# Patient Record
Sex: Male | Born: 1985 | Race: Black or African American | Hispanic: No | Marital: Married | Smoking: Current every day smoker
Health system: Southern US, Community
[De-identification: ages and names within clinical notes are randomized; demographics above are authoritative.]

## PROBLEM LIST (undated history)

## (undated) DIAGNOSIS — I10 Essential (primary) hypertension: Secondary | ICD-10-CM

## (undated) DIAGNOSIS — I509 Heart failure, unspecified: Secondary | ICD-10-CM

## (undated) DIAGNOSIS — G4733 Obstructive sleep apnea (adult) (pediatric): Secondary | ICD-10-CM

## (undated) HISTORY — DX: Morbid (severe) obesity due to excess calories: E66.01

## (undated) HISTORY — PX: NOSE SURGERY: SHX723

## (undated) HISTORY — DX: Obstructive sleep apnea (adult) (pediatric): G47.33

---

## 2016-11-15 ENCOUNTER — Emergency Department (HOSPITAL_COMMUNITY): Payer: BLUE CROSS/BLUE SHIELD

## 2016-11-15 ENCOUNTER — Encounter (HOSPITAL_COMMUNITY): Payer: Self-pay | Admitting: Emergency Medicine

## 2016-11-15 ENCOUNTER — Emergency Department (HOSPITAL_COMMUNITY)
Admission: EM | Admit: 2016-11-15 | Discharge: 2016-11-16 | Disposition: A | Discharge status: Home / Self Care | Payer: BLUE CROSS/BLUE SHIELD | Attending: Emergency Medicine | Admitting: Emergency Medicine

## 2016-11-15 DIAGNOSIS — R079 Chest pain, unspecified: Secondary | ICD-10-CM | POA: Diagnosis not present

## 2016-11-15 DIAGNOSIS — I10 Essential (primary) hypertension: Secondary | ICD-10-CM | POA: Insufficient documentation

## 2016-11-15 DIAGNOSIS — F17200 Nicotine dependence, unspecified, uncomplicated: Secondary | ICD-10-CM | POA: Insufficient documentation

## 2016-11-15 HISTORY — DX: Essential (primary) hypertension: I10

## 2016-11-15 LAB — CBC WITH DIFFERENTIAL/PLATELET
Basophils Absolute: 0 10*3/uL (ref 0.0–0.1)
Basophils Relative: 0 %
EOS ABS: 0.2 10*3/uL (ref 0.0–0.7)
Eosinophils Relative: 3 %
HCT: 40.9 % (ref 39.0–52.0)
HEMOGLOBIN: 14.2 g/dL (ref 13.0–17.0)
LYMPHS ABS: 3 10*3/uL (ref 0.7–4.0)
LYMPHS PCT: 35 %
MCH: 30.1 pg (ref 26.0–34.0)
MCHC: 34.7 g/dL (ref 30.0–36.0)
MCV: 86.8 fL (ref 78.0–100.0)
Monocytes Absolute: 0.8 10*3/uL (ref 0.1–1.0)
Monocytes Relative: 10 %
NEUTROS PCT: 52 %
Neutro Abs: 4.4 10*3/uL (ref 1.7–7.7)
Platelets: 353 10*3/uL (ref 150–400)
RBC: 4.71 MIL/uL (ref 4.22–5.81)
RDW: 13.1 % (ref 11.5–15.5)
WBC: 8.5 10*3/uL (ref 4.0–10.5)

## 2016-11-15 LAB — I-STAT TROPONIN, ED: TROPONIN I, POC: 0.04 ng/mL (ref 0.00–0.08)

## 2016-11-15 MED ORDER — NITROGLYCERIN 0.4 MG SL SUBL
0.4000 mg | SUBLINGUAL_TABLET | SUBLINGUAL | Status: DC | PRN
  Administered 2016-11-15: 0.4 mg via SUBLINGUAL
  Filled 2016-11-15: qty 1

## 2016-11-15 NOTE — ED Provider Notes (Signed)
[facility]-EMERGENCY DEPT Provider Note   CSN: [id number] Arrival date & time: 11/15/16  2258   By signing my name below, I, Clarisse Gouge, attest that this documentation has been prepared under the direction and in the presence of Alvira Monday, MD. Electronically signed, Clarisse Gouge, ED Scribe. 11/15/16. 11:40 PM.   History   Chief Complaint Chief Complaint  Patient presents with  . Chest Pain   The history is provided by the patient and medical records. No language interpreter was used.    HPI Comments: Richard Hale is a 31 y.o. male who presents to the Emergency Department complaining of constant central chest pain x 2 days. He describes the pain as stabbing and notes the pain is radiating across the chest. He adds the chest pain is improved with with applying pressure to the affected area, and he states his pain is unchanged with exertion or position change. He notes Hx of similar chest pain attributed to HTN. Pt adds he takes prescribed HTN medications, but he stopped taking lisinopril because it gave him a dry cough ~6 months ago. Pt reports associated diaphoresis and nausea. He notes these symptoms subsided after stepping outside for "a breath of fresh air" at home. Pt is a smoker. He adds his mother has a Hx of arrythmia. Pt denies any drug use, Hx of blood clots in the legs or any surgeries, leg pain or swelling, fever, cough or rhinorrhea. NKDA.    Past Medical History:  Diagnosis Date  . Hypertension     There are no active problems to display for this patient.   Past Surgical History:  Procedure Laterality Date  . NOSE SURGERY         Home Medications    Prior to Admission medications   Not on File    Family History No family history on file.  Social History Social History  Substance Use Topics  . Smoking status: Current Every Day Smoker  . Smokeless tobacco: Not on file  . Alcohol use Not on file     Allergies   Patient has no known  allergies.   Review of Systems Review of Systems  Constitutional: Positive for diaphoresis. Negative for fever.  HENT: Negative for sore throat.   Eyes: Negative for visual disturbance.  Respiratory: Positive for cough (mild chronic unchanged). Negative for shortness of breath.   Cardiovascular: Positive for chest pain. Negative for leg swelling.  Gastrointestinal: Positive for nausea. Negative for abdominal pain, constipation, diarrhea and vomiting.  Genitourinary: Negative for difficulty urinating.  Musculoskeletal: Negative for back pain and neck stiffness.  Skin: Negative for rash.  Neurological: Negative for syncope and headaches.     Physical Exam Updated Vital Signs BP (!) 191/120   Temp 98.3 F (36.8 C) (Oral)   Resp 13   SpO2 99%   Physical Exam  Constitutional: He is oriented to person, place, and time. He appears well-developed and well-nourished. No distress.  HENT:  Head: Normocephalic and atraumatic.  Eyes: Conjunctivae and EOM are normal.  Neck: Normal range of motion.  Cardiovascular: Normal rate, regular rhythm, normal heart sounds and intact distal pulses.  Exam reveals no gallop and no friction rub.   No murmur heard. Equal upper and lower ext pulses  Pulmonary/Chest: Effort normal and breath sounds normal. No respiratory distress. He has no wheezes. He has no rales.  Abdominal: Soft. He exhibits no distension. There is no tenderness. There is no guarding.  Musculoskeletal: He exhibits no edema.  Neurological: He  is alert and oriented to person, place, and time.  Skin: Skin is warm and dry. He is not diaphoretic.  Nursing note and vitals reviewed.    ED Treatments / Results  DIAGNOSTIC STUDIES: Oxygen Saturation is 99% on RA, normal by my interpretation.    COORDINATION OF CARE: 11:25 PM Discussed treatment plan with pt at bedside and pt agreed to plan.  Labs (all labs ordered are listed, but only abnormal results are displayed) Labs Reviewed    CBC WITH DIFFERENTIAL/PLATELET  COMPREHENSIVE METABOLIC PANEL  I-STAT TROPOININ, ED    EKG  EKG Interpretation  Date/Time:  Sunday November 15 2016 23:08:40 EST Ventricular Rate:  90 PR Interval:    QRS Duration: 124 QT Interval:  396 QTC Calculation: 485 R Axis:   -20 Text Interpretation:  Sinus rhythm IVCD, consider atypical RBBB Probable left ventricular hypertrophy Nonspecific T abnormalities, lateral leads ST elev, probable normal early repol pattern Borderline prolonged QT interval Baseline wander in lead(s) V5 V6 No previous ECGs available Confirmed by Megon Kalina MD, Floyde Dingley ([id number]) on 11/15/2016 11:12:46 PM       Radiology No results found.  Procedures Procedures (including critical care time)  Medications Ordered in ED Medications  nitroGLYCERIN (NITROSTAT) SL tablet 0.4 mg (0.4 mg Sublingual Given 11/15/16 2342)     Initial Impression / Assessment and Plan / ED Course  I have reviewed the triage vital signs and the nursing notes.  Pertinent labs & imaging results that were available during my care of the patient were reviewed by me and considered in my medical decision making (see chart for details).     30  year old male with a history of hypertension presents with concern for chest pain starting yesterday. Differential diagnosis for chest pain includes pulmonary embolus, dissection, pneumothorax, pneumonia, ACS, myocarditis, pericarditis.  EKG was done and evaluate by me and showed likely benign early repolarization and no signs of pericarditis. Chest x-ray was done and evaluated by me and radiology and showed no sign of pneumonia or pneumothorax. Patient is PERC negative and low risk Wells and have low suspicion for PE.  Patient is low risk HEART score and had delta troponins which were both negative.  Pt with normal pulses bilaterally, normal CXR doubt dissection. Given this evaluation, history and physical have low suspicion for pulmonary embolus, pneumonia, ACS,  myocarditis, pericarditis, dissection.   He is hypertensive in the ED and was given nitroglycerin, losartan, hctz with improvement in his blood pressures and CP.  Patient discharged in stable condition with understanding of reasons to return and recommend PCP follow up. Will initiate losartan for additional htn medication in setting of patient discontinuing lisinopril for cough.    I personally performed the services described in this documentation, which was scribed in my presence. The recorded information has been reviewed and is accurate.   Final Clinical Impressions(s) / ED Diagnoses   Final diagnoses:  Essential hypertension  Chest pain, unspecified type    New Prescriptions New Prescriptions   No medications on file     Alvira Monday, MD 11/16/16 2308

## 2016-11-15 NOTE — ED Triage Notes (Signed)
Pt c/o mid chest pain onset yesterday with nausea. Denies SOB, light headedness. Pt reports missing one dose of BP medication 2 days ago.

## 2016-11-15 NOTE — ED Notes (Signed)
ED Provider at bedside. 

## 2016-11-15 NOTE — ED Notes (Signed)
Patient transported to X-ray 

## 2016-11-16 LAB — COMPREHENSIVE METABOLIC PANEL
ALBUMIN: 3.9 g/dL (ref 3.5–5.0)
ALK PHOS: 97 U/L (ref 38–126)
ALT: 20 U/L (ref 17–63)
AST: 26 U/L (ref 15–41)
Anion gap: 6 (ref 5–15)
BUN: 15 mg/dL (ref 6–20)
CO2: 23 mmol/L (ref 22–32)
CREATININE: 0.96 mg/dL (ref 0.61–1.24)
Calcium: 8.3 mg/dL — ABNORMAL LOW (ref 8.9–10.3)
Chloride: 103 mmol/L (ref 101–111)
GFR calc Af Amer: >60 mL/min (ref >60)
GFR calc non Af Amer: >60 mL/min (ref >60)
GLUCOSE: 104 mg/dL — AB (ref 65–99)
Potassium: 3.2 mmol/L — ABNORMAL LOW (ref 3.5–5.1)
SODIUM: 132 mmol/L — AB (ref 135–145)
Total Bilirubin: 0.3 mg/dL (ref 0.3–1.2)
Total Protein: 7.3 g/dL (ref 6.5–8.1)

## 2016-11-16 LAB — I-STAT TROPONIN, ED: Troponin i, poc: 0.04 ng/mL (ref 0.00–0.08)

## 2016-11-16 MED ORDER — HYDROCHLOROTHIAZIDE 12.5 MG PO CAPS
25.0000 mg | ORAL_CAPSULE | Freq: Once | ORAL | Status: AC
  Administered 2016-11-16: 25 mg via ORAL
  Filled 2016-11-16: qty 2

## 2016-11-16 MED ORDER — LOSARTAN POTASSIUM 25 MG PO TABS: 25.0000 mg | ORAL_TABLET | Freq: Every day | ORAL | 0 refills | Status: DC

## 2016-11-16 MED ORDER — LOSARTAN POTASSIUM 25 MG PO TABS
25.0000 mg | ORAL_TABLET | Freq: Once | ORAL | Status: AC
  Administered 2016-11-16: 25 mg via ORAL
  Filled 2016-11-16: qty 1

## 2016-11-16 MED ORDER — POTASSIUM CHLORIDE CRYS ER 20 MEQ PO TBCR
40.0000 meq | EXTENDED_RELEASE_TABLET | Freq: Once | ORAL | Status: AC
  Administered 2016-11-16: 40 meq via ORAL
  Filled 2016-11-16: qty 2

## 2017-04-05 ENCOUNTER — Encounter (HOSPITAL_COMMUNITY): Payer: Self-pay

## 2017-04-05 ENCOUNTER — Emergency Department (HOSPITAL_COMMUNITY)
Admission: EM | Admit: 2017-04-05 | Discharge: 2017-04-05 | Disposition: A | Discharge status: Home / Self Care | Payer: BLUE CROSS/BLUE SHIELD | Attending: Emergency Medicine | Admitting: Emergency Medicine

## 2017-04-05 DIAGNOSIS — L304 Erythema intertrigo: Secondary | ICD-10-CM | POA: Diagnosis not present

## 2017-04-05 DIAGNOSIS — F17210 Nicotine dependence, cigarettes, uncomplicated: Secondary | ICD-10-CM | POA: Insufficient documentation

## 2017-04-05 DIAGNOSIS — I10 Essential (primary) hypertension: Secondary | ICD-10-CM | POA: Diagnosis not present

## 2017-04-05 DIAGNOSIS — L02214 Cutaneous abscess of groin: Secondary | ICD-10-CM | POA: Diagnosis not present

## 2017-04-05 LAB — URINALYSIS, ROUTINE W REFLEX MICROSCOPIC
Bacteria, UA: NONE SEEN
Bilirubin Urine: NEGATIVE
GLUCOSE, UA: NEGATIVE mg/dL
HGB URINE DIPSTICK: NEGATIVE
Ketones, ur: NEGATIVE mg/dL
Leukocytes, UA: NEGATIVE
NITRITE: NEGATIVE
PH: 6 (ref 5.0–8.0)
PROTEIN: 30 mg/dL — AB
SPECIFIC GRAVITY, URINE: 1.031 — AB (ref 1.005–1.030)
Squamous Epithelial / LPF: NONE SEEN

## 2017-04-05 MED ORDER — NYSTATIN 100000 UNIT/GM EX POWD: CUTANEOUS | 2 refills | Status: DC

## 2017-04-05 MED ORDER — LOSARTAN POTASSIUM 25 MG PO TABS: 25.0000 mg | ORAL_TABLET | Freq: Every day | ORAL | 0 refills | Status: DC

## 2017-04-05 MED ORDER — DOXYCYCLINE HYCLATE 100 MG PO CAPS: 100.0000 mg | ORAL_CAPSULE | Freq: Two times a day (BID) | ORAL | 0 refills | Status: DC

## 2017-04-05 NOTE — Discharge Instructions (Addendum)
Keep areas clean and dry. Apply warm compresses to affected area on your groin throughout the day. Take antibiotic until it is finished. Use nystatin powder as directed. Make sure to keep the area under your abdomen as dry as possible. Alternate between tylenol and motrin as needed for pain. Your blood pressure was high, take your blood pressure medications as directed, and eat a low salt diet to help control your blood pressure. Followup with a Primary Care doctor (or the clinic listed above) in 1 week for recheck of symptoms and ongoing medical care. Monitor area for signs of infection to include, but not limited to: increasing pain, spreading redness, drainage/pus, worsening swelling, or fevers. Return to emergency department for emergent changing or worsening symptoms.   You have been tested for gonorrhea, chlamydia, HIV, and Syphilis in the ER but the hospital will call you if lab is positive. DO NOT ENGAGE IN SEXUAL ACTIVITY UNTIL YOU FIND OUT ABOUT YOUR RESULTS AND HAVE PARTNERS TESTED AND TREATED. ALL PARTNERS MUST BE TESTED AND TREATED FOR STD'S. ALWAYS USE CONDOMS WHEN ENGAGING IN INTERCOURSE. Follow up with [facility] Department STD clinic for future STD concerns or screenings. This is the recommendation by the CDC for people with multiple sexual partners or history of STDs.

## 2017-04-05 NOTE — ED Provider Notes (Signed)
[facility] DEPT Provider Note   CSN: [id number] Arrival date & time: 04/05/17  1217  By signing my name below, I, Richard Hale, attest that this documentation has been prepared under the direction and in the presence of non-physician practitioner, [address] PA-C. Electronically Signed: Modena Hale, Scribe. 04/05/2017. 1:59 PM.  History   Chief Complaint Chief Complaint  Patient presents with  . Abscess  . Hypertension   The history is provided by the patient and medical records. No language interpreter was used.  Abscess  Location:  Pelvis Pelvic abscess location:  Groin Abscess quality: painful   Abscess quality: not draining, no fluctuance, no redness, no warmth and not weeping   Red streaking: no   Duration:  1 day (one area x1 day, other area x92yr) Progression:  Unchanged Pain details:    Quality:  Burning and throbbing   Severity:  Moderate   Duration:  1 day   Timing:  Constant   Progression:  Unchanged Chronicity:  Recurrent Context: not diabetes, not immunosuppression, not insect bite/sting and not skin injury   Relieved by:  Nothing Worsened by:  Nothing Ineffective treatments:  Warm compresses Associated symptoms: no fever, no headaches, no nausea and no vomiting   Risk factors: prior abscess   Hypertension  This is a recurrent problem. Pertinent negatives include no chest pain, no abdominal pain, no headaches and no shortness of breath. Nothing relieves the symptoms. He has tried nothing for the symptoms.    HPI Comments: Richard Hale is a 31 y.o. male with a PMHx of HTN, who presents to the Emergency Department complaining of constant moderate "bumps" under his abdomen and one on his groin; the one on the groin has been there for about 1 year and comes and goes; the one under his abdomen started yesterday. He states the groin bump gets painful and then "never comes to a head" before self-resolving. No drainage, swelling, warmth, or redness/red  streaking. He states the areas are painful; descries the pain as 7/10 constant, throbbing/burning, non-radiating pain to the "bumps", exacerbated by contact with underwear, and unrelieved by warm compress. He reports some mild testicular soreness (but no pain) as well, but thinks it's from the groin bump. He admits to hx of prior abscess. He has had one male sexual partner in the past year, unprotected.   He also complains of HTN. He was in the ED on 11/15/16 for chest pain and HTN, had a reassuring work-up and was discharged home with Losartan 25mg  daily. Mentioned that visit he discontinued Lisinopril due to cough. His BP then was 191/120 at that visit. He never went for recommended follow-up visit, and states that he accidentally lost the rx that was given to him; he is requesting a refill of his medication.   He denies fevers, chills, visual disturbance, HA, lightheadedness, CP, leg swelling, SOB, abd pain, N/V/D/C, hematuria, dysuria, testicular pain or swelling, penile discharge, myalgias, arthralgias, numbness, tingling, focal weakness, or any other complaints at this time.   Past Medical History:  Diagnosis Date  . Hypertension     There are no active problems to display for this patient.   Past Surgical History:  Procedure Laterality Date  . NOSE SURGERY         Home Medications    Prior to Admission medications   Medication Sig Start Date End Date Taking? Authorizing Provider  doxycycline (VIBRAMYCIN) 100 MG capsule Take 1 capsule (100 mg total) by mouth 2 (two) times daily. One po  bid x 7 days 04/05/17   Delano Scardino, [place], PA-C  losartan (COZAAR) 25 MG tablet Take 1 tablet (25 mg total) by mouth daily. 11/16/16   Alvira Monday, MD  losartan (COZAAR) 25 MG tablet Take 1 tablet (25 mg total) by mouth daily. 04/05/17   Abdulkareem Badolato, [place], PA-C  nystatin (MYCOSTATIN/NYSTOP) powder Apply to affected area under abdomen skin three times daily until resolution of yeast infection. 04/05/17    Joseth Weigel, [place], PA-C    Family History History reviewed. No pertinent family history.  Social History Social History  Substance Use Topics  . Smoking status: Current Every Day Smoker    Packs/day: 0.50    Types: Cigarettes  . Smokeless tobacco: Never Used  . Alcohol use Yes     Comment: occ     Allergies   Patient has no known allergies.   Review of Systems Review of Systems  Constitutional: Negative for chills and fever.  Eyes: Negative for visual disturbance.  Respiratory: Negative for shortness of breath.   Cardiovascular: Negative for chest pain and leg swelling.  Gastrointestinal: Negative for abdominal pain, constipation, diarrhea, nausea and vomiting.  Genitourinary: Positive for testicular pain (no pain, but soreness present). Negative for discharge, dysuria, hematuria and scrotal swelling.  Musculoskeletal: Negative for arthralgias and myalgias.  Skin: Positive for wound.  Allergic/Immunologic: Negative for immunocompromised state.  Neurological: Negative for weakness, light-headedness, numbness and headaches.  Psychiatric/Behavioral: Negative for confusion.  All other systems reviewed and are negative for acute change except as noted in the HPI.   Physical Exam Updated Vital Signs BP (!) 184/133 (BP Location: Left Arm)   Pulse (!) 104   Temp 97.8 F (36.6 C) (Oral)   Resp 18   Ht 6\' 2"  (1.88 m)   Wt (!) 313 lb (142 kg)   SpO2 98%   BMI 40.19 kg/m   Physical Exam  Constitutional: He is oriented to person, place, and time. Vital signs are normal. He appears well-developed and well-nourished.  Non-toxic appearance. No distress.  Afebrile, nontoxic, NAD, mild HTN noted which is similar to prior visits.   HENT:  Head: Normocephalic and atraumatic.  Mouth/Throat: Mucous membranes are normal.  Eyes: Conjunctivae and EOM are normal. Right eye exhibits no discharge. Left eye exhibits no discharge.  Neck: Normal range of motion. Neck supple.    Cardiovascular: Normal rate and intact distal pulses.   Pulmonary/Chest: Effort normal. No respiratory distress.  Abdominal: Normal appearance. He exhibits no distension. Hernia confirmed negative in the right inguinal area and confirmed negative in the left inguinal area.  Genitourinary: Testes normal and penis normal. Cremasteric reflex is present. Right testis shows no mass, no swelling and no tenderness. Left testis shows no mass, no swelling and no tenderness. Circumcised. No phimosis, paraphimosis, hypospadias, penile erythema or penile tenderness. No discharge found.     Genitourinary Comments: Chaperone present for exam Circumcised penis without phimosis/paraphimosis, hypospadias, erythema, tenderness, or discharge. Testes with no masses or tenderness, no swelling, and cremasterics reflex present bilaterally. No abnormal lie. No inguinal hernias present.  Small ?cyst vs lymph node on the left mons region near the inguinal fold, small abraded area over top, no fluctuance or surrounding cellulitis, no drainage, no erythema or warmth, no swelling, seems mobile, mildly tender. No other rashes or genital lesions. No ulcerations.  Lower abdominal intertriginous area with slightly macerated skin, no warmth/erythema. No evidence of cellulitis or abscess to this region.   Musculoskeletal: Normal range of motion.  Lymphadenopathy: Inguinal adenopathy noted on  the left side.  Neurological: He is alert and oriented to person, place, and time. He has normal strength. No sensory deficit.  Skin: Skin is warm, dry and intact. No rash noted.  See GU exam.   Psychiatric: He has a normal mood and affect.  Nursing note and vitals reviewed.    ED Treatments / Results  DIAGNOSTIC STUDIES: Oxygen Saturation is 98% on RA, normal by my interpretation.    COORDINATION OF CARE: 2:03 PM- Pt advised of plan for treatment and pt agrees.  Labs (all labs ordered are listed, but only abnormal results are  displayed) Labs Reviewed  URINALYSIS, ROUTINE W REFLEX MICROSCOPIC - Abnormal; Notable for the following:       Result Value   Specific Gravity, Urine 1.031 (*)    Protein, ur 30 (*)    All other components within normal limits  RPR  HIV ANTIBODY (ROUTINE TESTING)  GC/CHLAMYDIA PROBE AMP ([place]) NOT AT [facility]    EKG  EKG Interpretation None       Radiology No results found.  Procedures Procedures (including critical care time)  Medications Ordered in ED Medications - No data to display   Initial Impression / Assessment and Plan / ED Course  I have reviewed the triage vital signs and the nursing notes.  Pertinent labs & imaging results that were available during my care of the patient were reviewed by me and considered in my medical decision making (see chart for details).     31 y.o. male here with two areas on his skin that he's concerned with; one under his belly fold in the intertrigonous region, which appears to be c/w intertrigo candidal infection but no evidence of secondary bacterial infection. Other area has been present for a year, on the L groin/mons/inguinal fold region, seems like a cyst vs lymph node; doesn't appear to be an abscess nor does it have any surrounding cellulitis. Has mentioned some mild testicular soreness, no tenderness on exam. Area on the groin doesn't look like syphilis chancre, nor does it appear to be lymphogranuloma venereum. Given that it's painful, and has abraded skin overtop, will treat for possible infection using doxycycline, which would cover for some of the STD bugs anyway. Will get STD check today. U/A unremarkable. Doubt need for empiric GC/CT treatment given lack of clinical findings or symptoms. Will use nystatin for the intertrigo, advised keeping area clean and dry. Pt also mentions that he lost his HTN med rx from last visit, hasn't established care yet; completely asymptomatic for HTN, doubt need for repeat work up (had one  11/15/16), will refill med as one time courtesy but discussed importance of establishing care. Advised DASH diet as well. [facility] and to establish care. F/up with health dept for future STD concerns. I explained the diagnosis and have given explicit precautions to return to the ER including for any other new or worsening symptoms. The patient understands and accepts the medical plan as it's been dictated and I have answered their questions. Discharge instructions concerning home care and prescriptions have been given. The patient is STABLE and is discharged to home in good condition.   I personally performed the services described in this documentation, which was scribed in my presence. The recorded information has been reviewed and is accurate.   Final Clinical Impressions(s) / ED Diagnoses   Final diagnoses:  Intertrigo  Groin abscess  Essential hypertension    New Prescriptions New Prescriptions  DOXYCYCLINE (VIBRAMYCIN) 100 MG CAPSULE    Take 1 capsule (100 mg total) by mouth 2 (two) times daily. One po bid x 7 days   LOSARTAN (COZAAR) 25 MG TABLET    Take 1 tablet (25 mg total) by mouth daily.   NYSTATIN (MYCOSTATIN/NYSTOP) POWDER    Apply to affected area under abdomen skin three times daily until resolution of yeast infection.      [address] [place], [place] 04/05/17 1517    Jacalyn Lefevre, MD 04/05/17 [phone]

## 2017-04-05 NOTE — ED Triage Notes (Signed)
Pt c/o abscesses x 2 on lower abdomen/pelvic area.  Pt reports one has "been there for like a year" and the other "just popped up."  Pain score 6/10.  Pt reports applying warm compress w/o relief.  Also, Pt is concerned about HTN.  Sts he was previously prescribed medication, but never followed up w/ PCP.

## 2017-04-06 LAB — GC/CHLAMYDIA PROBE AMP (~~LOC~~) NOT AT ARMC
Chlamydia: NEGATIVE
Neisseria Gonorrhea: NEGATIVE

## 2017-04-06 LAB — RPR: RPR: NONREACTIVE

## 2017-04-06 LAB — HIV ANTIBODY (ROUTINE TESTING W REFLEX): HIV Screen 4th Generation wRfx: NONREACTIVE

## 2017-04-15 ENCOUNTER — Inpatient Hospital Stay (INDEPENDENT_AMBULATORY_CARE_PROVIDER_SITE_OTHER): Payer: BLUE CROSS/BLUE SHIELD | Admitting: Physician Assistant

## 2017-04-23 ENCOUNTER — Ambulatory Visit (INDEPENDENT_AMBULATORY_CARE_PROVIDER_SITE_OTHER): Payer: BLUE CROSS/BLUE SHIELD | Admitting: Physician Assistant

## 2017-04-23 ENCOUNTER — Ambulatory Visit (HOSPITAL_COMMUNITY): Admission: RE | Admit: 2017-04-23 | Payer: BLUE CROSS/BLUE SHIELD | Source: Ambulatory Visit

## 2017-04-23 ENCOUNTER — Encounter (HOSPITAL_COMMUNITY): Payer: Self-pay | Admitting: Emergency Medicine

## 2017-04-23 ENCOUNTER — Emergency Department (HOSPITAL_COMMUNITY)
Admission: EM | Admit: 2017-04-23 | Discharge: 2017-04-23 | Discharge status: Against Medical Advice | Payer: BLUE CROSS/BLUE SHIELD | Attending: Emergency Medicine | Admitting: Emergency Medicine

## 2017-04-23 ENCOUNTER — Emergency Department (HOSPITAL_COMMUNITY): Payer: BLUE CROSS/BLUE SHIELD

## 2017-04-23 ENCOUNTER — Encounter (INDEPENDENT_AMBULATORY_CARE_PROVIDER_SITE_OTHER): Payer: Self-pay | Admitting: Physician Assistant

## 2017-04-23 VITALS — BP 167/124 | HR 82 | Temp 98.2°F | Ht 73.0 in | Wt 369.8 lb

## 2017-04-23 DIAGNOSIS — R7303 Prediabetes: Secondary | ICD-10-CM | POA: Diagnosis not present

## 2017-04-23 DIAGNOSIS — R079 Chest pain, unspecified: Secondary | ICD-10-CM | POA: Diagnosis not present

## 2017-04-23 DIAGNOSIS — I10 Essential (primary) hypertension: Secondary | ICD-10-CM | POA: Diagnosis not present

## 2017-04-23 DIAGNOSIS — F17210 Nicotine dependence, cigarettes, uncomplicated: Secondary | ICD-10-CM | POA: Diagnosis not present

## 2017-04-23 DIAGNOSIS — R9431 Abnormal electrocardiogram [ECG] [EKG]: Secondary | ICD-10-CM | POA: Diagnosis not present

## 2017-04-23 DIAGNOSIS — R739 Hyperglycemia, unspecified: Secondary | ICD-10-CM

## 2017-04-23 LAB — I-STAT CHEM 8, ED
BUN: 13 mg/dL (ref 6–20)
CHLORIDE: 104 mmol/L (ref 101–111)
Calcium, Ion: 1.18 mmol/L (ref 1.15–1.40)
Creatinine, Ser: 0.8 mg/dL (ref 0.61–1.24)
GLUCOSE: 117 mg/dL — AB (ref 65–99)
HCT: 39 % (ref 39.0–52.0)
Hemoglobin: 13.3 g/dL (ref 13.0–17.0)
POTASSIUM: 3.4 mmol/L — AB (ref 3.5–5.1)
Sodium: 142 mmol/L (ref 135–145)
TCO2: 26 mmol/L (ref 0–100)

## 2017-04-23 LAB — POCT GLYCOSYLATED HEMOGLOBIN (HGB A1C): Hemoglobin A1C: 6

## 2017-04-23 LAB — I-STAT TROPONIN, ED: Troponin i, poc: 0.04 ng/mL (ref 0.00–0.08)

## 2017-04-23 MED ORDER — HYDROCHLOROTHIAZIDE 25 MG PO TABS: 25.0000 mg | ORAL_TABLET | Freq: Every day | ORAL | 1 refills | Status: DC

## 2017-04-23 MED ORDER — POTASSIUM CHLORIDE CRYS ER 20 MEQ PO TBCR: 40.0000 meq | EXTENDED_RELEASE_TABLET | Freq: Once | ORAL | Status: DC

## 2017-04-23 MED ORDER — CLONIDINE HCL 0.1 MG PO TABS
0.2000 mg | ORAL_TABLET | Freq: Once | ORAL | Status: AC
  Administered 2017-04-23: 0.2 mg via ORAL

## 2017-04-23 MED ORDER — LOSARTAN POTASSIUM 50 MG PO TABS: 50.0000 mg | ORAL_TABLET | Freq: Every day | ORAL | 3 refills | Status: DC

## 2017-04-23 NOTE — Progress Notes (Signed)
Subjective:  Patient ID: Richard Hale, male    DOB: 1986-06-20  Age: 31 y.o. MRN: [id number]  CC: HTN  HPI Richard Hale is a 31 y.o. male with a PMH of HTN presents as a new patient to address hypertension. BP 210/148 in clinic today. Has been taking Losartan 25 mg daily with no reduction of BP. Denies side effect with Losartan.  Had left sided chest pain two days ago while at work. Attributed to work activities at the [employer]. Feels like a stabbing pain from inside out. Pain is constant throughout the day. Ameliorated with rest. CP associated with diaphoresis. Has also noted some tingling, and numbness of the hands and feet. Chest pain is non-radicular, not associated with eating/drinking. Has no current cardiovascular, pulmonary, neurological, or constitutional symptoms.      ROS Review of Systems  Constitutional: Negative for chills, fever and malaise/fatigue.  Eyes: Negative for blurred vision.  Respiratory: Negative for shortness of breath.   Cardiovascular: Positive for chest pain (two days ago). Negative for palpitations.  Gastrointestinal: Negative for abdominal pain and nausea.  Genitourinary: Negative for dysuria and hematuria.  Musculoskeletal: Negative for joint pain and myalgias.  Skin: Negative for rash.  Neurological: Negative for tingling and headaches.  Psychiatric/Behavioral: Negative for depression. The patient is not nervous/anxious.     Objective:  BP (!) 210/148 (BP Location: Right Arm, Patient Position: Sitting, Cuff Size: Large)   Pulse 82   Temp 98.2 F (36.8 C) (Oral)   Ht 6\' 1"  (1.854 m)   Wt (!) 369 lb 12.8 oz (167.7 kg)   SpO2 99%   BMI 48.79 kg/m   BP/Weight 04/23/2017 04/05/2017 11/16/2016  Systolic BP 210 159 172  Diastolic BP 148 120 107  Wt. (Lbs) 369.8 313 -  BMI 48.79 40.19 -      Physical Exam  Constitutional: He is oriented to person, place, and time.  Well developed, obese, NAD, polite  HENT:  Head: Normocephalic and  atraumatic.  Eyes: No scleral icterus.  Neck: Normal range of motion. Neck supple. No thyromegaly present.  Cardiovascular: Normal rate, regular rhythm and normal heart sounds.   Pulmonary/Chest: Effort normal and breath sounds normal.  Musculoskeletal: He exhibits no edema.  Neurological: He is alert and oriented to person, place, and time. No cranial nerve deficit. Coordination normal.  Skin: Skin is warm and dry. No rash noted. No erythema. No pallor.  Psychiatric: He has a normal mood and affect. His behavior is normal. Thought content normal.  Vitals reviewed.    Assessment & Plan:   1. Hypertension, unspecified type - Administered Clonidine 0.2 mg in clinic today - Lipid Panel - CBC with Differential - Comprehensive metabolic panel - TSH - Begin hydrochlorothiazide (HYDRODIURIL) 25 MG tablet; Take 1 tablet (25 mg total) by mouth daily. Take on tablet in the morning.  Dispense: 90 tablet; Refill: 1 - Begin losartan (COZAAR) 50 MG tablet; Take 1 tablet (50 mg total) by mouth daily.  Dispense: 90 tablet; Refill: 3 - Sent patient to the ED after Clonidine 0.2 mg failed to reduce blood pressure sufficiently. Will review ED chart once sent to me and call patient for a f/u.   2. Chest pain, unspecified type - EKG machine/software faulty. Sent to ED.    3. Hyperglycemia - HgB A1c 6.0% in clinic today   Meds ordered this encounter  Medications  . hydrochlorothiazide (HYDRODIURIL) 25 MG tablet    Sig: Take 1 tablet (25 mg total) by mouth  daily. Take on tablet in the morning.    Dispense:  90 tablet    Refill:  1    Order Specific Question:   Supervising Provider    Answer:   Quentin Angst [id number]  . losartan (COZAAR) 50 MG tablet    Sig: Take 1 tablet (50 mg total) by mouth daily.    Dispense:  90 tablet    Refill:  3    Order Specific Question:   Supervising Provider    Answer:   Quentin Angst [[id number]]    Follow-up: 4 weeks for HTN  Loletta Specter  PA

## 2017-04-23 NOTE — ED Provider Notes (Signed)
[facility]-EMERGENCY DEPT Provider Note   CSN: [id number] Arrival date & time: 04/23/17  1340     History   Chief Complaint Chief Complaint  Patient presents with  . Hypertension    HPI Richard Hale is a 31 y.o. male.  HPI 31 year old past medical history significant for hypertension presents to the ED today after referral from primary care doctor for elevated blood pressure. Patient has history of hypertension and has been taking his losartan 25 mg with no reduction in his blood pressure. Patient states that 2 days ago he had an episode of left-sided chest pain that does not radiate. Not associated with diaphoresis, exertion, shortness of breath, nausea, vomiting. This self resolved. He attributed it to working activities at this still [employer]. Patient describes it as a stabbing pain. The patient that time did have some intermittent dizziness. States he has not had any further symptoms. Patient states he has not taken his blood pressure medicine for the past day because he was going to a new primary care doctor and he wanted to show them that his blood pressure is not controlled with the medication. While at the doctor's office his blood pressure was noted to be 210/148. They gave him his home dose of medication and clonidine. They also up to his losartan and added on hydrochlorothiazide. Patient denies any symptoms at this time. States all the symptoms were 2 days ago has had none since. Patient denies any cardiac history. He denies any history of PE/DVT, prolonged immobilization, recent hospitalizations/surgeries, lower extremity edema or calf tenderness.  Pt denies any fever, chill, ha, vision changes, lightheadedness, congestion, neck pain, sobcough, abd pain, n/v/d, urinary symptoms, change in bowel habits, melena, hematochezia, lower extremity paresthesias.    Past Medical History:  Diagnosis Date  . Hypertension     There are no active problems to display for this patient.   Past  Surgical History:  Procedure Laterality Date  . NOSE SURGERY         Home Medications    Prior to Admission medications   Medication Sig Start Date End Date Taking? Authorizing Provider  hydrochlorothiazide (HYDRODIURIL) 25 MG tablet Take 1 tablet (25 mg total) by mouth daily. Take on tablet in the morning. 04/23/17   Loletta Specter, PA-C  losartan (COZAAR) 50 MG tablet Take 1 tablet (50 mg total) by mouth daily. 04/23/17   Loletta Specter, PA-C    Family History History reviewed. No pertinent family history.  Social History Social History  Substance Use Topics  . Smoking status: Current Every Day Smoker    Packs/day: 0.50    Types: Cigarettes  . Smokeless tobacco: Never Used  . Alcohol use Yes     Comment: occ     Allergies   Lisinopril   Review of Systems Review of Systems  Constitutional: Negative for chills, diaphoresis and fever.  HENT: Negative for congestion.   Eyes: Negative for visual disturbance.  Respiratory: Negative for cough and shortness of breath.   Cardiovascular: Positive for chest pain. Negative for palpitations and leg swelling.  Gastrointestinal: Negative for abdominal pain, diarrhea, nausea and vomiting.  Genitourinary: Negative for dysuria, flank pain, frequency, hematuria and urgency.  Musculoskeletal: Negative for arthralgias and myalgias.  Skin: Negative for rash.  Neurological: Positive for dizziness. Negative for syncope, weakness, light-headedness, numbness and headaches.  Psychiatric/Behavioral: Negative for sleep disturbance. The patient is not nervous/anxious.      Physical Exam Updated Vital Signs BP (!) 155/94 (BP Location: Left Arm)  Pulse 81   Temp 97.9 F (36.6 C) (Oral)   Resp 16   SpO2 98%   Physical Exam  Constitutional: He is oriented to person, place, and time. He appears well-developed and well-nourished.  Non-toxic appearance. No distress.  HENT:  Head: Normocephalic and atraumatic.  Nose: Nose normal.    Mouth/Throat: Oropharynx is clear and moist.  Eyes: Pupils are equal, round, and reactive to light. Conjunctivae and EOM are normal. Right eye exhibits no discharge. Left eye exhibits no discharge.  Neck: Normal range of motion. Neck supple. No JVD present. No tracheal deviation present.  Cardiovascular: Normal rate, regular rhythm, normal heart sounds and intact distal pulses.   Pulses are equal and strong in all extremities bilaterally. Extremities are warm to touch bilaterally.  Pulmonary/Chest: Effort normal and breath sounds normal. No respiratory distress. He exhibits no tenderness.  No hypoxia or tachypnea.  Abdominal: Soft. Bowel sounds are normal. He exhibits no distension. There is no tenderness. There is no rebound and no guarding.  Musculoskeletal: Normal range of motion.  No lower extremity edema or calf tenderness.  Lymphadenopathy:    He has no cervical adenopathy.  Neurological: He is alert and oriented to person, place, and time.  The patient is alert, attentive, and oriented x 3. Speech is clear. Cranial nerve II-VII grossly intact. Negative pronator drift. Sensation intact. Strength 5/5 in all extremities. Reflexes 2+ and symmetric at biceps, triceps, knees, and ankles. Rapid alternating movement and fine finger movements intact. Romberg is absent. Posture and gait normal.   Skin: Skin is warm and dry. Capillary refill takes less than 2 seconds. He is not diaphoretic.  Psychiatric: His behavior is normal. Judgment and thought content normal.  Nursing note and vitals reviewed.    ED Treatments / Results  Labs (all labs ordered are listed, but only abnormal results are displayed) Labs Reviewed  I-STAT CHEM 8, ED - Abnormal; Notable for the following:       Result Value   Potassium 3.4 (*)    Glucose, Bld 117 (*)    All other components within normal limits  I-STAT TROPOININ, ED    EKG  EKG Interpretation None       Radiology Dg Chest 2 View  Result Date:  04/23/2017 CLINICAL DATA:  Elevated blood pressure today, LEFT mid chest pain 2 days ago, smoker, history hypertension EXAM: CHEST  2 VIEW COMPARISON:  11/15/2016 FINDINGS: Upper normal size of cardiac silhouette. Mediastinal contours and pulmonary vascularity normal. Mild chronic peribronchial thickening. No acute infiltrate, pleural effusion or pneumothorax. Bones unremarkable. IMPRESSION: Mild chronic bronchitic changes. No acute abnormalities. Electronically Signed   By: Ulyses Southward M.D.   On: 04/23/2017 16:46    Procedures Procedures (including critical care time)  Medications Ordered in ED Medications  potassium chloride SA (K-DUR,KLOR-CON) CR tablet 40 mEq (not administered)     Initial Impression / Assessment and Plan / ED Course  I have reviewed the triage vital signs and the nursing notes.  Pertinent labs & imaging results that were available during my care of the patient were reviewed by me and considered in my medical decision making (see chart for details).     Patient presents to the ED after referral from primary care doctor for hypertension. Patient with history of same uncontrolled on blood pressure medicine. His PCP did change his medications today. Patient is asymptomatic today. He does report episode of chest pain and dizziness 2 days ago but this resolved on its own. No  complaints since. Patient's pulses are equal and strong bilaterally in all extremities. Extremities are warm to touch. He denies any complaints at this time. EKG does show some new T-wave inversions. Troponin was negative. Creatinine is normal. Chest x-ray unremarkable. Discussed with Dr. Mayford Knife with cardiology given new T-wave inversions. She recommends admission for echo and trending troponins. Also had CT of head pending due to patient's dizziness 2 days ago. Discussed admission with patient he states that he has to go because he has Always Taken Care Of. He Is Refusing to Stay. Refuses to Get a Head CT. I  Have Low Suspicion for Aortic Dissection at This Time. Blood pressure has responded to blood pressure treatment office.  Pt presents to the ED for elevated bp. They have decided to leave AMA. I have discussed my concerns as a provider and the possibility that this may worsen. We discussed the nature, risks and benefits, and alternatives to treatment. I have specifically discussed that without further evaluation I cannot guarantee there is not a life threatening event occuring.  Time was given to allow the opportunity to ask questions and consider the options, and after the discussion, the patient decided to refuse the offered treatment. Pt is A&Ox4, his own POA and states understanding of my concerns and the possible consequences. After refusal, I made every reasonable opportunity to treat them to the best of my ability. I have made the patient aware that this is an AMA discharge, but they may return at any time for further evaluation and treatment.   Final Clinical Impressions(s) / ED Diagnoses   Final diagnoses:  Hypertension, unspecified type  EKG abnormalities    New Prescriptions Discharge Medication List as of 04/23/2017  4:45 PM       Rise Mu, PA-C 04/23/17 1710    Shaune Pollack, MD 04/25/17 1136

## 2017-04-23 NOTE — Patient Instructions (Signed)

## 2017-04-23 NOTE — ED Triage Notes (Signed)
Pt sent from PCP for HTN with BP 167/124. Pt asymptomatic. Pt takes losartan for HTN. PCP increased losartan dose and added HCTZ prescription today, had one dose in office.

## 2017-04-23 NOTE — ED Notes (Signed)
Pt leaving AMA. He states that he has to go to pick up his children. PA is aware.

## 2017-04-24 LAB — CBC WITH DIFFERENTIAL/PLATELET
BASOS: 1 %
Basophils Absolute: 0 10*3/uL (ref 0.0–0.2)
EOS (ABSOLUTE): 0.3 10*3/uL (ref 0.0–0.4)
EOS: 4 %
HEMATOCRIT: 39.1 % (ref 37.5–51.0)
HEMOGLOBIN: 13.5 g/dL (ref 13.0–17.7)
IMMATURE GRANS (ABS): 0 10*3/uL (ref 0.0–0.1)
Immature Granulocytes: 0 %
LYMPHS: 43 %
Lymphocytes Absolute: 2.8 10*3/uL (ref 0.7–3.1)
MCH: 29.7 pg (ref 26.6–33.0)
MCHC: 34.5 g/dL (ref 31.5–35.7)
MCV: 86 fL (ref 79–97)
MONOCYTES: 8 %
Monocytes Absolute: 0.5 10*3/uL (ref 0.1–0.9)
NEUTROS ABS: 2.9 10*3/uL (ref 1.4–7.0)
Neutrophils: 44 %
Platelets: 343 10*3/uL (ref 150–379)
RBC: 4.55 x10E6/uL (ref 4.14–5.80)
RDW: 14 % (ref 12.3–15.4)
WBC: 6.5 10*3/uL (ref 3.4–10.8)

## 2017-04-24 LAB — COMPREHENSIVE METABOLIC PANEL
A/G RATIO: 1.3 (ref 1.2–2.2)
ALBUMIN: 4.2 g/dL (ref 3.5–5.5)
ALT: 18 IU/L (ref 0–44)
AST: 24 IU/L (ref 0–40)
Alkaline Phosphatase: 111 IU/L (ref 39–117)
BUN / CREAT RATIO: 14 (ref 9–20)
BUN: 13 mg/dL (ref 6–20)
CHLORIDE: 104 mmol/L (ref 96–106)
CO2: 24 mmol/L (ref 20–29)
Calcium: 9.1 mg/dL (ref 8.7–10.2)
Creatinine, Ser: 0.95 mg/dL (ref 0.76–1.27)
GFR calc non Af Amer: 107 mL/min/{1.73_m2} (ref >59)
GFR, EST AFRICAN AMERICAN: 124 mL/min/{1.73_m2} (ref >59)
Globulin, Total: 3.2 g/dL (ref 1.5–4.5)
Glucose: 92 mg/dL (ref 65–99)
POTASSIUM: 3.6 mmol/L (ref 3.5–5.2)
Sodium: 141 mmol/L (ref 134–144)
TOTAL PROTEIN: 7.4 g/dL (ref 6.0–8.5)

## 2017-04-24 LAB — LIPID PANEL
CHOLESTEROL TOTAL: 123 mg/dL (ref 100–199)
Chol/HDL Ratio: 2.5 ratio (ref 0.0–5.0)
HDL: 49 mg/dL (ref >39)
LDL CALC: 54 mg/dL (ref 0–99)
Triglycerides: 98 mg/dL (ref 0–149)
VLDL Cholesterol Cal: 20 mg/dL (ref 5–40)

## 2017-04-24 LAB — TSH: TSH: 0.795 u[IU]/mL (ref 0.450–4.500)

## 2017-04-26 ENCOUNTER — Telehealth (INDEPENDENT_AMBULATORY_CARE_PROVIDER_SITE_OTHER): Payer: Self-pay

## 2017-04-26 NOTE — Telephone Encounter (Signed)
-----   Message from Loletta Specter, PA-C sent at 04/26/2017 11:46 AM EDT ----- All labs normal.

## 2017-04-26 NOTE — Telephone Encounter (Signed)
Left message for patient. Maryjean Morn, CMA

## 2017-04-27 ENCOUNTER — Telehealth (INDEPENDENT_AMBULATORY_CARE_PROVIDER_SITE_OTHER): Payer: Self-pay | Admitting: Physician Assistant

## 2017-04-27 NOTE — Telephone Encounter (Signed)
FWD to PCP. Deziya Amero S Monia Timmers, CMA  

## 2017-04-27 NOTE — Telephone Encounter (Signed)
Patient called back stated has appt today at 3 pm for wisdom tooth to be pulled out, but dentist will not pull tooth if he does not have letter.

## 2017-04-27 NOTE — Telephone Encounter (Signed)
Patient called stated needs letter stating blood pressure usually high, and does have medication for bp. And ok for dentist to evaluate and treat patient.  Dentist declined services today due to high blood pressure.  Patient stated have dental pain.  Please follow up with patient

## 2017-04-28 NOTE — Telephone Encounter (Signed)
I have seen patient once on 04/23/17 and had to send him out to ED because of his high BP. He needs to f/u and demonstrate control of his BP before I can clear him.

## 2017-05-21 ENCOUNTER — Ambulatory Visit (INDEPENDENT_AMBULATORY_CARE_PROVIDER_SITE_OTHER): Payer: BLUE CROSS/BLUE SHIELD | Admitting: Physician Assistant

## 2017-05-21 ENCOUNTER — Encounter (INDEPENDENT_AMBULATORY_CARE_PROVIDER_SITE_OTHER): Payer: Self-pay | Admitting: Physician Assistant

## 2017-05-21 VITALS — BP 173/129 | HR 88 | Temp 98.6°F | Wt 368.6 lb

## 2017-05-21 DIAGNOSIS — G43809 Other migraine, not intractable, without status migrainosus: Secondary | ICD-10-CM | POA: Diagnosis not present

## 2017-05-21 DIAGNOSIS — I10 Essential (primary) hypertension: Secondary | ICD-10-CM | POA: Diagnosis not present

## 2017-05-21 MED ORDER — CLONIDINE HCL 0.1 MG PO TABS
0.2000 mg | ORAL_TABLET | Freq: Once | ORAL | Status: AC
  Administered 2017-05-21: 0.2 mg via ORAL

## 2017-05-21 MED ORDER — AMLODIPINE BESYLATE 10 MG PO TABS: 10.0000 mg | ORAL_TABLET | Freq: Every day | ORAL | 1 refills | Status: DC

## 2017-05-21 MED ORDER — CLONIDINE HCL 0.1 MG PO TABS: 0.1000 mg | ORAL_TABLET | Freq: Three times a day (TID) | ORAL | 3 refills | Status: DC

## 2017-05-21 MED ORDER — TOPIRAMATE 25 MG PO TABS: 25.0000 mg | ORAL_TABLET | Freq: Every day | ORAL | 1 refills | Status: DC

## 2017-05-21 MED ORDER — LOSARTAN POTASSIUM 100 MG PO TABS: 100.0000 mg | ORAL_TABLET | Freq: Every day | ORAL | 3 refills | Status: DC

## 2017-05-21 MED ORDER — HYDROCHLOROTHIAZIDE 25 MG PO TABS: 25.0000 mg | ORAL_TABLET | Freq: Every day | ORAL | 1 refills | Status: DC

## 2017-05-21 NOTE — Progress Notes (Signed)
Subjective:  Patient ID: Richard Hale, male    DOB: [birth year]  Age: 31 y.o. MRN: [id number]  CC: HTN  HPI Richard Hale is a 31 y.o. male with a medical hx of HTn presents to f/u on BP. He was initially found to have a BP of 210/148 on 04/23/17. Was not taking anti-hypertensives at the time. Prescribed HCTZ 25 mg and Losartan 50 mg. BP is 213/138 today. Takes medications as directed.     Complains of frontal headache every day. Associated with phonophobia, photophobia, and nausea. Relieved with rest and NSAIDs. Denies CP, palpitations, SOB, HA, abdominal pain, f/c/n/v, or GI/GU sxs.    Outpatient Medications Prior to Visit  Medication Sig Dispense Refill  . aspirin EC 325 MG tablet Take 325 mg by mouth every 6 (six) hours as needed for moderate pain.    . hydrochlorothiazide (HYDRODIURIL) 25 MG tablet Take 1 tablet (25 mg total) by mouth daily. Take on tablet in the morning. 90 tablet 1  . losartan (COZAAR) 50 MG tablet Take 1 tablet (50 mg total) by mouth daily. 90 tablet 3  . losartan (COZAAR) 25 MG tablet Take 25 mg by mouth daily.     No facility-administered medications prior to visit.      ROS Review of Systems  Constitutional: Negative for chills, fever and malaise/fatigue.  Eyes: Negative for blurred vision.  Respiratory: Negative for shortness of breath.   Cardiovascular: Negative for chest pain and palpitations.  Gastrointestinal: Negative for abdominal pain and nausea.  Genitourinary: Negative for dysuria and hematuria.  Musculoskeletal: Negative for joint pain and myalgias.  Skin: Negative for rash.  Neurological: Positive for headaches. Negative for tingling.  Psychiatric/Behavioral: Negative for depression. The patient is not nervous/anxious.     Objective:  BP (!) 173/129 (BP Location: Right Arm, Patient Position: Sitting, Cuff Size: Large)   Pulse 88   Temp 98.6 F (37 C) (Oral)   Wt (!) 368 lb 9.6 oz (167.2 kg)   SpO2 97%   BMI 48.63 kg/m    BP/Weight 05/21/2017 04/23/2017 04/23/2017  Systolic BP 173 156 167  Diastolic BP 129 89 124  Wt. (Lbs) 368.6 - 369.8  BMI 48.63 - 48.79      Physical Exam  Constitutional: He is oriented to person, place, and time.  Well developed, obese, NAD, polite  HENT:  Head: Normocephalic and atraumatic.  Eyes: Conjunctivae are normal. No scleral icterus.  Neck: Normal range of motion. Neck supple. No thyromegaly present.  Cardiovascular: Normal rate, regular rhythm and normal heart sounds.   Pulmonary/Chest: Effort normal and breath sounds normal. No respiratory distress.  Musculoskeletal: He exhibits no edema.  Neurological: He is alert and oriented to person, place, and time. No cranial nerve deficit. Coordination normal.  Skin: Skin is warm and dry. No rash noted. No erythema. No pallor.  Psychiatric: He has a normal mood and affect. His behavior is normal. Thought content normal.  Vitals reviewed.    Assessment & Plan:   1. Accelerated hypertension - Administered cloNIDine (CATAPRES) tablet 0.2 mg; Take 2 tablets (0.2 mg total) by mouth once. - Increase losartan (COZAAR) 100 MG tablet; Take 1 tablet (100 mg total) by mouth daily.  Dispense: 90 tablet; Refill: 3 - Begin amLODipine (NORVASC) 10 MG tablet; Take 1 tablet (10 mg total) by mouth daily.  Dispense: 90 tablet; Refill: 1 - Begin cloNIDine (CATAPRES) 0.1 MG tablet; Take 1 tablet (0.1 mg total) by mouth 3 (three) times daily.  Dispense: 90  tablet; Refill: 3 - Begin hydrochlorothiazide (HYDRODIURIL) 25 MG tablet; Take 1 tablet (25 mg total) by mouth daily. Take on tablet in the morning.  Dispense: 90 tablet; Refill: 1  2. Other migraine without status migrainosus, not intractable - Begin topiramate (TOPAMAX) 25 MG tablet; Take 1 tablet (25 mg total) by mouth daily.  Dispense: 30 tablet; Refill: 1   Meds ordered this encounter  Medications  . cloNIDine (CATAPRES) tablet 0.2 mg  . losartan (COZAAR) 100 MG tablet    Sig: Take  1 tablet (100 mg total) by mouth daily.    Dispense:  90 tablet    Refill:  3    Order Specific Question:   Supervising Provider    Answer:   Quentin Angst [id number]  . amLODipine (NORVASC) 10 MG tablet    Sig: Take 1 tablet (10 mg total) by mouth daily.    Dispense:  90 tablet    Refill:  1    Order Specific Question:   Supervising Provider    Answer:   Quentin Angst [id number]  . cloNIDine (CATAPRES) 0.1 MG tablet    Sig: Take 1 tablet (0.1 mg total) by mouth 3 (three) times daily.    Dispense:  90 tablet    Refill:  3    Order Specific Question:   Supervising Provider    Answer:   Quentin Angst [id number]  . hydrochlorothiazide (HYDRODIURIL) 25 MG tablet    Sig: Take 1 tablet (25 mg total) by mouth daily. Take on tablet in the morning.    Dispense:  90 tablet    Refill:  1    Order Specific Question:   Supervising Provider    Answer:   Quentin Angst [id number]  . topiramate (TOPAMAX) 25 MG tablet    Sig: Take 1 tablet (25 mg total) by mouth daily.    Dispense:  30 tablet    Refill:  1    Order Specific Question:   Supervising Provider    Answer:   Quentin Angst [id number]    Follow-up: Return in about 2 weeks (around 06/04/2017).   Loletta Specter PA

## 2017-05-21 NOTE — Patient Instructions (Signed)
Migraine Headache A migraine headache is an intense, throbbing pain on one side or both sides of the head. Migraines may also cause other symptoms, such as nausea, vomiting, and sensitivity to light and noise. What are the causes? Doing or taking certain things may also trigger migraines, such as:  Alcohol.  Smoking.  Medicines, such as: ? Medicine used to treat chest pain (nitroglycerine). ? Birth control pills. ? Estrogen pills. ? Certain blood pressure medicines.  Aged cheeses, chocolate, or caffeine.  Foods or drinks that contain nitrates, glutamate, aspartame, or tyramine.  Physical activity.  Other things that may trigger a migraine include:  Menstruation.  Pregnancy.  Hunger.  Stress, lack of sleep, too much sleep, or fatigue.  Weather changes.  What increases the risk? The following factors may make you more likely to experience migraine headaches:  Age. Risk increases with age.  Family history of migraine headaches.  Being Caucasian.  Depression and anxiety.  Obesity.  Being a woman.  Having a hole in the heart (patent foramen ovale) or other heart problems.  What are the signs or symptoms? The main symptom of this condition is pulsating or throbbing pain. Pain may:  Happen in any area of the head, such as on one side or both sides.  Interfere with daily activities.  Get worse with physical activity.  Get worse with exposure to bright lights or loud noises.  Other symptoms may include:  Nausea.  Vomiting.  Dizziness.  General sensitivity to bright lights, loud noises, or smells.  Before you get a migraine, you may get warning signs that a migraine is developing (aura). An aura may include:  Seeing flashing lights or having blind spots.  Seeing bright spots, halos, or zigzag lines.  Having tunnel vision or blurred vision.  Having numbness or a tingling feeling.  Having trouble talking.  Having muscle weakness.  How is this  diagnosed? A migraine headache can be diagnosed based on:  Your symptoms.  A physical exam.  Tests, such as CT scan or MRI of the head. These imaging tests can help rule out other causes of headaches.  Taking fluid from the spine (lumbar puncture) and analyzing it (cerebrospinal fluid analysis, or CSF analysis).  How is this treated? A migraine headache is usually treated with medicines that:  Relieve pain.  Relieve nausea.  Prevent migraines from coming back.  Treatment may also include:  Acupuncture.  Lifestyle changes like avoiding foods that trigger migraines.  Follow these instructions at home: Medicines  Take over-the-counter and prescription medicines only as told by your health care provider.  Do not drive or use heavy machinery while taking prescription pain medicine.  To prevent or treat constipation while you are taking prescription pain medicine, your health care provider may recommend that you: ? Drink enough fluid to keep your urine clear or pale yellow. ? Take over-the-counter or prescription medicines. ? Eat foods that are high in fiber, such as fresh fruits and vegetables, whole grains, and beans. ? Limit foods that are high in fat and processed sugars, such as fried and sweet foods. Lifestyle  Avoid alcohol use.  Do not use any products that contain nicotine or tobacco, such as cigarettes and e-cigarettes. If you need help quitting, ask your health care provider.  Get at least 8 hours of sleep every night.  Limit your stress. General instructions   Keep a journal to find out what may trigger your migraine headaches. For example, write down: ? What you eat and   drink. ? How much sleep you get. ? Any change to your diet or medicines.  If you have a migraine: ? Avoid things that make your symptoms worse, such as bright lights. ? It may help to lie down in a dark, quiet room. ? Do not drive or use heavy machinery. ? Ask your health care provider  what activities are safe for you while you are experiencing symptoms.  Keep all follow-up visits as told by your health care provider. This is important. Contact a health care provider if:  You develop symptoms that are different or more severe than your usual migraine symptoms. Get help right away if:  Your migraine becomes severe.  You have a fever.  You have a stiff neck.  You have vision loss.  Your muscles feel weak or like you cannot control them.  You start to lose your balance often.  You develop trouble walking.  You faint. This information is not intended to replace advice given to you by your health care provider. Make sure you discuss any questions you have with your health care provider. Document Released: 09/28/2005 Document Revised: 04/17/2016 Document Reviewed: 03/16/2016 Elsevier Interactive Patient Education  2017 [employer]   

## 2017-05-21 NOTE — Progress Notes (Signed)
Patient is complaining of constant headaches on a daily basis.

## 2017-05-27 ENCOUNTER — Ambulatory Visit (HOSPITAL_COMMUNITY): Admission: RE | Admit: 2017-05-27 | Payer: BLUE CROSS/BLUE SHIELD | Source: Ambulatory Visit

## 2017-06-02 ENCOUNTER — Ambulatory Visit (HOSPITAL_COMMUNITY)
Admission: RE | Admit: 2017-06-02 | Discharge: 2017-06-02 | Disposition: A | Discharge status: Home / Self Care | Payer: BLUE CROSS/BLUE SHIELD | Source: Ambulatory Visit | Attending: Physician Assistant | Admitting: Physician Assistant

## 2017-06-02 DIAGNOSIS — I10 Essential (primary) hypertension: Secondary | ICD-10-CM

## 2017-06-02 NOTE — Progress Notes (Signed)
*  PRELIMINARY RESULTS* Vascular Ultrasound Renal Artery Duplex has been completed.  Preliminary findings: Limited exam due to patient body habitus, penetration, bowel and gas. Visualized portions of renal arteries appear within normal limits, no obvious signs of stenosis.  Limited visualization midline.  Chauncey Fischer 06/02/2017, 1:57 PM

## 2017-06-21 ENCOUNTER — Ambulatory Visit (INDEPENDENT_AMBULATORY_CARE_PROVIDER_SITE_OTHER): Payer: BLUE CROSS/BLUE SHIELD | Admitting: Physician Assistant

## 2017-06-21 ENCOUNTER — Encounter (INDEPENDENT_AMBULATORY_CARE_PROVIDER_SITE_OTHER): Payer: Self-pay | Admitting: Physician Assistant

## 2017-06-21 VITALS — BP 171/116 | HR 75 | Temp 98.4°F | Resp 20 | Ht 74.0 in | Wt 368.0 lb

## 2017-06-21 DIAGNOSIS — Z6841 Body Mass Index (BMI) 40.0 and over, adult: Secondary | ICD-10-CM

## 2017-06-21 DIAGNOSIS — L02224 Furuncle of groin: Secondary | ICD-10-CM

## 2017-06-21 DIAGNOSIS — R400 Somnolence: Secondary | ICD-10-CM | POA: Diagnosis not present

## 2017-06-21 DIAGNOSIS — E669 Obesity, unspecified: Secondary | ICD-10-CM | POA: Diagnosis not present

## 2017-06-21 DIAGNOSIS — I10 Essential (primary) hypertension: Secondary | ICD-10-CM | POA: Diagnosis not present

## 2017-06-21 DIAGNOSIS — IMO0001 Reserved for inherently not codable concepts without codable children: Secondary | ICD-10-CM

## 2017-06-21 DIAGNOSIS — M79671 Pain in right foot: Secondary | ICD-10-CM

## 2017-06-21 MED ORDER — CHLORHEXIDINE GLUCONATE 4 % EX LIQD: Freq: Every day | CUTANEOUS | 0 refills | Status: DC | PRN

## 2017-06-21 MED ORDER — NAPROXEN 500 MG PO TABS: 500.0000 mg | ORAL_TABLET | Freq: Two times a day (BID) | ORAL | 0 refills | Status: DC

## 2017-06-21 MED ORDER — CLINDAMYCIN PHOSPHATE 1 % EX GEL: Freq: Two times a day (BID) | CUTANEOUS | 0 refills | Status: DC

## 2017-06-21 MED ORDER — CLONIDINE HCL 0.1 MG PO TABS: 0.1000 mg | ORAL_TABLET | Freq: Two times a day (BID) | ORAL | 5 refills | Status: DC

## 2017-06-21 NOTE — Progress Notes (Addendum)
Subjective:  Patient ID: Richard Hale, male    DOB: 1986-08-15  Age: 31 y.o. MRN: [id number]  CC: foot pain  HPI Richard Hale is a 31 y.o. male with a medical history of HTN presents on f/u of HTN. BP 213/138 on 05/21/17 which was similar to previous BP of 210/148. Losartan was increased to 100 mg and he was started on amlodipine 10 mg qday and HCTZ 25 mg qam. He was prescribed clonidine 0.1 mg TID but not using yet because he wanted to f/u here first. Renal artery [place] was limited due to obesity but report states visualized portion of renal arteries was normal.    He also had been prescribed Topiramate 25 mg qday for prevention of headache. Says his headache is now every other day instead of every day. Headaches are usually felt in the morning. Has never been diagnosed with OSA. Patient snores, feels the need to take a nap during the day on occasion, trouble initiating sleep, irritability, morning headache, and throat discomfort in the morning that resolves after two hours.    Also complaint of right heel pain over the past six months. Pain is constant when walking. Worse with weight bearing. Better with rest and elevation of legs. Associated with mild swelling. Does not endorse trauma. No pain or swelling elsewhere in the extremities.   Outpatient Medications Prior to Visit  Medication Sig Dispense Refill  . amLODipine (NORVASC) 10 MG tablet Take 1 tablet (10 mg total) by mouth daily. 90 tablet 1  . aspirin EC 325 MG tablet Take 325 mg by mouth every 6 (six) hours as needed for moderate pain.    . cloNIDine (CATAPRES) 0.1 MG tablet Take 1 tablet (0.1 mg total) by mouth 3 (three) times daily. 90 tablet 3  . hydrochlorothiazide (HYDRODIURIL) 25 MG tablet Take 1 tablet (25 mg total) by mouth daily. Take on tablet in the morning. 90 tablet 1  . losartan (COZAAR) 100 MG tablet Take 1 tablet (100 mg total) by mouth daily. 90 tablet 3  . topiramate (TOPAMAX) 25 MG tablet Take 1 tablet (25 mg  total) by mouth daily. 30 tablet 1   No facility-administered medications prior to visit.      ROS Review of Systems  Constitutional: Negative for chills, fever and malaise/fatigue.  Eyes: Negative for blurred vision.  Respiratory: Negative for shortness of breath.   Cardiovascular: Negative for chest pain and palpitations.  Gastrointestinal: Negative for abdominal pain and nausea.  Genitourinary: Negative for dysuria and hematuria.  Musculoskeletal: Negative for joint pain and myalgias.       Right foot pain  Skin: Negative for rash.  Neurological: Positive for headaches. Negative for tingling.  Psychiatric/Behavioral: Negative for depression. The patient is not nervous/anxious.     Objective:  Ht  (1.88 m)   Wt (!) 368 lb (166.9 kg)   BMI 47.25 kg/m   BP/Weight 06/21/2017 05/21/2017 04/23/2017  Systolic BP - 173 156  Diastolic BP - 129 89  Wt. (Lbs) 368 368.6 -  BMI 47.25 48.63 -      Physical Exam  Constitutional: He is oriented to person, place, and time.  Well developed, obese, NAD, polite  HENT:  Head: Normocephalic and atraumatic.  Eyes: No scleral icterus.  Neck: Normal range of motion. Neck supple. No thyromegaly present.  Cardiovascular: Normal rate, regular rhythm and normal heart sounds.   Pulmonary/Chest: Effort normal and breath sounds normal.  Musculoskeletal: He exhibits no edema.  TTP along  the medial band of the plantar fascia of the right foot. Gait slightly antalgic favoring the right side.  Neurological: He is alert and oriented to person, place, and time. No cranial nerve deficit. Coordination normal.  Skin: Skin is warm and dry. No rash noted. No erythema. No pallor.  Psychiatric: He has a normal mood and affect. His behavior is normal. Thought content normal.  Vitals reviewed.    Assessment & Plan:    1. Hypertension, unspecified type - Begin cloNIDine (CATAPRES) 0.1 MG tablet; Take 1 tablet (0.1 mg total) by mouth 2 (two) times  daily.  Dispense: 60 tablet; Refill: 5  2. Class 3 obesity with serious comorbidity and body mass index (BMI) of 45.0 to 49.9 in adult, unspecified obesity type (HCC) - Likely multifactorial to include OSA and lifestyle habits. TSH 0.795 on 04/23/17.  3. Daytime somnolence - Nocturnal polysomnography (NPSG); Future  4. Right foot pain - Begin naproxen (NAPROSYN) 500 MG tablet; Take 1 tablet (500 mg total) by mouth 2 (two) times daily with a meal.  Dispense: 30 tablet; Refill: 0 - DG Foot Complete Right; Future  5. Furuncle of groin - Begin clindamycin (CLINDAGEL) 1 % gel; Apply topically 2 (two) times daily.  Dispense: 30 g; Refill: 0 - Begin chlorhexidine (HIBICLENS) 4 % external liquid; Apply topically daily as needed.  Dispense: 120 mL; Refill: 0   Meds ordered this encounter  Medications  . naproxen (NAPROSYN) 500 MG tablet    Sig: Take 1 tablet (500 mg total) by mouth 2 (two) times daily with a meal.    Dispense:  30 tablet    Refill:  0    Order Specific Question:   Supervising Provider    Answer:   Quentin Angst [id number]  . clindamycin (CLINDAGEL) 1 % gel    Sig: Apply topically 2 (two) times daily.    Dispense:  30 g    Refill:  0    Order Specific Question:   Supervising Provider    Answer:   Quentin Angst [id number]  . cloNIDine (CATAPRES) 0.1 MG tablet    Sig: Take 1 tablet (0.1 mg total) by mouth 2 (two) times daily.    Dispense:  60 tablet    Refill:  5    Order Specific Question:   Supervising Provider    Answer:   Quentin Angst [id number]  . chlorhexidine (HIBICLENS) 4 % external liquid    Sig: Apply topically daily as needed.    Dispense:  120 mL    Refill:  0    Order Specific Question:   Supervising Provider    Answer:   Quentin Angst [[id number]]    Follow-up: Return in about 6 weeks (around 08/02/2017) for insomnia and HTN.   Loletta Specter PA

## 2017-06-21 NOTE — Patient Instructions (Signed)

## 2017-06-24 ENCOUNTER — Ambulatory Visit (HOSPITAL_COMMUNITY)
Admission: RE | Admit: 2017-06-24 | Discharge: 2017-06-24 | Disposition: A | Discharge status: Home / Self Care | Payer: BLUE CROSS/BLUE SHIELD | Source: Ambulatory Visit | Attending: Physician Assistant | Admitting: Physician Assistant

## 2017-06-24 DIAGNOSIS — M7731 Calcaneal spur, right foot: Secondary | ICD-10-CM | POA: Diagnosis not present

## 2017-06-24 DIAGNOSIS — M79671 Pain in right foot: Secondary | ICD-10-CM

## 2017-07-01 ENCOUNTER — Ambulatory Visit (INDEPENDENT_AMBULATORY_CARE_PROVIDER_SITE_OTHER): Payer: BLUE CROSS/BLUE SHIELD | Admitting: Physician Assistant

## 2017-07-01 ENCOUNTER — Encounter (INDEPENDENT_AMBULATORY_CARE_PROVIDER_SITE_OTHER): Payer: Self-pay | Admitting: Physician Assistant

## 2017-07-01 VITALS — BP 174/124 | HR 73 | Temp 98.0°F | Resp 16 | Wt 366.4 lb

## 2017-07-01 DIAGNOSIS — M7731 Calcaneal spur, right foot: Secondary | ICD-10-CM | POA: Diagnosis not present

## 2017-07-01 DIAGNOSIS — R400 Somnolence: Secondary | ICD-10-CM

## 2017-07-01 DIAGNOSIS — I10 Essential (primary) hypertension: Secondary | ICD-10-CM

## 2017-07-01 DIAGNOSIS — M79671 Pain in right foot: Secondary | ICD-10-CM

## 2017-07-01 MED ORDER — CLONIDINE HCL 0.2 MG PO TABS: 0.2000 mg | ORAL_TABLET | Freq: Two times a day (BID) | ORAL | 5 refills | Status: DC

## 2017-07-01 MED ORDER — ACETAMINOPHEN-CODEINE #3 300-30 MG PO TABS: 1.0000 | ORAL_TABLET | ORAL | 0 refills | Status: AC | PRN

## 2017-07-01 MED ORDER — CLONIDINE HCL 0.1 MG PO TABS
0.2000 mg | ORAL_TABLET | Freq: Once | ORAL | Status: AC
  Administered 2017-07-01: 0.2 mg via ORAL

## 2017-07-01 MED ORDER — FOOT SLEEP SUPPORT MISC: 1.0000 | Freq: Every day | 0 refills | Status: DC

## 2017-07-01 MED ORDER — NAPROXEN 500 MG PO TABS: 11.0000 mg | ORAL_TABLET | Freq: Two times a day (BID) | ORAL | 0 refills | Status: DC

## 2017-07-01 NOTE — Patient Instructions (Signed)

## 2017-07-01 NOTE — Progress Notes (Signed)
Subjective:  Patient ID: Richard Hale, male    DOB: 08-09-1986  Age: 30 y.o. MRN: [id number]  CC: f/u foot pain and HTN  HPI Richard Hale is a 31 y.o. male with a medical history of HTN presents to f/u on right heel pain. Had XR which revealed small degenerative calcaneal spur. Continues with moderate to severe pain at the anterior aspect of right calcaneus. Naproxen was of little to no help.    BP is similar to previous visit despite addition of clonidine 0.1 mg BID. States he has not taken his BP meds this morning. Has seen that his BP is usually in the 150s/90.     Continues with daytime somnolence and symptoms of sleep apnea. Has not heard back from referral specialist or the Sleep Center.        Outpatient Medications Prior to Visit  Medication Sig Dispense Refill  . amLODipine (NORVASC) 10 MG tablet Take 1 tablet (10 mg total) by mouth daily. 90 tablet 1  . aspirin EC 325 MG tablet Take 325 mg by mouth every 6 (six) hours as needed for moderate pain.    . chlorhexidine (HIBICLENS) 4 % external liquid Apply topically daily as needed. 120 mL 0  . clindamycin (CLINDAGEL) 1 % gel Apply topically 2 (two) times daily. 30 g 0  . cloNIDine (CATAPRES) 0.1 MG tablet Take 1 tablet (0.1 mg total) by mouth 2 (two) times daily. 60 tablet 5  . hydrochlorothiazide (HYDRODIURIL) 25 MG tablet Take 1 tablet (25 mg total) by mouth daily. Take on tablet in the morning. 90 tablet 1  . losartan (COZAAR) 100 MG tablet Take 1 tablet (100 mg total) by mouth daily. 90 tablet 3  . naproxen (NAPROSYN) 500 MG tablet Take 1 tablet (500 mg total) by mouth 2 (two) times daily with a meal. 30 tablet 0  . topiramate (TOPAMAX) 25 MG tablet Take 1 tablet (25 mg total) by mouth daily. 30 tablet 1   No facility-administered medications prior to visit.      ROS Review of Systems  Constitutional: Negative for chills, fever and malaise/fatigue.  Eyes: Negative for blurred vision.  Respiratory: Negative  for shortness of breath.   Cardiovascular: Negative for chest pain and palpitations.  Gastrointestinal: Negative for abdominal pain and nausea.  Genitourinary: Negative for dysuria and hematuria.  Musculoskeletal: Negative for joint pain and myalgias.       Right foot pain  Skin: Negative for rash.  Neurological: Negative for tingling and headaches.  Psychiatric/Behavioral: Negative for depression. The patient is not nervous/anxious.     Objective:  Wt (!) 366 lb 6.4 oz (166.2 kg)   BMI 47.04 kg/m   BP/Weight 07/01/2017 06/21/2017 05/21/2017  Systolic BP - 171 173  Diastolic BP - 116 129  Wt. (Lbs) 366.4 368 368.6  BMI 47.04 47.25 48.63      Physical Exam  Constitutional: He is oriented to person, place, and time.  Well developed, obese, NAD, polite  HENT:  Head: Normocephalic and atraumatic.  Eyes: No scleral icterus.  Neck: Normal range of motion. Neck supple. No thyromegaly present.  Cardiovascular: Normal rate, regular rhythm and normal heart sounds.   Pulmonary/Chest: Effort normal and breath sounds normal.  Musculoskeletal: He exhibits no edema.  Neurological: He is alert and oriented to person, place, and time. No cranial nerve deficit. Coordination normal.  Limp favoring right side  Skin: Skin is warm and dry. No rash noted. No erythema. No pallor.  Psychiatric: He  has a normal mood and affect. His behavior is normal. Thought content normal.  Vitals reviewed.    Assessment & Plan:    1. Hypertension, unspecified type - Increase cloNIDine (CATAPRES) 0.2 MG tablet; Take 1 tablet (0.2 mg total) by mouth 2 (two) times daily.  Dispense: 60 tablet; Refill: 5 - Continue on Amlodipine, HCTZ, and Losartan - Pt's HTN may likely be multifactorial including pain, suspected sleep apnea, obesity, diet, and genetics.  2. Calcaneal spur of right foot - Suspected to also have plantar fasciitis  - naproxen (NAPROSYN) 500 MG tablet; Take 0.5 tablets (250 mg total) by mouth 2  (two) times daily with a meal.  Dispense: 30 tablet; Refill: 0 - Ambulatory referral to Podiatry  3. Right foot pain - Suspected Plantar Fasciitis. - Begin Foot Care Products (FOOT SLEEP SUPPORT) MISC; 1 each by Does not apply route at bedtime.  Dispense: 1 each; Refill: 0 - Use plantar fasciitis insoles - I personally showed patient how to stretch foot  - Advised on rolling foot over a cold soda bottle/can. - Begin acetaminophen-codeine (TYLENOL #3) 300-30 MG tablet; Take 1 tablet by mouth every 4 (four) hours as needed for moderate pain.  Dispense: 42 tablet; Refill: 0  4. Daytime somnolence - I had CMA call [facility] and schedule appointment for pt. Receptionist at sleep center said they will call patient and set up appointment.  - Ambulatory referral to Sleep Studies  Meds ordered this encounter  Medications  . cloNIDine (CATAPRES) 0.2 MG tablet    Sig: Take 1 tablet (0.2 mg total) by mouth 2 (two) times daily.    Dispense:  60 tablet    Refill:  5    Order Specific Question:   Supervising Provider    Answer:   Quentin Angst [id number]  . naproxen (NAPROSYN) 500 MG tablet    Sig: Take 0.5 tablets (250 mg total) by mouth 2 (two) times daily with a meal.    Dispense:  30 tablet    Refill:  0    Order Specific Question:   Supervising Provider    Answer:   Quentin Angst [id number]  . Foot Care Products (FOOT SLEEP SUPPORT) MISC    Sig: 1 each by Does not apply route at bedtime.    Dispense:  1 each    Refill:  0    Order Specific Question:   Supervising Provider    Answer:   Quentin Angst [id number]  . acetaminophen-codeine (TYLENOL #3) 300-30 MG tablet    Sig: Take 1 tablet by mouth every 4 (four) hours as needed for moderate pain.    Dispense:  42 tablet    Refill:  0    Order Specific Question:   Supervising Provider    Answer:   Quentin Angst [id number]    Follow-up: Return in about 4 weeks (around 07/29/2017) for HTN foot pain.    Loletta Specter PA

## 2017-07-01 NOTE — Addendum Note (Signed)
Addended by: Sindy Messing D on: 07/01/2017 01:06 PM   Modules accepted: Orders

## 2017-07-07 ENCOUNTER — Ambulatory Visit (INDEPENDENT_AMBULATORY_CARE_PROVIDER_SITE_OTHER): Payer: BLUE CROSS/BLUE SHIELD | Admitting: Physician Assistant

## 2017-07-29 ENCOUNTER — Ambulatory Visit (INDEPENDENT_AMBULATORY_CARE_PROVIDER_SITE_OTHER): Payer: BLUE CROSS/BLUE SHIELD | Admitting: Physician Assistant

## 2017-08-02 ENCOUNTER — Institutional Professional Consult (permissible substitution): Payer: Self-pay | Admitting: Neurology

## 2017-09-06 ENCOUNTER — Encounter: Payer: Self-pay | Admitting: Neurology

## 2017-09-06 ENCOUNTER — Ambulatory Visit (INDEPENDENT_AMBULATORY_CARE_PROVIDER_SITE_OTHER): Payer: BLUE CROSS/BLUE SHIELD | Admitting: Neurology

## 2017-09-06 VITALS — BP 195/129 | HR 83 | Ht 74.0 in | Wt 375.0 lb

## 2017-09-06 DIAGNOSIS — Z6841 Body Mass Index (BMI) 40.0 and over, adult: Secondary | ICD-10-CM

## 2017-09-06 DIAGNOSIS — I10 Essential (primary) hypertension: Secondary | ICD-10-CM

## 2017-09-06 DIAGNOSIS — G4726 Circadian rhythm sleep disorder, shift work type: Secondary | ICD-10-CM | POA: Diagnosis not present

## 2017-09-06 DIAGNOSIS — R519 Headache, unspecified: Secondary | ICD-10-CM

## 2017-09-06 DIAGNOSIS — R51 Headache: Secondary | ICD-10-CM

## 2017-09-06 DIAGNOSIS — R0683 Snoring: Secondary | ICD-10-CM | POA: Diagnosis not present

## 2017-09-06 NOTE — Progress Notes (Signed)
Subjective:    Patient ID: Richard Hale is a 31 y.o. male.  HPI     Richard FoleySaima Cayne Yom, MD, [name] [name] [address], [address] [address] [place], [place] [id number]  Dear Richard Hale,   I saw your patient, Richard Hale, upon your kind request in my neurologic clinic today for initial consultation of his sleep disorder, in particular, concern for underlying obstructive sleep apnea. The patient is unaccompanied today. As you know, Richard Hale is a 31 year old right-handed gentleman with an underlying medical history of hypertension, on multiple medications for this, smoking, right heel spur, and morbid obesity with BMI of over 40, who reports snoring and excessive daytime somnolence. I reviewed your office note from 07/01/2017. He also reports frequent morning headaches. His Epworth sleepiness score is 5 out of 24 today, fatigue score is 23 out of 63. He lives with his fiance and 5 of his 6 stepchildren. He has 3 biological children who come and stay every other weekend and during the summer. He works for a Administrator, [employer]. He smokes about 10 cigarettes per day, does not utilize alcohol, drinks caffeine infrequently.  He works nights, typically from 11 PM to 7:30 AM, 6 days a week. He has woken up with a sense of gasping for air, and fiance has noted apneic pauses while he is asleep. He has gained a little bit of weight. In September his weight was 366, currently at 375. He wakes up with a headache frequently. Bedtime is typically around noon and wake up time around 4 PM. He does not wake up rested. He tosses and turns a lot, he is a Agricultural consultantlight sleeper. Sometimes he has blurry vision. Of note, he has not had an eye exam in some time, years per his report. He has nocturia about once per day on average. He would prefer a daytime sleep study. He is not aware of any family history of OSA.  His Past Medical History Is Significant For: Past Medical History:  Diagnosis Date  . Hypertension      His Past Surgical History Is Significant For: Past Surgical History:  Procedure Laterality Date  . NOSE SURGERY      His Family History Is Significant For: No family history on file.  His Social History Is Significant For: Social History   Socioeconomic History  . Marital status: Single    Spouse name: None  . Number of children: None  . Years of education: None  . Highest education level: None  Social Needs  . Financial resource strain: None  . Food insecurity - worry: None  . Food insecurity - inability: None  . Transportation needs - medical: None  . Transportation needs - non-medical: None  Occupational History  . None  Tobacco Use  . Smoking status: Current Every Day Smoker    Packs/day: 0.50    Types: Cigarettes  . Smokeless tobacco: Never Used  Substance and Sexual Activity  . Alcohol use: Yes    Comment: occ  . Drug use: No  . Sexual activity: None  Other Topics Concern  . None  Social History Narrative  . None    His Allergies Are:  Allergies  Allergen Reactions  . Lisinopril Cough  :   His Current Medications Are:  Outpatient Encounter Medications as of 09/06/2017  Medication Sig  . amLODipine (NORVASC) 10 MG tablet Take 1 tablet (10 mg total) by mouth daily.  . clindamycin (CLINDAGEL) 1 % gel Apply topically 2 (two) times  daily.  . cloNIDine (CATAPRES) 0.2 MG tablet Take 1 tablet (0.2 mg total) by mouth 2 (two) times daily.  . Foot Care Products (FOOT SLEEP SUPPORT) MISC 1 each by Does not apply route at bedtime.  . hydrochlorothiazide (HYDRODIURIL) 25 MG tablet Take 1 tablet (25 mg total) by mouth daily. Take on tablet in the morning.  Marland Kitchen losartan (COZAAR) 100 MG tablet Take 1 tablet (100 mg total) by mouth daily.  . naproxen (NAPROSYN) 500 MG tablet Take 0.5 tablets (250 mg total) by mouth 2 (two) times daily with a meal.  . topiramate (TOPAMAX) 25 MG tablet Take 1 tablet (25 mg total) by mouth daily.  Marland Kitchen aspirin EC 325 MG tablet Take 325  mg by mouth every 6 (six) hours as needed for moderate pain.  . [DISCONTINUED] chlorhexidine (HIBICLENS) 4 % external liquid Apply topically daily as needed.   No facility-administered encounter medications on file as of 09/06/2017.   :   Review of Systems:  Out of a complete 14 point review of systems, all are reviewed and negative with the exception of these symptoms as listed below:  Review of Systems  Neurological:       Pt presents today to discuss his sleep. Pt reports that he only gets 3 hours of sleep. Pt works night shift. Pt does not know if he snores and has never had a sleep study.  Epworth Sleepiness Scale 0= would never doze 1= slight chance of dozing 2= moderate chance of dozing 3= high chance of dozing  Sitting and reading: 3 Watching TV: 1 Sitting inactive in a public place (ex. Theater or meeting): 0 As a passenger in a car for an hour without a break: 1 Lying down to rest in the afternoon: 0 Sitting and talking to someone: 0 Sitting quietly after lunch (no alcohol): 0 In a car, while stopped in traffic: 0 Total: 5    Objective:  Neurological Exam  Physical Exam Physical Examination:   Vitals:   09/06/17 0848  BP: (!) 195/129  Pulse: 83   General Examination: The patient is a very pleasant 31 y.o. male in no acute distress. He appears well-developed and well-nourished and adequately groomed. Upon recheck at the end of our visit his blood pressure was 176/126. He denied any chest pain or shortness of breath. He reported a mild headache.   HEENT: Normocephalic, atraumatic, pupils are equal, round and reactive to light and accommodation. Funduscopic exam is normal with sharp disc margins noted. Extraocular tracking is good without limitation to gaze excursion or nystagmus noted. Normal smooth pursuit is noted. Hearing is grossly intact. Tympanic membranes are clear bilaterally. Face is symmetric with normal facial animation and normal facial sensation.  Speech is clear with no dysarthria noted. There is no hypophonia. There is no lip, neck/head, jaw or voice tremor. Neck is supple with full range of passive and active motion. There are no carotid bruits on auscultation. Oropharynx exam reveals: moderate mouth dryness, adequate dental hygiene and moderate airway crowding, due to smaller airway entry, tonsils of about 1-2+, larger appearing uvula and larger tongue. Mallampati is class III. Tongue protrudes centrally and palate elevates symmetrically. Neck size is 19 3/8 inches.   Chest: Clear to auscultation without wheezing, rhonchi or crackles noted.  Heart: S1+S2+0, regular and normal without murmurs, rubs or gallops noted.   Abdomen: Soft, non-tender and non-distended with normal bowel sounds appreciated on auscultation.  Extremities: There is no pitting edema in the distal lower extremities bilaterally.  Pedal pulses are intact.  Skin: Warm and dry without trophic changes noted.  Musculoskeletal: exam reveals no obvious joint deformities, tenderness or joint swelling or erythema.   Neurologically:  Mental status: The patient is awake, alert and oriented in all 4 spheres. His immediate and remote memory, attention, language skills and fund of knowledge are appropriate. There is no evidence of aphasia, agnosia, apraxia or anomia. Speech is clear with normal prosody and enunciation. Thought process is linear. Mood is normal and affect is normal.  Cranial nerves II - XII are as described above under HEENT exam. In addition: shoulder shrug is normal with equal shoulder height noted. Motor exam: Normal bulk, strength and tone is noted. There is no drift, tremor or rebound. Romberg is negative. Reflexes are 1+ throughout. Fine motor skills and coordination: intact with normal finger taps, normal hand movements, normal rapid alternating patting, normal foot taps and normal foot agility.  Cerebellar testing: No dysmetria or intention tremor on finger to  nose testing. Heel to shin is unremarkable bilaterally. There is no truncal or gait ataxia.  Sensory exam: intact to light touch in the upper and lower extremities.  Gait, station and balance: He stands with mild difficulty. No veering to one side is noted. No leaning to one side is noted. Posture is age-appropriate and stance is narrow based. Gait shows normal stride length and normal pace. No problems turning are noted. Tandem walk is unremarkable.   Assessment and Plan:    In summary, Richard Hale is a very pleasant 31 y.o.-year old male with an underlying medical history of hypertension, on multiple medications for this, elevated blood pressure values today, smoking, right heel spur, and morbid obesity with BMI of over 40, whose history and physical exam are highly concerning for obstructive sleep apnea (OSA). Contributing factors to his poor sleep are chronic sleep deprivation, shift work, and poorly controlled hypertension, recent weight gain. I had a long chat with the patient about my findings and the diagnosis of OSA, its prognosis and treatment options. We talked about medical treatments, surgical interventions and non-pharmacological approaches. I explained in particular the risks and ramifications of untreated moderate to severe OSA, especially with respect to developing cardiovascular disease down the Road, including congestive heart failure, difficult to treat hypertension, cardiac arrhythmias, or stroke. Even type 2 diabetes has, in part, been linked to untreated OSA. Symptoms of untreated OSA include daytime sleepiness, memory problems, mood irritability and mood disorder such as depression and anxiety, lack of energy, as well as recurrent headaches, especially morning headaches. We talked about smoking cessation and trying to maintain a healthy lifestyle in general, as well as the importance of weight control. I encouraged the patient to eat healthy, exercise daily and keep well  hydrated, to keep a scheduled bedtime and wake time routine, to not skip any meals and eat healthy snacks in between meals. I advised the patient not to drive when feeling sleepy.  He is advised to get in touch with you regarding his elevated blood pressure values today. He's also advised to get an eye check done.  I recommended the following at this time: sleep study with potential positive airway pressure titration. (We will score hypopneas at 3%).   I explained the sleep test procedure to the patient and also outlined possible surgical and non-surgical treatment options of OSA, including the use of a custom-made dental device (which would require a referral to a specialist dentist or oral surgeon), upper airway surgical options, such  as pillar implants, radiofrequency surgery, tongue base surgery, and UPPP (which would involve a referral to an ENT surgeon). Rarely, jaw surgery such as mandibular advancement may be considered.  I also explained the CPAP treatment option to the patient, who indicated that he would be willing to try CPAP if the need arises. I explained the importance of being compliant with PAP treatment, not only for insurance purposes but primarily to improve his symptoms, and for the patient's long term health benefit, including to reduce His cardiovascular risks. I answered all his questions today and the patient was in agreement. I would like to see him back after the sleep study is completed and encouraged him to call with any interim questions, concerns, problems or updates.   Thank you very much for allowing me to participate in the care of this nice patient. If I can be of any further assistance to you please do not hesitate to call me at [phone].  Sincerely,   Richard Foley, MD, [name]

## 2017-09-06 NOTE — Patient Instructions (Addendum)

## 2017-10-14 ENCOUNTER — Ambulatory Visit (INDEPENDENT_AMBULATORY_CARE_PROVIDER_SITE_OTHER): Payer: BLUE CROSS/BLUE SHIELD | Admitting: Neurology

## 2017-10-14 DIAGNOSIS — R0683 Snoring: Secondary | ICD-10-CM

## 2017-10-14 DIAGNOSIS — G4733 Obstructive sleep apnea (adult) (pediatric): Secondary | ICD-10-CM | POA: Diagnosis not present

## 2017-10-14 DIAGNOSIS — I10 Essential (primary) hypertension: Secondary | ICD-10-CM

## 2017-10-14 DIAGNOSIS — R51 Headache: Secondary | ICD-10-CM

## 2017-10-14 DIAGNOSIS — Z6841 Body Mass Index (BMI) 40.0 and over, adult: Secondary | ICD-10-CM

## 2017-10-14 DIAGNOSIS — G4726 Circadian rhythm sleep disorder, shift work type: Secondary | ICD-10-CM

## 2017-10-14 DIAGNOSIS — R519 Headache, unspecified: Secondary | ICD-10-CM

## 2017-10-22 ENCOUNTER — Other Ambulatory Visit: Payer: Self-pay | Admitting: Neurology

## 2017-10-22 DIAGNOSIS — G4733 Obstructive sleep apnea (adult) (pediatric): Secondary | ICD-10-CM

## 2017-10-22 NOTE — Procedures (Signed)
PATIENT'S NAME:  Richard Hale, Richard Hale DOB:      1985-12-16      MR#:    [id number]     DATE OF RECORDING: 10/14/2017 REFERRING M.D.:  Loletta Specter PA-C Study Performed:   Baseline Polysomnogram HISTORY: 32 year old man with a history of hypertension, smoking, right heel spur, and morbid obesity, who reports snoring and excessive daytime somnolence. The patient endorsed the Epworth Sleepiness Scale at 5 points. The patient's weight 375 pounds with a height of 74 (inches), resulting in a BMI of 48.1 kg/m2. The patient's neck circumference measured 19.25 inches.  CURRENT MEDICATIONS: Norvasc; Clindagel; Catapress; HCTZ; Cozaar; Naprosyn; Topamax; Aspirin   PROCEDURE:  This is a multichannel digital polysomnogram utilizing the Somnostar 11.2 system.  Electrodes and sensors were applied and monitored per AASM Specifications.   EEG, EOG, Chin and Limb EMG, were sampled at 200 Hz.  ECG, Snore and Nasal Pressure, Thermal Airflow, Respiratory Effort, CPAP Flow and Pressure, Oximetry was sampled at 50 Hz. Digital video and audio were recorded.      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 Lights Out was at 08:47 and Lights On at 14:50.  Total recording time (TRT) was 363.5 minutes, with a total sleep time (TST) of  336 minutes.  The patient's sleep latency was 25 minutes. REM latency was 79 minutes. The sleep efficiency was 92.4%.     SLEEP ARCHITECTURE: WASO (Wake after sleep onset) was 12.5 minutes.  There were 10.5 minutes in Stage N1, 180.5 minutes Stage N2, 59.5 minutes Stage N3 and 85.5 minutes in Stage REM.  The percentage of Stage N1 was 3.1%, Stage N2 was 53.7%, which is normal, Stage N3 was 17.7%, which is normal, and Stage R (REM sleep) was 25.4%, which is normal. The arousals were noted as: 28 were spontaneous, 0 were associated with PLMs, 4 were associated with respiratory events.   Audio and video analysis did not show any abnormal or unusual  movements, behaviors, phonations or vocalizations. The patient took no bathroom breaks. Mild to moderate snoring was noted. The EKG was in keeping with normal sinus rhythm (NSR).  RESPIRATORY ANALYSIS:  There were a total of 73 respiratory events:  7 obstructive apneas, 0 central apneas and 0 mixed apneas with a total of 7 apneas and an apnea index (AI) of 1.25 /hour. There were 66 hypopneas with a hypopnea index of 11.8 /hour. The patient also had 0 respiratory event related arousals (RERAs).      The total APNEA/HYPOPNEA INDEX (AHI) was 13./hour and the total RESPIRATORY DISTURBANCE INDEX was 13. /hour.  69 events occurred in REM sleep and 4 events in NREM. The REM AHI was 48.4 /hour, versus a non-REM AHI of 1.. The patient spent 0 minutes of total sleep time in the supine position and 336 minutes in non-supine.. The supine AHI was n/a versus a non-supine AHI of 13.1.  OXYGEN SATURATION & C02:  The Wake baseline 02 saturation was 98%, with the lowest being 84%. Time spent below 89% saturation equaled 4 minutes.   PERIODIC LIMB MOVEMENTS: The patient had a total of 0 Periodic Limb Movements.  The Periodic Limb Movement (PLM) index was 0 and the PLM Arousal index was 0/hour.  Post-study, the patient indicated that sleep was better than usual.   IMPRESSION:  1. Obstructive Sleep Apnea (OSA)  RECOMMENDATIONS:  1. This study demonstrates overall mild obstructive sleep apnea, severe in REM sleep with a total  AHI of 13/hour, REM AHI of 48.4/hour, and O2 nadir of 84%. The absence of supine sleep likely underestimates his AHI and O2 nadir. Given the patient's medical history and sleep related complaints, treatment with positive airway pressure is recommended; this can be achieved in the form of autoPAP. Alternatively, a full-night CPAP titration study would allow optimization of therapy if needed. Other treatment options may include avoidance of supine sleep position along with weight loss, upper airway  or jaw surgery in selected patients or the use of an oral appliance in certain patients. ENT evaluation and/or consultation with a maxillofacial surgeon or dentist may be feasible in some instances.    2. Please note that untreated obstructive sleep apnea carries additional perioperative morbidity. Patients with significant obstructive sleep apnea should receive perioperative PAP therapy and the surgeons and particularly the anesthesiologist should be informed of the diagnosis and the severity of the sleep disordered breathing. 3. The patient should be cautioned not to drive, work at heights, or operate dangerous or heavy equipment when tired or sleepy. Review and reiteration of good sleep hygiene measures should be pursued with any patient. 4. The patient will be seen in follow-up by Dr. Frances Furbish at [facility] for discussion of the test results and further management strategies. The referring provider will be notified of the test results.  I certify that I have reviewed the entire raw data recording prior to the issuance of this report in accordance with the Standards of Accreditation of the American Academy of Sleep Medicine (AASM)   Huston Foley, MD, [name] Diplomat, American Board of Psychiatry and Neurology (Neurology and Sleep Medicine)

## 2017-10-22 NOTE — Progress Notes (Signed)
Patient referred by Sindy Messing, seen by me on 09/06/17, diagnostic PSG on 10/14/17.     Please call and notify the patient that the recent sleep study did confirm the diagnosis of obstructive sleep apnea. OSA is overall mild, but worth treating to see if he feels better after treatment. To that end I recommend treatment for this in the form of autoPAP, which means, that we don't have to bring him back for a second sleep study with CPAP, but will let him try an autoPAP machine at home, through a DME company (of his choice, or as per insurance requirement). The DME representative will educate him on how to use the machine, how to put the mask on, etc. I have placed an order in the chart. Please send referral, talk to patient, send report to PCP and referring MD. We will need a FU in sleep clinic for 10 weeks post-PAP set up, please arrange that as well. Thanks,   Huston Foley, MD, [name] [facility])

## 2017-10-25 ENCOUNTER — Telehealth: Payer: Self-pay

## 2017-10-25 NOTE — Telephone Encounter (Signed)
I called pt to discuss his sleep study results. No answer, left a message asking him to call me back. 

## 2017-10-25 NOTE — Telephone Encounter (Signed)
I called pt. I advised pt that Dr. Frances Furbish reviewed their sleep study results and found that pt has osa. Dr. Frances Furbish recommends that pt start an autopap at home. I reviewed PAP compliance expectations with the pt. Pt is agreeable to starting an auto-PAP. I advised pt that an order will be sent to a DME, [employer], and [employer] will call the pt within about one week after they file with the pt's insurance. [employer] will show the pt how to use the machine, fit for masks, and troubleshoot the auto-PAP if needed. A follow up appt was made for insurance purposes with Dr. Frances Furbish on 01/10/18 at 10:30am.see. Pt verbalized understanding to arrive 15 minutes early and bring their auto-PAP. A letter with all of this information in it will be mailed to the pt as a reminder. I verified with the pt that the address we have on file is correct. Pt verbalized understanding of results. Pt had no questions at this time but was encouraged to call back if questions arise.

## 2017-10-25 NOTE — Telephone Encounter (Signed)
-----   Message from Huston Foley, MD sent at 10/22/2017 11:17 AM EST ----- Patient referred by Sindy Messing, seen by me on 09/06/17, diagnostic PSG on 10/14/17.     Please call and notify the patient that the recent sleep study did confirm the diagnosis of obstructive sleep apnea. OSA is overall mild, but worth treating to see if he feels better after treatment. To that end I recommend treatment for this in the form of autoPAP, which means, that we don't have to bring him back for a second sleep study with CPAP, but will let him try an autoPAP machine at home, through a DME company (of his choice, or as per insurance requirement). The DME representative will educate him on how to use the machine, how to put the mask on, etc. I have placed an order in the chart. Please send referral, talk to patient, send report to PCP and referring MD. We will need a FU in sleep clinic for 10 weeks post-PAP set up, please arrange that as well. Thanks,   Huston Foley, MD, [name] [facility])

## 2017-10-25 NOTE — Telephone Encounter (Signed)
Pt returned RN's call °

## 2017-12-10 DIAGNOSIS — G4733 Obstructive sleep apnea (adult) (pediatric): Secondary | ICD-10-CM | POA: Diagnosis not present

## 2018-01-10 ENCOUNTER — Telehealth: Payer: Self-pay

## 2018-01-10 ENCOUNTER — Ambulatory Visit: Payer: Self-pay | Admitting: Neurology

## 2018-01-10 DIAGNOSIS — G4733 Obstructive sleep apnea (adult) (pediatric): Secondary | ICD-10-CM | POA: Diagnosis not present

## 2018-01-10 NOTE — Telephone Encounter (Signed)
Pt did not show for their appt with Dr. Athar today.  

## 2018-01-11 ENCOUNTER — Encounter: Payer: Self-pay | Admitting: Neurology

## 2018-02-09 DIAGNOSIS — G4733 Obstructive sleep apnea (adult) (pediatric): Secondary | ICD-10-CM | POA: Diagnosis not present

## 2018-03-12 DIAGNOSIS — G4733 Obstructive sleep apnea (adult) (pediatric): Secondary | ICD-10-CM | POA: Diagnosis not present

## 2018-04-11 DIAGNOSIS — G4733 Obstructive sleep apnea (adult) (pediatric): Secondary | ICD-10-CM | POA: Diagnosis not present

## 2018-05-12 DIAGNOSIS — G4733 Obstructive sleep apnea (adult) (pediatric): Secondary | ICD-10-CM | POA: Diagnosis not present

## 2018-06-12 DIAGNOSIS — G4733 Obstructive sleep apnea (adult) (pediatric): Secondary | ICD-10-CM | POA: Diagnosis not present

## 2018-07-12 DIAGNOSIS — G4733 Obstructive sleep apnea (adult) (pediatric): Secondary | ICD-10-CM | POA: Diagnosis not present

## 2018-08-08 ENCOUNTER — Ambulatory Visit: Payer: BLUE CROSS/BLUE SHIELD | Attending: Family Medicine | Admitting: Family Medicine

## 2018-08-08 ENCOUNTER — Other Ambulatory Visit: Payer: Self-pay

## 2018-08-08 ENCOUNTER — Encounter: Payer: Self-pay | Admitting: Family Medicine

## 2018-08-08 VITALS — BP 196/146 | HR 91 | Temp 97.9°F | Resp 18 | Ht 74.0 in | Wt 377.6 lb

## 2018-08-08 DIAGNOSIS — Z91148 Patient's other noncompliance with medication regimen for other reason: Secondary | ICD-10-CM

## 2018-08-08 DIAGNOSIS — G4733 Obstructive sleep apnea (adult) (pediatric): Secondary | ICD-10-CM

## 2018-08-08 DIAGNOSIS — F17210 Nicotine dependence, cigarettes, uncomplicated: Secondary | ICD-10-CM | POA: Diagnosis not present

## 2018-08-08 DIAGNOSIS — I10 Essential (primary) hypertension: Secondary | ICD-10-CM | POA: Diagnosis not present

## 2018-08-08 DIAGNOSIS — R51 Headache: Secondary | ICD-10-CM | POA: Diagnosis not present

## 2018-08-08 DIAGNOSIS — Z833 Family history of diabetes mellitus: Secondary | ICD-10-CM | POA: Insufficient documentation

## 2018-08-08 DIAGNOSIS — Z6841 Body Mass Index (BMI) 40.0 and over, adult: Secondary | ICD-10-CM

## 2018-08-08 DIAGNOSIS — Z9114 Patient's other noncompliance with medication regimen: Secondary | ICD-10-CM

## 2018-08-08 DIAGNOSIS — Z8249 Family history of ischemic heart disease and other diseases of the circulatory system: Secondary | ICD-10-CM | POA: Diagnosis not present

## 2018-08-08 DIAGNOSIS — R519 Headache, unspecified: Secondary | ICD-10-CM

## 2018-08-08 DIAGNOSIS — G8929 Other chronic pain: Secondary | ICD-10-CM

## 2018-08-08 MED ORDER — AMLODIPINE BESYLATE 10 MG PO TABS: 10.0000 mg | ORAL_TABLET | Freq: Every day | ORAL | 1 refills | Status: DC

## 2018-08-08 MED ORDER — HYDROCHLOROTHIAZIDE 25 MG PO TABS: 25.0000 mg | ORAL_TABLET | Freq: Every day | ORAL | 1 refills | Status: DC

## 2018-08-08 MED ORDER — IRBESARTAN 150 MG PO TABS: 150.0000 mg | ORAL_TABLET | Freq: Every day | ORAL | 3 refills | Status: DC

## 2018-08-08 NOTE — Progress Notes (Signed)
Subjective:    Patient ID: Richard Hale, male    DOB: 1986/04/23, 32 y.o.   MRN: [id number]  HPI       32 year old male new to me as a patient but who has been followed at [facility] medical care who presents secondary concern regarding chronic, daily headaches.  Patient states he has a bandlike pressure around his head which can occur at different times per day.  Headache is also sometimes sharp and throbbing.  Patient also has a history of hypertension.  Patient states that he does sometimes forget to take his blood pressure medications.  Patient states that he did have a test for obstructive sleep apnea and patient was told that he does have obstructive sleep apnea.  Patient however states that when he went to be fitted for a mask, patient was talked into getting a type of facemask that he did not want and patient states that he cannot use the current facemask and therefore has not been using his CPAP machine.  Patient continues to have daytime fatigue as well as his daily headaches.  Patient denies any chest pain or palpitations.  No shortness of breath or cough.  Patient denies any abdominal pain.  Patient denies any peripheral edema/swelling in his legs.      Patient does not have any light sensitivity or noise sensitivity during his headaches.  Patient denies any visual changes with headaches.  Patient does not feel as if he has any visual changes or warning of onset of a headache.  Patient does sometimes wake up with a headache or headache will come on gradually during the day.  Headache is improved with the use of over-the-counter medications.  Headaches can last for hours.  Patient does not believe that there is any family history of migraine headaches.  Patient does have family history of mother having hypertension and maternal grandmother with diabetes.  Patient reports that he has had past nasal surgery but sometimes still has difficulty breathing through his nose. Past Medical History:    Diagnosis Date  . Hypertension    Past Surgical History:  Procedure Laterality Date  . NOSE SURGERY     Social History   Tobacco Use  . Smoking status: Current Every Day Smoker    Packs/day: 0.50    Types: Cigarettes  . Smokeless tobacco: Never Used  Substance Use Topics  . Alcohol use: Yes    Comment: occ  . Drug use: No   Allergies  Allergen Reactions  . Lisinopril Cough           Review of Systems  Constitutional: Positive for fatigue. Negative for chills and fever.  HENT: Positive for congestion. Negative for sore throat and trouble swallowing.   Eyes: Negative for photophobia and visual disturbance.  Respiratory: Negative for cough and shortness of breath.   Cardiovascular: Negative for chest pain and palpitations.  Gastrointestinal: Negative for abdominal pain, constipation, diarrhea and nausea.  Genitourinary: Negative for dysuria and frequency.  Musculoskeletal: Negative for arthralgias, back pain, gait problem and joint swelling.  Neurological: Positive for headaches. Negative for dizziness, light-headedness and numbness.       Objective:   Physical Exam BP (!) 196/146 (BP Location: Right Arm, Cuff Size: Large)   Pulse 91   Temp 97.9 F (36.6 C) (Oral)   Resp 18   Ht 6\' 2"  (1.88 m)   Wt (!) 377 lb 9.6 oz (171.3 kg)   SpO2 96%   BMI 48.48 kg/m Nurse's notes  and vital signs reviewed General- well-nourished, well-developed obese male in no acute distress ENT- slightly dull TMs, nares with edema of the nasal turbinates, patient with mild posterior pharynx erythema and a narrowed posterior airway/large tongue base Neck-large neck size, no lymphadenopathy, no thyromegaly, no carotid bruit Lungs-clear to auscultation bilaterally Cardiovascular-regular rate and rhythm Abdomen-truncal obesity, soft and nontender Back-no CVA tenderness Extremities-no edema Neuro- cranial nerves II through XII are grossly intact, no nystagmus on my exam         Assessment & Plan:  1. Uncontrolled hypertension I discussed with the patient that there likely several factors for his headache including his uncontrolled hypertension as well as his obstructive sleep apnea.  Patient was asked to take amlodipine 10 mg and Avapro 150 mg added as patient with prior cough with use of lisinopril..  Patient will also continue the use of hydrochlorothiazide.  Due to patient's hypertension, obesity and long-term use of medications, patient will have CMP, lipid panel and hemoglobin A1c at today's visit. - irbesartan (AVAPRO) 150 MG tablet; Take 1 tablet (150 mg total) by mouth daily. To lower blood pressure  Dispense: 30 tablet; Refill: 3 - amLODipine (NORVASC) 10 MG tablet; Take 1 tablet (10 mg total) by mouth daily.  Dispense: 90 tablet; Refill: 1 - Comprehensive metabolic panel - Hemoglobin A1c - Lipid panel - hydrochlorothiazide (HYDRODIURIL) 25 MG tablet; Take 1 tablet (25 mg total) by mouth daily. Take on tablet in the morning.  Dispense: 90 tablet; Refill: 1  2. OSA (obstructive sleep apnea) Discussed with patient the need for treatment of his obstructive sleep apnea as this can cause continued hypertension, cardiac arrhythmias or even death.  Patient is encouraged to call his CPAP supplier regarding his mask and I will also ask our nurse representative from this office to also contact the patient regarding assistance with his CPAP.  Weight loss is also encouraged.  3. Chronic nonintractable headache, unspecified headache type Discussed with patient that his headaches are likely due to a combination of his untreated hypertension as well as his obstructive sleep apnea.  Patient agrees to make efforts to become compliant with daily use of his medications.  Patient is to contact his CPAP supplier in order to obtain a suitable mask.  Patient was made aware of the importance of using his CPAP for treatment of obstructive sleep apnea.  Patient currently states that his  headaches do resolve with the use of over-the-counter medications which patient can continue.  Patient may wish to however limit NSAID use as this can also raise his blood pressure and patient also advised that daily use of pain medication can lead to rebound headaches. - Comprehensive metabolic panel  4. Morbid obesity with BMI of 45.0-49.9, adult [facility]) Patient with morbid obesity and patient is encouraged to follow efforts at weight loss including a low fat diet as well as decreased processed foods and increase in fresh fruits and vegetables along with regular exercise.  Patient will have CMP, hemoglobin A1c and lipid panel in follow-up of obesity as his obesity increases his risk of diabetes, hyperlipidemia and heart disease. - Comprehensive metabolic panel - Hemoglobin A1c - Lipid panel  5. Noncompliance with medication treatment due to intermittent use of medication On review of patient's medical chart, patient has a history of noncompliance with medications due to intermittent use.  Patient states that he sometimes forgets to take his medications.  Patient encouraged to use a pill planner or phone alarm as a reminder that he needs to  take his medications on a daily basis.  *Influenza immunization was offered but declined at today's visit  An After Visit Summary was printed and given to the patient.  Return for 2 weeks with Franky Macho; 4 weeks with PCP.

## 2018-08-08 NOTE — Progress Notes (Signed)
F;u shot: no Pain: chest pain: since yesterday, 3 Headaches, daily

## 2018-08-08 NOTE — Patient Instructions (Signed)
DASH Eating Plan DASH stands for "Dietary Approaches to Stop Hypertension." The DASH eating plan is a healthy eating plan that has been shown to reduce high blood pressure (hypertension). It may also reduce your risk for type 2 diabetes, heart disease, and stroke. The DASH eating plan may also help with weight loss. What are tips for following this plan? General guidelines  Avoid eating more than 2,300 mg (milligrams) of salt (sodium) a day. If you have hypertension, you may need to reduce your sodium intake to 1,500 mg a day.  Limit alcohol intake to no more than 1 drink a day for nonpregnant women and 2 drinks a day for men. One drink equals 12 oz of beer, 5 oz of wine, or 1 oz of hard liquor.  Work with your health care provider to maintain a healthy body weight or to lose weight. Ask what an ideal weight is for you.  Get at least 30 minutes of exercise that causes your heart to beat faster (aerobic exercise) most days of the week. Activities may include walking, swimming, or biking.  Work with your health care provider or diet and nutrition specialist (dietitian) to adjust your eating plan to your individual calorie needs. Reading food labels  Check food labels for the amount of sodium per serving. Choose foods with less than 5 percent of the Daily Value of sodium. Generally, foods with less than 300 mg of sodium per serving fit into this eating plan.  To find whole grains, look for the word "whole" as the first word in the ingredient list. Shopping  Buy products labeled as "low-sodium" or "no salt added."  Buy fresh foods. Avoid canned foods and premade or frozen meals. Cooking  Avoid adding salt when cooking. Use salt-free seasonings or herbs instead of table salt or sea salt. Check with your health care provider or pharmacist before using salt substitutes.  Do not fry foods. Cook foods using healthy methods such as baking, boiling, grilling, and broiling instead.  Cook with  heart-healthy oils, such as olive, canola, soybean, or sunflower oil. Meal planning   Eat a balanced diet that includes: ? 5 or more servings of fruits and vegetables each day. At each meal, try to fill half of your plate with fruits and vegetables. ? Up to 6-8 servings of whole grains each day. ? Less than 6 oz of lean meat, poultry, or fish each day. A 3-oz serving of meat is about the same size as a deck of cards. One egg equals 1 oz. ? 2 servings of low-fat dairy each day. ? A serving of nuts, seeds, or beans 5 times each week. ? Heart-healthy fats. Healthy fats called Omega-3 fatty acids are found in foods such as flaxseeds and coldwater fish, like sardines, salmon, and mackerel.  Limit how much you eat of the following: ? Canned or prepackaged foods. ? Food that is high in trans fat, such as fried foods. ? Food that is high in saturated fat, such as fatty meat. ? Sweets, desserts, sugary drinks, and other foods with added sugar. ? Full-fat dairy products.  Do not salt foods before eating.  Try to eat at least 2 vegetarian meals each week.  Eat more home-cooked food and less restaurant, buffet, and fast food.  When eating at a restaurant, ask that your food be prepared with less salt or no salt, if possible. What foods are recommended? The items listed may not be a complete list. Talk with your dietitian about what   dietary choices are best for you. Grains Whole-grain or whole-wheat bread. Whole-grain or whole-wheat pasta. Brown rice. Oatmeal. Quinoa. Bulgur. Whole-grain and low-sodium cereals. Pita bread. Low-fat, low-sodium crackers. Whole-wheat flour tortillas. Vegetables Fresh or frozen vegetables (raw, steamed, roasted, or grilled). Low-sodium or reduced-sodium tomato and vegetable juice. Low-sodium or reduced-sodium tomato sauce and tomato paste. Low-sodium or reduced-sodium canned vegetables. Fruits All fresh, dried, or frozen fruit. Canned fruit in natural juice (without  added sugar). Meat and other protein foods Skinless chicken or turkey. Ground chicken or turkey. Pork with fat trimmed off. Fish and seafood. Egg whites. Dried beans, peas, or lentils. Unsalted nuts, nut butters, and seeds. Unsalted canned beans. Lean cuts of beef with fat trimmed off. Low-sodium, lean deli meat. Dairy Low-fat (1%) or fat-free (skim) milk. Fat-free, low-fat, or reduced-fat cheeses. Nonfat, low-sodium ricotta or cottage cheese. Low-fat or nonfat yogurt. Low-fat, low-sodium cheese. Fats and oils Soft margarine without trans fats. Vegetable oil. Low-fat, reduced-fat, or light mayonnaise and salad dressings (reduced-sodium). Canola, safflower, olive, soybean, and sunflower oils. Avocado. Seasoning and other foods Herbs. Spices. Seasoning mixes without salt. Unsalted popcorn and pretzels. Fat-free sweets. What foods are not recommended? The items listed may not be a complete list. Talk with your dietitian about what dietary choices are best for you. Grains Baked goods made with fat, such as croissants, muffins, or some breads. Dry pasta or rice meal packs. Vegetables Creamed or fried vegetables. Vegetables in a cheese sauce. Regular canned vegetables (not low-sodium or reduced-sodium). Regular canned tomato sauce and paste (not low-sodium or reduced-sodium). Regular tomato and vegetable juice (not low-sodium or reduced-sodium). Pickles. Olives. Fruits Canned fruit in a light or heavy syrup. Fried fruit. Fruit in cream or butter sauce. Meat and other protein foods Fatty cuts of meat. Ribs. Fried meat. Bacon. Sausage. Bologna and other processed lunch meats. Salami. Fatback. Hotdogs. Bratwurst. Salted nuts and seeds. Canned beans with added salt. Canned or smoked fish. Whole eggs or egg yolks. Chicken or turkey with skin. Dairy Whole or 2% milk, cream, and half-and-half. Whole or full-fat cream cheese. Whole-fat or sweetened yogurt. Full-fat cheese. Nondairy creamers. Whipped toppings.  Processed cheese and cheese spreads. Fats and oils Butter. Stick margarine. Lard. Shortening. Ghee. Bacon fat. Tropical oils, such as coconut, palm kernel, or palm oil. Seasoning and other foods Salted popcorn and pretzels. Onion salt, garlic salt, seasoned salt, table salt, and sea salt. Worcestershire sauce. Tartar sauce. Barbecue sauce. Teriyaki sauce. Soy sauce, including reduced-sodium. Steak sauce. Canned and packaged gravies. Fish sauce. Oyster sauce. Cocktail sauce. Horseradish that you find on the shelf. Ketchup. Mustard. Meat flavorings and tenderizers. Bouillon cubes. Hot sauce and Tabasco sauce. Premade or packaged marinades. Premade or packaged taco seasonings. Relishes. Regular salad dressings. Where to find more information:  National Heart, Lung, and Blood Institute: www.nhlbi.nih.gov  American Heart Association: www.heart.org Summary  The DASH eating plan is a healthy eating plan that has been shown to reduce high blood pressure (hypertension). It may also reduce your risk for type 2 diabetes, heart disease, and stroke.  With the DASH eating plan, you should limit salt (sodium) intake to 2,300 mg a day. If you have hypertension, you may need to reduce your sodium intake to 1,500 mg a day.  When on the DASH eating plan, aim to eat more fresh fruits and vegetables, whole grains, lean proteins, low-fat dairy, and heart-healthy fats.  Work with your health care provider or diet and nutrition specialist (dietitian) to adjust your eating plan to your individual   calorie needs. This information is not intended to replace advice given to you by your health care provider. Make sure you discuss any questions you have with your health care provider. Document Released: 09/17/2011 Document Revised: 09/21/2016 Document Reviewed: 09/21/2016 Elsevier Interactive Patient Education  2018 [employer]  

## 2018-08-09 ENCOUNTER — Telehealth: Payer: Self-pay

## 2018-08-09 LAB — COMPREHENSIVE METABOLIC PANEL WITH GFR
ALT: 17 IU/L (ref 0–44)
AST: 22 IU/L (ref 0–40)
Albumin/Globulin Ratio: 1.4 (ref 1.2–2.2)
Albumin: 4.3 g/dL (ref 3.5–5.5)
Alkaline Phosphatase: 94 IU/L (ref 39–117)
BUN/Creatinine Ratio: 14 (ref 9–20)
BUN: 13 mg/dL (ref 6–20)
Bilirubin Total: 0.4 mg/dL (ref 0.0–1.2)
CO2: 25 mmol/L (ref 20–29)
Calcium: 9.3 mg/dL (ref 8.7–10.2)
Chloride: 103 mmol/L (ref 96–106)
Creatinine, Ser: 0.93 mg/dL (ref 0.76–1.27)
GFR calc Af Amer: 126 mL/min/1.73
GFR calc non Af Amer: 109 mL/min/1.73
Globulin, Total: 3 g/dL (ref 1.5–4.5)
Glucose: 91 mg/dL (ref 65–99)
Potassium: 3.5 mmol/L (ref 3.5–5.2)
Sodium: 141 mmol/L (ref 134–144)
Total Protein: 7.3 g/dL (ref 6.0–8.5)

## 2018-08-09 LAB — LIPID PANEL
Chol/HDL Ratio: 2.8 ratio (ref 0.0–5.0)
Cholesterol, Total: 136 mg/dL (ref 100–199)
HDL: 49 mg/dL
LDL Calculated: 65 mg/dL (ref 0–99)
Triglycerides: 112 mg/dL (ref 0–149)
VLDL Cholesterol Cal: 22 mg/dL (ref 5–40)

## 2018-08-09 LAB — HEMOGLOBIN A1C
Est. average glucose Bld gHb Est-mCnc: 120 mg/dL
Hgb A1c MFr Bld: 5.8 % — ABNORMAL HIGH (ref 4.8–5.6)

## 2018-08-09 NOTE — Telephone Encounter (Signed)
Call placed to the patient at the request of Dr Jillyn Hidden.  He said that he is not pleased with his full face CPAP mask.  He said that he has not yet contacted [employer] who fit him for the mask.  Instructed him to contact [facility] and then contact this office after that if he has any further questions.

## 2018-08-12 DIAGNOSIS — G4733 Obstructive sleep apnea (adult) (pediatric): Secondary | ICD-10-CM | POA: Diagnosis not present

## 2018-08-15 ENCOUNTER — Telehealth: Payer: Self-pay

## 2018-08-15 NOTE — Telephone Encounter (Signed)
Patient was called, verified dob and was given most recent lab results. Patient verbalized understanding and had no further questions.

## 2018-08-15 NOTE — Telephone Encounter (Signed)
-----   Message from Cain Saupe, MD sent at 08/09/2018  7:15 PM EDT ----- Please notify patient that his CMET (electrolytes and liver function test) was normal as well as his lipid panel. Patient however has mild increase in his Hgb A1c at 5.8 consistent with pre-diabetes which indicates an increased risk of developing diabetes. Patient should make efforts at following a low carbohydrate diet as well as regular exercise and weight loss to help lower his future diabetes risk

## 2018-08-22 ENCOUNTER — Ambulatory Visit: Payer: BLUE CROSS/BLUE SHIELD | Attending: Family Medicine | Admitting: Pharmacist

## 2018-08-22 ENCOUNTER — Encounter: Payer: Self-pay | Admitting: Pharmacist

## 2018-08-22 VITALS — BP 167/95 | HR 69

## 2018-08-22 DIAGNOSIS — Z79899 Other long term (current) drug therapy: Secondary | ICD-10-CM | POA: Diagnosis not present

## 2018-08-22 DIAGNOSIS — I10 Essential (primary) hypertension: Secondary | ICD-10-CM | POA: Insufficient documentation

## 2018-08-22 DIAGNOSIS — F17210 Nicotine dependence, cigarettes, uncomplicated: Secondary | ICD-10-CM | POA: Insufficient documentation

## 2018-08-22 MED ORDER — IRBESARTAN 300 MG PO TABS: 300.0000 mg | ORAL_TABLET | Freq: Every day | ORAL | 0 refills | Status: DC

## 2018-08-22 NOTE — Patient Instructions (Signed)
Thank you for coming to see [place] today.   Blood pressure today is improving.   Start taking blood pressure medications as follows:   1. Irbesartan 300 mg: 1 tablet daily when you start your day.  2. HCTZ 25 mg: 1 tablet daily when you start your day.  3. Amlodipine 10 mg: 1 tablet daily before bedtime.   Limiting salt and caffeine, as well as exercising as able for at least 30 minutes for 5 days out of the week, can also help you lower your blood pressure.  Take your blood pressure at home if you are able. Please write down these numbers and bring them to your visits.  If you have any questions about medications, please call me [phone].  Franky Macho

## 2018-08-22 NOTE — Progress Notes (Signed)
   S:    PCP: Dr. Jillyn Hidden  Patient arrives in good spirits. Presents to the clinic for hypertension management. Patient was referred by Dr. Jillyn Hidden on 08/08/18.  Patient was last seen by Primary Care Provider on 08/08/18. BP 208/149 at that visit. Clonidine 0.2 mg x1 given. BP dropped to 196/146.   Today, pt denies CP, SOB, HA, or blurred vision. Denies BLE edema. Reports lack of sleep. "Sometimes I lay in bed 8-12 hours without falling asleep".    Patient reports adherence with medications. Of note, he reports continuing to take losartan as well as irbesartan.   Current BP Medications include:   - Amlodipine 10 mg daily  - HCTZ 25 mg daily - Irbesartan 150 mg daily  Antihypertensives tried in the past include:  - lisinopril (d/c d/t cough) - losartan 25, 50, and 100 mg daily - clonidine   Dietary habits include:  - Pt limits salt - 1-2 cups of coffee/day Exercise habits include: - Does not exercise outside of work Family / Social history: - FHx: HTN (mother) - Tobacco: 1/2 PPD current smoker - Alcohol: occasional   Home BP readings:  - Does not take at home  O:  L arm after 5 minutes rest: 167/95, HR 69   Last 3 Office BP readings: BP Readings from Last 3 Encounters:  08/08/18 (!) 196/146  09/06/17 (!) 195/129  07/01/17 (!) 174/124   BMET    Component Value Date/Time   NA 141 08/08/2018 1651   K 3.5 08/08/2018 1651   CL 103 08/08/2018 1651   CO2 25 08/08/2018 1651   GLUCOSE 91 08/08/2018 1651   GLUCOSE 117 (H) 04/23/2017 1538   BUN 13 08/08/2018 1651   CREATININE 0.93 08/08/2018 1651   CALCIUM 9.3 08/08/2018 1651   GFRNONAA 109 08/08/2018 1651   GFRAA 126 08/08/2018 1651   Renal function: Estimated Creatinine Clearance: 191.8 mL/min (by C-G formula based on SCr of 0.93 mg/dL).  A/P: Hypertension longstanding currently uncontrolled on current medications. BP Goal <130/80 mmHg. Patient is adherent with current medications.   -Increased dose of irbesartan to 300  mg daily. -Continued amlodipine and HCTZ daily.  -Patient instructed to stop losartan  -Counseled on lifestyle modifications for blood pressure control including reduced dietary sodium, increased exercise, adequate sleep  Results reviewed and written information provided. Total time in face-to-face counseling 15 minutes.   F/U Clinic Visit in 09/05/18.    Butch Penny, PharmD, CPP Clinical Pharmacist [facility] [phone]

## 2018-09-05 ENCOUNTER — Encounter: Payer: Self-pay | Admitting: Family Medicine

## 2018-09-05 ENCOUNTER — Telehealth: Payer: Self-pay

## 2018-09-05 ENCOUNTER — Ambulatory Visit: Payer: BLUE CROSS/BLUE SHIELD | Attending: Family Medicine | Admitting: Family Medicine

## 2018-09-05 VITALS — BP 164/110 | HR 97 | Temp 98.0°F | Ht 74.0 in | Wt 379.2 lb

## 2018-09-05 DIAGNOSIS — Z888 Allergy status to other drugs, medicaments and biological substances status: Secondary | ICD-10-CM | POA: Insufficient documentation

## 2018-09-05 DIAGNOSIS — Z79899 Other long term (current) drug therapy: Secondary | ICD-10-CM

## 2018-09-05 DIAGNOSIS — Z833 Family history of diabetes mellitus: Secondary | ICD-10-CM | POA: Diagnosis not present

## 2018-09-05 DIAGNOSIS — Z9889 Other specified postprocedural states: Secondary | ICD-10-CM | POA: Diagnosis not present

## 2018-09-05 DIAGNOSIS — F17210 Nicotine dependence, cigarettes, uncomplicated: Secondary | ICD-10-CM | POA: Diagnosis not present

## 2018-09-05 DIAGNOSIS — Z6841 Body Mass Index (BMI) 40.0 and over, adult: Secondary | ICD-10-CM | POA: Diagnosis not present

## 2018-09-05 DIAGNOSIS — I10 Essential (primary) hypertension: Secondary | ICD-10-CM | POA: Diagnosis not present

## 2018-09-05 DIAGNOSIS — G479 Sleep disorder, unspecified: Secondary | ICD-10-CM

## 2018-09-05 DIAGNOSIS — G4733 Obstructive sleep apnea (adult) (pediatric): Secondary | ICD-10-CM | POA: Diagnosis not present

## 2018-09-05 DIAGNOSIS — R7303 Prediabetes: Secondary | ICD-10-CM | POA: Insufficient documentation

## 2018-09-05 DIAGNOSIS — Z8249 Family history of ischemic heart disease and other diseases of the circulatory system: Secondary | ICD-10-CM | POA: Insufficient documentation

## 2018-09-05 MED ORDER — TRAZODONE HCL 50 MG PO TABS: 25.0000 mg | ORAL_TABLET | Freq: Every evening | ORAL | 3 refills | Status: DC | PRN

## 2018-09-05 NOTE — Patient Instructions (Signed)

## 2018-09-05 NOTE — Progress Notes (Signed)
Subjective:    Patient ID: Richard Hale, male    DOB: [birth year], 32 y.o.   MRN: [id number]  HPI       32 year old male seen in follow-up of uncontrolled hypertension.  Patient also recently saw clinical pharmacist for hypertension follow-up as well and patient's ibersartan was increased to 300 mg daily.  Patient reports that his blood pressure when he checks it at work continues to be in the 160s over 92-100.  Patient however does report that his headaches have resolved.  Patient denies any recent headaches or dizziness.  Patient has been unable to obtain his CPAP mask for treatment of his sleep apnea.  Patient states that he could not find the current location of the company where he is supposed to obtain the mask.  Patient reports that he does have difficulty both falling asleep and staying asleep.  Patient states that when he was here and spoke with the pharmacist, he also mentioned this to the pharmacist and the pharmacist was to discuss this with me as well. (There was no mention of this and the note done by the pharmacist on 08/22/2018).      Patient states overall he feels well other than issues with sleep which do cause daytime fatigue.  Patient reports that he is not taking the Topamax that was prescribed for headache prevention.  Patient reports that he did increase his blood pressure medicine, Avapro/Irbesartan to 300 mg daily.  Patient is also taking amlodipine and hydrochlorothiazide.  Patient denies any peripheral edema and no muscle cramping.  Patient did have mild increase in his hemoglobin A1c at his last visit.  Patient denies urinary frequency, no increased thirst and no visual disturbance.  Patient is aware of his need to lose weight and is trying to make dietary changes.  Past Medical History:  Diagnosis Date  . Hypertension   . Morbid obesity (HCC)   . Obstructive sleep apnea    Past Surgical History:  Procedure Laterality Date  . NOSE SURGERY      Family History    Problem Relation Age of Onset  . Hypertension Mother   . Diabetes Maternal Grandmother    Social History   Tobacco Use  . Smoking status: Current Every Day Smoker    Packs/day: 0.50    Types: Cigarettes  . Smokeless tobacco: Never Used  Substance Use Topics  . Alcohol use: Yes    Comment: occ  . Drug use: No   Allergies  Allergen Reactions  . Lisinopril Cough   Current Outpatient Medications on File Prior to Visit  Medication Sig Dispense Refill  . amLODipine (NORVASC) 10 MG tablet Take 1 tablet (10 mg total) by mouth daily. 90 tablet 1  . aspirin EC 325 MG tablet Take 325 mg by mouth every 6 (six) hours as needed for moderate pain.    . hydrochlorothiazide (HYDRODIURIL) 25 MG tablet Take 1 tablet (25 mg total) by mouth daily. Take on tablet in the morning. 90 tablet 1  . irbesartan (AVAPRO) 300 MG tablet Take 1 tablet (300 mg total) by mouth daily. 90 tablet 0  . clindamycin (CLINDAGEL) 1 % gel Apply topically 2 (two) times daily. (Patient not taking: Reported on 09/05/2018) 30 g 0  . Foot Care Products (FOOT SLEEP SUPPORT) MISC 1 each by Does not apply route at bedtime. (Patient not taking: Reported on 09/05/2018) 1 each 0   No current facility-administered medications on file prior to visit.  Review of Systems  Constitutional: Positive for fatigue. Negative for chills and fever.  HENT: Negative for sore throat and trouble swallowing.   Eyes: Negative for photophobia and visual disturbance.  Respiratory: Negative for cough and shortness of breath.   Cardiovascular: Negative for chest pain, palpitations and leg swelling.  Gastrointestinal: Negative for abdominal pain and nausea.  Endocrine: Negative for polydipsia, polyphagia and polyuria.  Genitourinary: Negative for dysuria and frequency.  Musculoskeletal: Negative for back pain, gait problem and joint swelling.  Neurological: Negative for dizziness, light-headedness and headaches.       Objective:    Physical Exam BP (!) 164/110   Pulse 97   Temp 98 F (36.7 C) (Oral)   Ht 6\' 2"  (1.88 m)   Wt (!) 379 lb 3.2 oz (172 kg)   SpO2 94%   BMI 48.69 kg/m Vital signs and nurse's notes reviewed General- morbidly obese male in no acute distress Oropharynx- patient with a narrowed posterior airway secondary to body habitus and large tongue base Neck-supple, no lymphadenopathy, no thyromegaly, no carotid bruit Lungs-clear to auscultation bilaterally Cardiovascular-regular rate and rhythm Abdomen-truncal obesity, soft and nontender Back-no CVA tenderness Extremities-no edema      Assessment & Plan:  1. Uncontrolled hypertension Patient is currently on Avapro 300 mg, amlodipine 10 mg and hydrochlorothiazide 25 mg for control of his blood pressure.  Patient's blood pressure has decreased down to the 160s over 90s to 100s but would like to have patient at 140s or less.  I discussed with the patient that I believe if he obtains the CPAP mask and begins to use his CPAP regularly for treatment of sleep apnea that his blood pressure will likely further decrease.  Patient has had resolution of his chronic headaches since his blood pressure has been better controlled.  Patient will have BMP to assess electrolytes and renal function with the use of his blood pressure medications. - Basic Metabolic Panel  2. OSA (obstructive sleep apnea) Again stressed the importance of patient obtaining CPAP mask and becoming compliant with the use of nightly use of CPAP for treatment of sleep apnea.  Discussed with the patient that this would likely improve his control of his blood pressure, relieve his fatigue and decrease risk of cardiac arrhythmias/then death associated with obstructive sleep apnea.  RN care manager spoke with the patient at today's visit regarding helping him to obtain his CPAP mask.  3. Sleeping difficulty Patient with complaint of difficulty sleeping.  Patient reports difficulty both falling asleep  and staying asleep.  Patient denies depression or anxiety.  I reviewed patient's note from his meeting with the clinical pharmacist and I did not see any mention of issues with sleep difficulty.  I discussed with the patient that use of his CPAP for treatment of sleep apnea would likely resolve his current sleep issues.  I discussed with the patient that I can send in medication to help with his sleep issues but with his untreated sleep apnea, use of medication that may further decrease his respiratory drive could lead to increased morbidity or mortality.  A prescription was sent into patient's pharmacy for him to try trazodone 50 mg, one half to 1 at night as needed for difficulty sleeping if he chooses to do so.  4. Prediabetes Patient with hemoglobin A1c in October of 5.8 consistent with prediabetes.  Patient is encouraged to eat a healthy diet and begin a regular exercise program.  Information on preventing type 2 diabetes was given as part  of AVS at today's visit.  5. Morbid obesity with BMI of 45.0-49.9, adult (HCC) Efforts at weight loss through dietary changes and exercise encouraged  6. Encounter for long-term (current) use of medications Patient will have BMP in follow-up of long-term use of medications - Basic Metabolic Panel  An After Visit Summary was printed and given to the patient.  Return in about 6 weeks (around 10/17/2018) for . Hypertension/sleep apnea.

## 2018-09-05 NOTE — Telephone Encounter (Signed)
Met with the patient when he was in the clinic today.  He said that he has not called [employer] about his CPAP mask because he did not have the phone #.  Provided him with the address and phone # for [facility] in [place] # [phone], [address] [place].

## 2018-09-06 LAB — BASIC METABOLIC PANEL WITH GFR
BUN/Creatinine Ratio: 12 (ref 9–20)
BUN: 12 mg/dL (ref 6–20)
CO2: 25 mmol/L (ref 20–29)
Calcium: 9.3 mg/dL (ref 8.7–10.2)
Chloride: 101 mmol/L (ref 96–106)
Creatinine, Ser: 1.03 mg/dL (ref 0.76–1.27)
GFR calc Af Amer: 111 mL/min/1.73
GFR calc non Af Amer: 96 mL/min/1.73
Glucose: 111 mg/dL — ABNORMAL HIGH (ref 65–99)
Potassium: 3.7 mmol/L (ref 3.5–5.2)
Sodium: 141 mmol/L (ref 134–144)

## 2018-09-11 DIAGNOSIS — G4733 Obstructive sleep apnea (adult) (pediatric): Secondary | ICD-10-CM | POA: Diagnosis not present

## 2018-09-21 ENCOUNTER — Telehealth: Payer: Self-pay | Admitting: *Deleted

## 2018-09-21 NOTE — Telephone Encounter (Signed)
-----   Message from Cammie Fulp, MD sent at 09/09/2018 11:20 PM EST ----- Patient with normal BMP however glucose was 111.  If patient had eaten prior to blood work, then the blood glucose is normal if patient was fasting then glucose of 111 would be considered abnormal and patient needs hemoglobin A1c in follow-up. 

## 2018-09-21 NOTE — Telephone Encounter (Signed)
Left message on voicemail to return call.

## 2018-09-23 ENCOUNTER — Telehealth (INDEPENDENT_AMBULATORY_CARE_PROVIDER_SITE_OTHER): Payer: Self-pay

## 2018-09-23 NOTE — Telephone Encounter (Signed)
-----   Message from Cain Saupe, MD sent at 09/09/2018 11:20 PM EST ----- Patient with normal BMP however glucose was 111.  If patient had eaten prior to blood work, then the blood glucose is normal if patient was fasting then glucose of 111 would be considered abnormal and patient needs hemoglobin A1c in follow-up.

## 2018-09-23 NOTE — Telephone Encounter (Signed)
Left voicemail notifying patient that BMP normal. Glucose 111 could be normal if she had eaten prior to lab draw if not then 111 is abnormal and she will need to tested for diabetes at next OV. Call clinic with any questions. Maryjean Morn, CMA

## 2018-10-21 ENCOUNTER — Ambulatory Visit: Payer: BLUE CROSS/BLUE SHIELD | Attending: Family Medicine | Admitting: Family Medicine

## 2018-10-21 ENCOUNTER — Encounter: Payer: Self-pay | Admitting: Family Medicine

## 2018-10-21 VITALS — BP 178/134 | HR 90 | Temp 98.6°F | Resp 18 | Ht 74.0 in | Wt 385.0 lb

## 2018-10-21 DIAGNOSIS — I10 Essential (primary) hypertension: Secondary | ICD-10-CM | POA: Diagnosis not present

## 2018-10-21 DIAGNOSIS — G4733 Obstructive sleep apnea (adult) (pediatric): Secondary | ICD-10-CM | POA: Diagnosis not present

## 2018-10-21 DIAGNOSIS — I160 Hypertensive urgency: Secondary | ICD-10-CM

## 2018-10-21 DIAGNOSIS — R319 Hematuria, unspecified: Secondary | ICD-10-CM

## 2018-10-21 LAB — POCT URINALYSIS DIP (CLINITEK)
Bilirubin, UA: NEGATIVE
Blood, UA: NEGATIVE
Glucose, UA: NEGATIVE mg/dL
Ketones, POC UA: NEGATIVE mg/dL
Leukocytes, UA: NEGATIVE
Nitrite, UA: NEGATIVE
Spec Grav, UA: 1.015
Urobilinogen, UA: 0.2 U/dL
pH, UA: 6

## 2018-10-21 MED ORDER — IRBESARTAN 300 MG PO TABS: 300.0000 mg | ORAL_TABLET | Freq: Every day | ORAL | 1 refills | Status: DC

## 2018-10-21 MED ORDER — AMLODIPINE BESYLATE 10 MG PO TABS: 10.0000 mg | ORAL_TABLET | Freq: Every day | ORAL | 1 refills | Status: DC

## 2018-10-21 MED ORDER — CIPROFLOXACIN HCL 500 MG PO TABS: 500.0000 mg | ORAL_TABLET | Freq: Two times a day (BID) | ORAL | 0 refills | Status: DC

## 2018-10-21 MED ORDER — OXYCODONE-ACETAMINOPHEN 5-325 MG PO TABS: 1.0000 | ORAL_TABLET | Freq: Four times a day (QID) | ORAL | 0 refills | Status: AC | PRN

## 2018-10-21 MED ORDER — CLONIDINE HCL 0.2 MG PO TABS
0.2000 mg | ORAL_TABLET | Freq: Once | ORAL | Status: AC
  Administered 2018-10-21: 0.2 mg via ORAL

## 2018-10-21 MED ORDER — HYDROCHLOROTHIAZIDE 25 MG PO TABS: 25.0000 mg | ORAL_TABLET | Freq: Every day | ORAL | 1 refills | Status: DC

## 2018-10-21 NOTE — Progress Notes (Signed)
Subjective:    Patient ID: Richard Hale, male    DOB: [birth year], 33 y.o.   MRN: [id number]  HPI       33 yo male who is seen in follow-up of uncontrolled hypertension. Patient was last seen on 09/05/18.  At his last visit, patient was on Avapro 300 mg, amlodipine 10 mg and hydrochlorothiazide 25 mg.  Patient was also advised to obtain a mask so that he can use his CPAP machine as this would also likely help with his hypertension as well.  Patient also at his last visit with hemoglobin A1c of 5.8 consistent with prediabetes.       At today's visit, patient states he has been out of amlodipine for about a week.  He is still taking the irbesartan and hydrochlorothiazide.  Patient also with complaint of pain in his left mid back for more than a month.  Upon questioning, patient also with urinary frequency, discomfort at the end of urination but no visible blood in the urine.  Patient states that his back pain is about a 5 on a 0-to-10 scale and is a twisting sensation.  Patient reports that his back pain occasionally increases while he is at work and patient states that his supervisor has mentioned to him that he might have a kidney stone.      Patient reports that he has been able to obtain tubing and a new mask for his CPAP machine and patient has tried to be compliant with daily use of CPAP.  Patient states that since using the CPAP he no longer has daily headaches.  Patient does have a headache at today's visit but patient states that the hat that he is currently wearing is tight which he believes may be causing the headache therefore he has taken off his hat to see if the headache resolves.  Patient reports that he will be able to pick up his blood pressure medication after this visit.  Past Medical History:  Diagnosis Date  . Hypertension   . Morbid obesity (HCC)   . Obstructive sleep apnea    Past Surgical History:  Procedure Laterality Date  . NOSE SURGERY     Family History  Problem  Relation Age of Onset  . Hypertension Mother   . Diabetes Maternal Grandmother    Social History   Tobacco Use  . Smoking status: Current Every Day Smoker    Packs/day: 0.50    Types: Cigarettes  . Smokeless tobacco: Never Used  Substance Use Topics  . Alcohol use: Yes    Comment: occ  . Drug use: No   Allergies  Allergen Reactions  . Lisinopril Cough      Review of Systems  Constitutional: Positive for fatigue (Patient works third shift). Negative for chills and fever.  HENT: Positive for congestion. Negative for trouble swallowing.   Eyes: Negative for photophobia and visual disturbance.  Respiratory: Negative for cough and shortness of breath.   Cardiovascular: Negative for chest pain, palpitations and leg swelling.  Gastrointestinal: Negative for abdominal pain, constipation, diarrhea and nausea.  Genitourinary: Positive for dysuria, flank pain and frequency. Negative for urgency.  Musculoskeletal: Positive for back pain. Negative for arthralgias, gait problem, joint swelling and myalgias.  Neurological: Positive for headaches (headache currently which he attributes to wearing a cap/hat that is tight). Negative for dizziness.       Objective:   Physical Exam BP (!) 206/140 (BP Location: Right Arm, Patient Position: Sitting, Cuff Size: Large)  Pulse 92   Temp 98.6 F (37 C) (Oral)   Resp 18   Ht 6\' 2"  (1.88 m)   Wt (!) 385 lb (174.6 kg)   SpO2 96%   BMI 49.43 kg/m  nurses notes and vital signs reviewed General- Obese male in NAD sitting on exam table Neck-large neck size, no lymphadenopathy, no JVD Lungs-clear to auscultation bilaterally Cardiovascular-regular rate and rhythm Abdomen-truncal obesity, soft and nontender Back-patient with left CVA tenderness with light touch and patient with severe discomfort with pressure over the area-patient attempts to withdraw from stimulus over the left kidney area/mid back Extremities-no edema     Assessment & Plan:   1. Hypertensive urgency Patient's blood pressure is elevated at 206/140 as he has been out of some of his blood pressure medications.  Patient was given clonidine 0.2 mg x 1 here in the office with reduction of blood pressure to 178/134.  Patient was given refills of his current blood pressure medications and patient will obtain the medication at his pharmacy and has been asked to take his medication and then recheck his blood pressure at home and contact the office if his home blood pressure is still greater than 160/90.  Patient will have BMP at today's visit in follow-up of his hypertension as well as medication use. - cloNIDine (CATAPRES) tablet 0.2 mg - Basic Metabolic Panel  2. Hematuria, unspecified type Patient had hematuria on his urinalysis done at his last visit and patient now complains of more than 6 weeks of left-sided back pain and discomfort with urination.  Patient with left-sided CVA tenderness on exam.  Patient was asked to repeat urinalysis to look for hematuria/signs of infection.  Unfortunately, patient's urinalysis results were not available while he was still here at the clinic.  Patient was placed on Cipro 500 mg twice daily for possible urinary tract infection and due to patient's back pain and hematuria on prior urinalysis, it is suspected that patient may have a partially obstructing kidney stone or kidney stone within the renal parenchyma leading to hematuria and back pain.  Medical assistant will contact patient's insurance company regarding approval for renal stone protocol CT of the abdomen and pelvis.  Patient was given prescription for oxycodone acetaminophen in case of a severe increase in back pain associated with a kidney stone and patient should seek medical attention if he is in severe pain and/or having difficulty with urination.  Will check BMP to assess renal function. - CT RENAL STONE STUDY; Future - Basic Metabolic Panel - ciprofloxacin (CIPRO) 500 MG tablet;  Take 1 tablet (500 mg total) by mouth 2 (two) times daily.  Dispense: 14 tablet; Refill: 0 - oxyCODONE-acetaminophen (PERCOCET/ROXICET) 5-325 MG tablet; Take 1-2 tablets by mouth every 6 (six) hours as needed for up to 5 days for severe pain.  Dispense: 40 tablet; Refill: 0 - POCT URINALYSIS DIP (CLINITEK)  3. Uncontrolled hypertension Patient with a history of difficult to control hypertension but patient reports that with recent use of irbesartan 300 mg, hydrochlorothiazide 25 mg and amlodipine 10 mg, his blood pressure was controlled until he ran out of 1 of the medications.  Patient is given refills of all of his medications at today's visit and patient will take the medication that he is currently out of as soon as he picks it up from the pharmacy today and continue daily blood pressure medication.  Patient reports that he has a blood pressure monitor at home but patient is also encouraged to bring his  monitor into the clinic to have blood pressure monitor and blood pressure checked by the nurse or clinical pharmacist to make sure that his blood pressure monitor is accurately measuring his home blood pressure. - irbesartan (AVAPRO) 300 MG tablet; Take 1 tablet (300 mg total) by mouth daily.  Dispense: 90 tablet; Refill: 1 - hydrochlorothiazide (HYDRODIURIL) 25 MG tablet; Take 1 tablet (25 mg total) by mouth daily. Take on tablet in the morning.  Dispense: 90 tablet; Refill: 1 - amLODipine (NORVASC) 10 MG tablet; Take 1 tablet (10 mg total) by mouth daily.  Dispense: 90 tablet; Refill: 1  4. OSA (obstructive sleep apnea) Patient with morbid obesity and obstructive sleep apnea and I discussed in the past that patient's obstructive sleep apnea was likely contributing to his uncontrolled hypertension in addition to fatigue and continued weight gain.  Patient reports that he has now obtained a new mask and tubing and is able to utilize his home CPAP.  An After Visit Summary was printed and given to the  patient.  Allergies as of 10/21/2018      Reactions   Lisinopril Cough      Medication List       Accurate as of October 21, 2018 11:59 PM. Always use your most recent med list.        amLODipine 10 MG tablet Commonly known as:  NORVASC Take 1 tablet (10 mg total) by mouth daily.   aspirin EC 325 MG tablet Take 325 mg by mouth every 6 (six) hours as needed for moderate pain.   ciprofloxacin 500 MG tablet Commonly known as:  CIPRO Take 1 tablet (500 mg total) by mouth 2 (two) times daily.   hydrochlorothiazide 25 MG tablet Commonly known as:  HYDRODIURIL Take 1 tablet (25 mg total) by mouth daily. Take on tablet in the morning.   irbesartan 300 MG tablet Commonly known as:  AVAPRO Take 1 tablet (300 mg total) by mouth daily.   oxyCODONE-acetaminophen 5-325 MG tablet Commonly known as:  PERCOCET/ROXICET Take 1-2 tablets by mouth every 6 (six) hours as needed for up to 5 days for severe pain.   traZODone 50 MG tablet Commonly known as:  DESYREL Take 0.5-1 tablets (25-50 mg total) by mouth at bedtime as needed for sleep.      Return in about 1 week (around 10/28/2018).

## 2018-10-22 ENCOUNTER — Encounter: Payer: Self-pay | Admitting: Family Medicine

## 2018-10-22 LAB — BASIC METABOLIC PANEL WITH GFR
BUN/Creatinine Ratio: 12 (ref 9–20)
BUN: 11 mg/dL (ref 6–20)
CO2: 26 mmol/L (ref 20–29)
Calcium: 9.5 mg/dL (ref 8.7–10.2)
Chloride: 100 mmol/L (ref 96–106)
Creatinine, Ser: 0.92 mg/dL (ref 0.76–1.27)
GFR calc Af Amer: 128 mL/min/1.73
GFR calc non Af Amer: 110 mL/min/1.73
Glucose: 82 mg/dL (ref 65–99)
Potassium: 4 mmol/L (ref 3.5–5.2)
Sodium: 143 mmol/L (ref 134–144)

## 2018-10-26 ENCOUNTER — Telehealth: Payer: Self-pay | Admitting: *Deleted

## 2018-10-26 NOTE — Telephone Encounter (Signed)
-----   Message from Cain Saupe, MD sent at 10/22/2018  7:09 PM EST ----- Please notify patient that his basic metabolic panel was normal

## 2018-10-26 NOTE — Telephone Encounter (Signed)
Medical Assistant left message on patient's home and cell voicemail. Voicemail states to give a call back to Nubia with [facility] at [phone]. Patient is aware of labs being normal 

## 2018-12-14 ENCOUNTER — Emergency Department (HOSPITAL_COMMUNITY): Payer: BLUE CROSS/BLUE SHIELD

## 2018-12-14 ENCOUNTER — Other Ambulatory Visit: Payer: Self-pay

## 2018-12-14 ENCOUNTER — Encounter (HOSPITAL_COMMUNITY): Payer: Self-pay | Admitting: *Deleted

## 2018-12-14 ENCOUNTER — Observation Stay (HOSPITAL_COMMUNITY)
Admission: EM | Admit: 2018-12-14 | Discharge: 2018-12-15 | Disposition: A | Discharge status: Home / Self Care | Payer: BLUE CROSS/BLUE SHIELD | Attending: Internal Medicine | Admitting: Internal Medicine

## 2018-12-14 DIAGNOSIS — I160 Hypertensive urgency: Secondary | ICD-10-CM | POA: Diagnosis not present

## 2018-12-14 DIAGNOSIS — I10 Essential (primary) hypertension: Secondary | ICD-10-CM | POA: Diagnosis not present

## 2018-12-14 DIAGNOSIS — G4733 Obstructive sleep apnea (adult) (pediatric): Secondary | ICD-10-CM | POA: Diagnosis present

## 2018-12-14 DIAGNOSIS — R0602 Shortness of breath: Secondary | ICD-10-CM | POA: Diagnosis not present

## 2018-12-14 DIAGNOSIS — Z7982 Long term (current) use of aspirin: Secondary | ICD-10-CM | POA: Diagnosis not present

## 2018-12-14 DIAGNOSIS — F17210 Nicotine dependence, cigarettes, uncomplicated: Secondary | ICD-10-CM | POA: Diagnosis not present

## 2018-12-14 DIAGNOSIS — R05 Cough: Secondary | ICD-10-CM | POA: Diagnosis not present

## 2018-12-14 DIAGNOSIS — K5730 Diverticulosis of large intestine without perforation or abscess without bleeding: Secondary | ICD-10-CM | POA: Diagnosis not present

## 2018-12-14 DIAGNOSIS — R0789 Other chest pain: Secondary | ICD-10-CM | POA: Diagnosis not present

## 2018-12-14 DIAGNOSIS — Z79899 Other long term (current) drug therapy: Secondary | ICD-10-CM | POA: Insufficient documentation

## 2018-12-14 DIAGNOSIS — R079 Chest pain, unspecified: Secondary | ICD-10-CM

## 2018-12-14 LAB — URINALYSIS, ROUTINE W REFLEX MICROSCOPIC
Bilirubin Urine: NEGATIVE
Glucose, UA: NEGATIVE mg/dL
Hgb urine dipstick: NEGATIVE
Ketones, ur: NEGATIVE mg/dL
LEUKOCYTE UA: NEGATIVE
Nitrite: NEGATIVE
PH: 6 (ref 5.0–8.0)
Protein, ur: 30 mg/dL — AB
Specific Gravity, Urine: 1.019 (ref 1.005–1.030)

## 2018-12-14 LAB — RAPID URINE DRUG SCREEN, HOSP PERFORMED
Amphetamines: NOT DETECTED
BARBITURATES: NOT DETECTED
Benzodiazepines: NOT DETECTED
Cocaine: NOT DETECTED
Opiates: POSITIVE — AB
Tetrahydrocannabinol: NOT DETECTED

## 2018-12-14 LAB — CBC
HCT: 44.8 % (ref 39.0–52.0)
Hemoglobin: 14.7 g/dL (ref 13.0–17.0)
MCH: 29.2 pg (ref 26.0–34.0)
MCHC: 32.8 g/dL (ref 30.0–36.0)
MCV: 89.1 fL (ref 80.0–100.0)
Platelets: 405 10*3/uL — ABNORMAL HIGH (ref 150–400)
RBC: 5.03 MIL/uL (ref 4.22–5.81)
RDW: 13.1 % (ref 11.5–15.5)
WBC: 8.4 10*3/uL (ref 4.0–10.5)
nRBC: 0 % (ref 0.0–0.2)

## 2018-12-14 LAB — COMPREHENSIVE METABOLIC PANEL
ALT: 21 U/L (ref 0–44)
AST: 31 U/L (ref 15–41)
Albumin: 3.7 g/dL (ref 3.5–5.0)
Alkaline Phosphatase: 93 U/L (ref 38–126)
Anion gap: 5 (ref 5–15)
BUN: 15 mg/dL (ref 6–20)
CO2: 27 mmol/L (ref 22–32)
Calcium: 8.8 mg/dL — ABNORMAL LOW (ref 8.9–10.3)
Chloride: 106 mmol/L (ref 98–111)
Creatinine, Ser: 1.19 mg/dL (ref 0.61–1.24)
GFR calc Af Amer: >60 mL/min (ref >60)
GFR calc non Af Amer: >60 mL/min (ref >60)
Glucose, Bld: 107 mg/dL — ABNORMAL HIGH (ref 70–99)
Potassium: 3.5 mmol/L (ref 3.5–5.1)
Sodium: 138 mmol/L (ref 135–145)
Total Bilirubin: 0.4 mg/dL (ref 0.3–1.2)
Total Protein: 7.4 g/dL (ref 6.5–8.1)

## 2018-12-14 LAB — I-STAT TROPONIN, ED
Troponin i, poc: 0.08 ng/mL (ref 0.00–0.08)
Troponin i, poc: 0.07 ng/mL (ref 0.00–0.08)

## 2018-12-14 LAB — LIPASE, BLOOD: Lipase: 31 U/L (ref 11–51)

## 2018-12-14 LAB — BRAIN NATRIURETIC PEPTIDE: B Natriuretic Peptide: 98.8 pg/mL (ref 0.0–100.0)

## 2018-12-14 LAB — D-DIMER, QUANTITATIVE: D-Dimer, Quant: 0.32 ug/mL-FEU (ref 0.00–0.50)

## 2018-12-14 MED ORDER — TRAZODONE HCL 50 MG PO TABS: 25.0000 mg | ORAL_TABLET | Freq: Every evening | ORAL | Status: DC | PRN

## 2018-12-14 MED ORDER — ONDANSETRON HCL 4 MG/2ML IJ SOLN: 4.0000 mg | Freq: Four times a day (QID) | INTRAMUSCULAR | Status: DC | PRN

## 2018-12-14 MED ORDER — CLONIDINE HCL 0.2 MG PO TABS
0.2000 mg | ORAL_TABLET | Freq: Once | ORAL | Status: AC
  Administered 2018-12-14: 0.2 mg via ORAL
  Filled 2018-12-14: qty 1

## 2018-12-14 MED ORDER — ENOXAPARIN SODIUM 80 MG/0.8ML ~~LOC~~ SOLN: 80.0000 mg | Freq: Every day | SUBCUTANEOUS | Status: DC

## 2018-12-14 MED ORDER — HYDROCHLOROTHIAZIDE 25 MG PO TABS
25.0000 mg | ORAL_TABLET | Freq: Every day | ORAL | Status: DC
  Administered 2018-12-15: 25 mg via ORAL
  Filled 2018-12-14: qty 1

## 2018-12-14 MED ORDER — HYDROMORPHONE HCL 1 MG/ML IJ SOLN
1.0000 mg | Freq: Once | INTRAMUSCULAR | Status: AC
  Administered 2018-12-14: 1 mg via INTRAVENOUS
  Filled 2018-12-14: qty 1

## 2018-12-14 MED ORDER — BENZONATATE 100 MG PO CAPS
200.0000 mg | ORAL_CAPSULE | Freq: Three times a day (TID) | ORAL | Status: DC | PRN
  Administered 2018-12-15: 200 mg via ORAL
  Filled 2018-12-14: qty 2

## 2018-12-14 MED ORDER — IOPAMIDOL (ISOVUE-370) INJECTION 76%
INTRAVENOUS | Status: AC
  Filled 2018-12-14: qty 100

## 2018-12-14 MED ORDER — IRBESARTAN 150 MG PO TABS
300.0000 mg | ORAL_TABLET | Freq: Every day | ORAL | Status: DC
  Administered 2018-12-14 – 2018-12-15 (×2): 300 mg via ORAL
  Filled 2018-12-14: qty 1
  Filled 2018-12-14: qty 2

## 2018-12-14 MED ORDER — MORPHINE SULFATE (PF) 4 MG/ML IV SOLN
4.0000 mg | Freq: Once | INTRAVENOUS | Status: AC
  Administered 2018-12-14: 4 mg via INTRAVENOUS
  Filled 2018-12-14: qty 1

## 2018-12-14 MED ORDER — IOPAMIDOL (ISOVUE-370) INJECTION 76%
100.0000 mL | Freq: Once | INTRAVENOUS | Status: AC | PRN
  Administered 2018-12-14: 100 mL via INTRAVENOUS

## 2018-12-14 MED ORDER — AMLODIPINE BESYLATE 5 MG PO TABS
10.0000 mg | ORAL_TABLET | Freq: Every day | ORAL | Status: DC
  Administered 2018-12-15: 10 mg via ORAL
  Filled 2018-12-14: qty 2

## 2018-12-14 MED ORDER — ACETAMINOPHEN 325 MG PO TABS
650.0000 mg | ORAL_TABLET | Freq: Four times a day (QID) | ORAL | Status: DC | PRN
  Administered 2018-12-15 (×2): 650 mg via ORAL
  Filled 2018-12-14 (×2): qty 2

## 2018-12-14 MED ORDER — SODIUM CHLORIDE 0.9% FLUSH
3.0000 mL | Freq: Once | INTRAVENOUS | Status: AC
  Administered 2018-12-14: 3 mL via INTRAVENOUS

## 2018-12-14 MED ORDER — AMLODIPINE BESYLATE 5 MG PO TABS
10.0000 mg | ORAL_TABLET | Freq: Once | ORAL | Status: AC
  Administered 2018-12-14: 10 mg via ORAL
  Filled 2018-12-14: qty 2

## 2018-12-14 MED ORDER — ONDANSETRON HCL 4 MG PO TABS: 4.0000 mg | ORAL_TABLET | Freq: Four times a day (QID) | ORAL | Status: DC | PRN

## 2018-12-14 MED ORDER — ACETAMINOPHEN 650 MG RE SUPP: 650.0000 mg | Freq: Four times a day (QID) | RECTAL | Status: DC | PRN

## 2018-12-14 MED ORDER — LABETALOL HCL 5 MG/ML IV SOLN
20.0000 mg | Freq: Once | INTRAVENOUS | Status: AC
  Administered 2018-12-14: 20 mg via INTRAVENOUS
  Filled 2018-12-14: qty 4

## 2018-12-14 MED ORDER — LABETALOL HCL 5 MG/ML IV SOLN: 20.0000 mg | INTRAVENOUS | Status: DC | PRN

## 2018-12-14 NOTE — ED Provider Notes (Signed)
MSE was initiated and I personally evaluated the patient and placed orders (if any) at  5:26 PM on December 14, 2018.  The patient appears stable so that the remainder of the MSE may be completed by another provider.  Patient reports he has had a cough for about 3 days.  He reports that he developed a severe pain in his right side of his chest.  He indicates his lateral chest and upper abdomen.  He reports it is much worse with trying to take a deep breath.  He does feel short of breath especially with coughing.  He has not had a fever that he is aware of.  Patient has not taken his blood pressure medication for about 2 days.  Blood pressure is significantly elevated.  He denies pain or swelling in his legs.  Patient is alert and appropriate.  Mild increased work of breathing at rest.  He appears uncomfortable.  Morbid obesity.  Breath sounds are diffusely soft.  Heart regular.  Abdomen soft.  Denies calf pain to palpation.  No significant lower extremity edema.  Patient presents with chest pain and very poorly controlled hypertension.  Blood pressure 185/123.  He has had a cough.  Will need evaluation for chest pain and hypertension with differential diagnosis including pneumonia/pleurisy/PE/vascular emergency.   Arby Barrette, MD 12/14/18 1731

## 2018-12-14 NOTE — H&P (Signed)
History and Physical    Richard Hale IAX:[id number] DOB: 06/07/1986 DOA: 12/14/2018  PCP: Cain Saupe, MD  Patient coming from: Home  I have personally briefly reviewed patient's old medical records in [facility]  Chief Complaint: CP  HPI: Richard Hale is a 33 y.o. male with medical history significant of HTN, OSA.  Patient presents to the ED with c/o 3 day h/o cough, has developed severe R sided chest / flank pain with coughing.  Pain worse with deep breath.  No fever that he is aware of, cough is non-productive.  No leg swelling.  2 days ago he ran out of his BP meds.   ED Course: Initial BP is 200/128! Trop neg x2.  BP controlled with resumption of home meds + clonidine + labetalol IV.   Review of Systems: As per HPI otherwise 10 point review of systems negative.   Past Medical History:  Diagnosis Date  . Hypertension   . Morbid obesity (HCC)   . Obstructive sleep apnea     Past Surgical History:  Procedure Laterality Date  . NOSE SURGERY       reports that he has been smoking cigarettes. He has been smoking about 0.50 packs per day. He has never used smokeless tobacco. He reports current alcohol use. He reports that he does not use drugs.  Allergies  Allergen Reactions  . Lisinopril Cough    Family History  Problem Relation Age of Onset  . Hypertension Mother   . Diabetes Maternal Grandmother      Prior to Admission medications   Medication Sig Start Date End Date Taking? Authorizing Provider  amLODipine (NORVASC) 10 MG tablet Take 1 tablet (10 mg total) by mouth daily. 10/21/18  Yes Fulp, Cammie, MD  aspirin EC 325 MG tablet Take 325 mg by mouth every 6 (six) hours as needed for moderate pain.   Yes [provider]  hydrochlorothiazide (HYDRODIURIL) 25 MG tablet Take 1 tablet (25 mg total) by mouth daily. Take on tablet in the morning. 10/21/18  Yes Fulp, Cammie, MD  irbesartan (AVAPRO) 300 MG tablet Take 1 tablet (300 mg total) by  mouth daily. 10/21/18  Yes Fulp, Cammie, MD  traZODone (DESYREL) 50 MG tablet Take 0.5-1 tablets (25-50 mg total) by mouth at bedtime as needed for sleep. 09/05/18  Yes Fulp, Cammie, MD  ciprofloxacin (CIPRO) 500 MG tablet Take 1 tablet (500 mg total) by mouth 2 (two) times daily. Patient not taking: Reported on 12/14/2018 10/21/18   Cain Saupe, MD    Physical Exam: Vitals:   12/14/18 2002 12/14/18 2044 12/14/18 2122 12/14/18 2313  BP: (!) 167/104 (!) 153/90 (!) 157/101 (!) 159/115  Pulse:   72 71  Resp:    18  Temp:      TempSrc:      SpO2:    (!) 5%  Weight:      Height:        Constitutional: NAD, calm, comfortable Eyes: PERRL, lids and conjunctivae normal ENMT: Mucous membranes are moist. Posterior pharynx clear of any exudate or lesions.Normal dentition.  Neck: normal, supple, no masses, no thyromegaly Respiratory: clear to auscultation bilaterally, no wheezing, no crackles. Normal respiratory effort. No accessory muscle use.  Cardiovascular: Regular rate and rhythm, no murmurs / rubs / gallops. No extremity edema. 2+ pedal pulses. No carotid bruits.  Abdomen: no tenderness, no masses palpated. No hepatosplenomegaly. Bowel sounds positive.  Musculoskeletal: no clubbing / cyanosis. No joint deformity upper and lower extremities.  Good ROM, no contractures. Normal muscle tone.  Skin: no rashes, lesions, ulcers. No induration Neurologic: CN 2-12 grossly intact. Sensation intact, DTR normal. Strength 5/5 in all 4.  Psychiatric: Normal judgment and insight. Alert and oriented x 3. Normal mood.    Labs on Admission: I have personally reviewed following labs and imaging studies  CBC: Recent Labs  Lab 12/14/18 1717  WBC 8.4  HGB 14.7  HCT 44.8  MCV 89.1  PLT 405*   Basic Metabolic Panel: Recent Labs  Lab 12/14/18 1723  NA 138  K 3.5  CL 106  CO2 27  GLUCOSE 107*  BUN 15  CREATININE 1.19  CALCIUM 8.8*   GFR: Estimated Creatinine Clearance: 144 mL/min (by C-G formula  based on SCr of 1.19 mg/dL). Liver Function Tests: Recent Labs  Lab 12/14/18 1723  AST 31  ALT 21  ALKPHOS 93  BILITOT 0.4  PROT 7.4  ALBUMIN 3.7   Recent Labs  Lab 12/14/18 1723  LIPASE 31   No results for input(s): AMMONIA in the last 168 hours. Coagulation Profile: No results for input(s): INR, PROTIME in the last 168 hours. Cardiac Enzymes: No results for input(s): CKTOTAL, CKMB, CKMBINDEX, TROPONINI in the last 168 hours. BNP (last 3 results) No results for input(s): PROBNP in the last 8760 hours. HbA1C: No results for input(s): HGBA1C in the last 72 hours. CBG: No results for input(s): GLUCAP in the last 168 hours. Lipid Profile: No results for input(s): CHOL, HDL, LDLCALC, TRIG, CHOLHDL, LDLDIRECT in the last 72 hours. Thyroid Function Tests: No results for input(s): TSH, T4TOTAL, FREET4, T3FREE, THYROIDAB in the last 72 hours. Anemia Panel: No results for input(s): VITAMINB12, FOLATE, FERRITIN, TIBC, IRON, RETICCTPCT in the last 72 hours. Urine analysis:    Component Value Date/Time   COLORURINE YELLOW 12/14/2018 1823   APPEARANCEUR CLEAR 12/14/2018 1823   LABSPEC 1.019 12/14/2018 1823   PHURINE 6.0 12/14/2018 1823   GLUCOSEU NEGATIVE 12/14/2018 1823   HGBUR NEGATIVE 12/14/2018 1823   BILIRUBINUR NEGATIVE 12/14/2018 1823   BILIRUBINUR negative 10/21/2018 0949   KETONESUR NEGATIVE 12/14/2018 1823   PROTEINUR 30 (A) 12/14/2018 1823   UROBILINOGEN 0.2 10/21/2018 0949   NITRITE NEGATIVE 12/14/2018 1823   LEUKOCYTESUR NEGATIVE 12/14/2018 1823    Radiological Exams on Admission: Dg Chest 2 View  Result Date: 12/14/2018 CLINICAL DATA:  Right axillary pain. Mild right anterior chest pain. Shortness of breath. Dry cough for the past 2 days. Smoker. EXAM: CHEST - 2 VIEW COMPARISON:  04/23/2017. FINDINGS: Normal sized heart. Clear lungs. Stable mild diffuse peribronchial thickening. Normal appearing bones. No visible fracture or pneumothorax. IMPRESSION: No acute  disease. Stable mild chronic bronchitic changes. Electronically Signed   By: Beckie Salts M.D.   On: 12/14/2018 18:11    EKG: Independently reviewed.  Assessment/Plan Principal Problem:   Hypertensive urgency Active Problems:   Obstructive sleep apnea    1. HTN urgency - 1. Continue home BP meds which were resumed in ED including Norvasc, HCTZ, and irbesartan 2. PRN labetalol 3. Serial trops 2. Cough - 1. suspect HTN induced, possibly mild acute diastolic CHF 2. Though I cant entirely rule out medication induced as he does have h/o ACEi cough, he is now on an ARB and this is associated with a much lower rate of cough. 3. If persists despite treatment of BP though, then would consider discontinuing ARB. 4. Tessalon PRN ordered 3. OSA - 1. CPAP per home settings  DVT prophylaxis: Lovenox Code Status: Full Family  Communication: Family at bedside Disposition Plan: Home after admit Consults called: None Admission status: Place in 31    GARDNER, JARED M. DO [facility]  How to contact the [facility] Attending or Consulting provider 7A - 7P or covering provider during after hours 7P -7A, for this patient?  1. Check the care team in [facility] and look for a) attending/consulting [facility] provider listed and b) the [facility] team listed 2. Log into [email or web address]  Amion Physician Scheduling and messaging for groups and whole hospitals  On call and physician scheduling software for group practices, residents, hospitalists and other medical providers for call, clinic, rotation and shift schedules. On[employer] is a hospital-wide system for scheduling doctors and paging doctors on call. EasyPlot is for scientific plotting and data analysis.  [email or web address]  and use [place]'s universal password to access. If you do not have the password, please contact the hospital operator.  3. Locate the [facility] provider you are looking for under [facility] and page to a number that you can be directly  reached. 4. If you still have difficulty reaching the provider, please page the [facility] (Director on Call) for the Hospitalists listed on amion for assistance.  12/14/2018, 11:57 PM

## 2018-12-14 NOTE — ED Triage Notes (Signed)
PT reports having increased Rt side pain when coughing. Pt reports 3days of coughing. Pt BP 185/123 . Pt  Did not take BP meds this AM

## 2018-12-14 NOTE — ED Provider Notes (Signed)
[facility] EMERGENCY DEPARTMENT Provider Note   CSN: [id number] Arrival date & time: 12/14/18  1639    History   Chief Complaint Chief Complaint  Patient presents with  . Chest Pain    HPI Richard Hale is a 33 y.o. male.     HPI Patient reports cough for about 3 days.  He is got severe right-sided chest pain.  He reports is worse with a deep breath.  He has felt short of breath.  No fever that he is aware of.  He has been out of blood pressure medications for 2 days.  He denies swelling or pain in his legs. Past Medical History:  Diagnosis Date  . Hypertension   . Morbid obesity (HCC)   . Obstructive sleep apnea     Patient Active Problem List   Diagnosis Date Noted  . Hypertension 09/05/2018  . Obstructive sleep apnea 09/05/2018  . Morbid obesity (HCC) 09/05/2018  . Prediabetes 09/05/2018    Past Surgical History:  Procedure Laterality Date  . NOSE SURGERY          Home Medications    Prior to Admission medications   Medication Sig Start Date End Date Taking? Authorizing Provider  amLODipine (NORVASC) 10 MG tablet Take 1 tablet (10 mg total) by mouth daily. 10/21/18   Fulp, Cammie, MD  aspirin EC 325 MG tablet Take 325 mg by mouth every 6 (six) hours as needed for moderate pain.    [provider]  ciprofloxacin (CIPRO) 500 MG tablet Take 1 tablet (500 mg total) by mouth 2 (two) times daily. 10/21/18   Fulp, Cammie, MD  hydrochlorothiazide (HYDRODIURIL) 25 MG tablet Take 1 tablet (25 mg total) by mouth daily. Take on tablet in the morning. 10/21/18   Fulp, Cammie, MD  irbesartan (AVAPRO) 300 MG tablet Take 1 tablet (300 mg total) by mouth daily. 10/21/18   Fulp, Cammie, MD  traZODone (DESYREL) 50 MG tablet Take 0.5-1 tablets (25-50 mg total) by mouth at bedtime as needed for sleep. 09/05/18   Cain Saupe, MD    Family History Family History  Problem Relation Age of Onset  . Hypertension Mother   . Diabetes Maternal Grandmother      Social History Social History   Tobacco Use  . Smoking status: Current Every Day Smoker    Packs/day: 0.50    Types: Cigarettes  . Smokeless tobacco: Never Used  Substance Use Topics  . Alcohol use: Yes    Comment: occ  . Drug use: No     Allergies   Lisinopril   Review of Systems Review of Systems 10 Systems reviewed and are negative for acute change except as noted in the HPI.   Physical Exam Updated Vital Signs BP (!) 166/122 (BP Location: Left Arm)   Pulse 67   Temp 98.8 F (37.1 C) (Oral)   Resp 16   Ht 6\' 2"  (1.88 m)   Wt (!) 159.7 kg   SpO2 96%   BMI 45.19 kg/m   Physical Exam Constitutional:      Comments: Alert and nontoxic.  Appears to be in significant pain.  Sitting upright in the stretcher.  Slightly diaphoretic.  Mental status is clear.  Morbid obesity.  HENT:     Head: Normocephalic and atraumatic.     Mouth/Throat:     Mouth: Mucous membranes are moist.     Pharynx: Oropharynx is clear.  Cardiovascular:     Rate and Rhythm: Normal rate and  regular rhythm.  Pulmonary:     Comments: Breath sounds are distant bilaterally.  Patient has extremely thick chest wall.  No gross wheeze rhonchi or rales appreciable.  Some chest wall tenderness to palpation on the right.  No rash. Abdominal:     General: There is no distension.     Palpations: Abdomen is soft.     Tenderness: There is no abdominal tenderness. There is no guarding.  Musculoskeletal: Normal range of motion.        General: No swelling or tenderness.  Skin:    General: Skin is warm and dry.  Neurological:     General: No focal deficit present.     Mental Status: He is oriented to person, place, and time.     Coordination: Coordination normal.  Psychiatric:        Mood and Affect: Mood normal.       ED Treatments / Results  Labs (all labs ordered are listed, but only abnormal results are displayed) Labs Reviewed  CBC - Abnormal; Notable for the following components:       Result Value   Platelets 405 (*)    All other components within normal limits  COMPREHENSIVE METABOLIC PANEL - Abnormal; Notable for the following components:   Glucose, Bld 107 (*)    Calcium 8.8 (*)    All other components within normal limits  URINALYSIS, ROUTINE W REFLEX MICROSCOPIC - Abnormal; Notable for the following components:   Protein, ur 30 (*)    Bacteria, UA RARE (*)    All other components within normal limits  RAPID URINE DRUG SCREEN, HOSP PERFORMED - Abnormal; Notable for the following components:   Opiates POSITIVE (*)    All other components within normal limits  LIPASE, BLOOD  BRAIN NATRIURETIC PEPTIDE  D-DIMER, QUANTITATIVE (NOT AT [facility])  I-STAT TROPONIN, ED    EKG EKG Interpretation  Date/Time:  Wednesday December 14 2018 17:21:57 EST Ventricular Rate:  76 PR Interval:  168 QRS Duration: 116 QT Interval:  390 QTC Calculation: 438 R Axis:   -23 Text Interpretation:  Normal sinus rhythm with sinus arrhythmia Left ventricular hypertrophy with QRS widening ST & T wave abnormality, consider lateral ischemia Abnormal ECG no sig change from old. Confirmed by Arby Barrette [phone]) on 12/14/2018 5:56:24 PM   Radiology Dg Chest 2 View  Result Date: 12/14/2018 CLINICAL DATA:  Right axillary pain. Mild right anterior chest pain. Shortness of breath. Dry cough for the past 2 days. Smoker. EXAM: CHEST - 2 VIEW COMPARISON:  04/23/2017. FINDINGS: Normal sized heart. Clear lungs. Stable mild diffuse peribronchial thickening. Normal appearing bones. No visible fracture or pneumothorax. IMPRESSION: No acute disease. Stable mild chronic bronchitic changes. Electronically Signed   By: Beckie Salts M.D.   On: 12/14/2018 18:11    Procedures Procedures (including critical care time) CRITICAL CARE Performed by: Arby Barrette   Total critical care time: 45 minutes  Critical care time was exclusive of separately billable procedures and treating other patients.  Critical care  was necessary to treat or prevent imminent or life-threatening deterioration.  Critical care was time spent personally by me on the following activities: development of treatment plan with patient and/or surrogate as well as nursing, discussions with consultants, evaluation of patient's response to treatment, examination of patient, obtaining history from patient or surrogate, ordering and performing treatments and interventions, ordering and review of laboratory studies, ordering and review of radiographic studies, pulse oximetry and re-evaluation of patient's condition. Medications Ordered in ED  Medications  irbesartan (AVAPRO) tablet 300 mg (300 mg Oral Given 12/14/18 1832)  iopamidol (ISOVUE-370) 76 % injection (has no administration in time range)  labetalol (NORMODYNE,TRANDATE) injection 20 mg (has no administration in time range)  sodium chloride flush (NS) 0.9 % injection 3 mL (3 mLs Intravenous Given 12/14/18 1738)  cloNIDine (CATAPRES) tablet 0.2 mg (0.2 mg Oral Given 12/14/18 1735)  amLODipine (NORVASC) tablet 10 mg (10 mg Oral Given 12/14/18 1735)  morphine 4 MG/ML injection 4 mg (4 mg Intravenous Given 12/14/18 1733)  labetalol (NORMODYNE,TRANDATE) injection 20 mg (20 mg Intravenous Given 12/14/18 1853)  HYDROmorphone (DILAUDID) injection 1 mg (1 mg Intravenous Given 12/14/18 1923)  iopamidol (ISOVUE-370) 76 % injection 100 mL (100 mLs Intravenous Contrast Given 12/14/18 1922)     Initial Impression / Assessment and Plan / ED Course  I have reviewed the triage vital signs and the nursing notes.  Pertinent labs & imaging results that were available during my care of the patient were reviewed by me and considered in my medical decision making (see chart for details).  Clinical Course as of Dec 13 1956  Wed Dec 14, 2018  1843 Patient reports no improvement with morphine.  Blood pressures remain elevated.  Will add labetalol and dissection study.  Mental status clear.  No respiratory distress at  rest.   [MP]    Clinical Course User Index [MP] Arby Barrette, MD      Patient with significant hypertension diastolics in the 120s to 130s.  Patient has been noncompliant with medications for 2 days.  He does have severe right sided chest pain.  Morphine did not help alleviate it.  First troponin at 0.8.  EKG does not show MI pattern.  CT angios obtained to rule out dissection.  Patient has been treated with labetalol but diastolic pressures remain significantly elevated.  Patient will need admission for hypertensive urgency and chest pain rule out ischemia.  Patient does not have signs of pneumonia on plain film chest x-ray.  CT angios results pending.  Final Clinical Impressions(s) / ED Diagnoses   Final diagnoses:  Hypertensive urgency  Chest pain, unspecified type    ED Discharge Orders    None       Arby Barrette, MD 12/14/18 1958

## 2018-12-14 NOTE — ED Provider Notes (Signed)
  Physical Exam  BP (!) 157/101   Pulse 72   Temp 98.8 F (37.1 C) (Oral)   Resp 16   Ht 6\' 2"  (1.88 m)   Wt (!) 159.7 kg   SpO2 96%   BMI 45.19 kg/m   Physical Exam  ED Course/Procedures   Clinical Course as of Dec 13 2232  Wed Dec 14, 2018  1843 Patient reports no improvement with morphine.  Blood pressures remain elevated.  Will add labetalol and dissection study.  Mental status clear.  No respiratory distress at rest.   [MP]    Clinical Course User Index [MP] Arby Barrette, MD    Procedures  MDM  Patient to be admitted for hypertensive urgency. Pain now controlled. Stable, no PE or dissection, Troponin x2 flat.      Arlyn Dunning, PA-C 12/14/18 2235    Margarita Grizzle, MD 12/22/18 1719

## 2018-12-15 ENCOUNTER — Encounter (HOSPITAL_COMMUNITY): Payer: Self-pay | Admitting: Cardiology

## 2018-12-15 DIAGNOSIS — R072 Precordial pain: Secondary | ICD-10-CM

## 2018-12-15 DIAGNOSIS — I160 Hypertensive urgency: Secondary | ICD-10-CM | POA: Diagnosis not present

## 2018-12-15 DIAGNOSIS — G4733 Obstructive sleep apnea (adult) (pediatric): Secondary | ICD-10-CM | POA: Diagnosis not present

## 2018-12-15 LAB — BASIC METABOLIC PANEL
Anion gap: 9 (ref 5–15)
BUN: 17 mg/dL (ref 6–20)
CALCIUM: 8.7 mg/dL — AB (ref 8.9–10.3)
CO2: 25 mmol/L (ref 22–32)
Chloride: 105 mmol/L (ref 98–111)
Creatinine, Ser: 1.03 mg/dL (ref 0.61–1.24)
GFR calc Af Amer: >60 mL/min (ref >60)
GFR calc non Af Amer: >60 mL/min (ref >60)
Glucose, Bld: 88 mg/dL (ref 70–99)
Potassium: 3.6 mmol/L (ref 3.5–5.1)
SODIUM: 139 mmol/L (ref 135–145)

## 2018-12-15 LAB — CBC
HCT: 41.4 % (ref 39.0–52.0)
Hemoglobin: 13.3 g/dL (ref 13.0–17.0)
MCH: 28.9 pg (ref 26.0–34.0)
MCHC: 32.1 g/dL (ref 30.0–36.0)
MCV: 90 fL (ref 80.0–100.0)
Platelets: 316 10*3/uL (ref 150–400)
RBC: 4.6 MIL/uL (ref 4.22–5.81)
RDW: 13.1 % (ref 11.5–15.5)
WBC: 8 10*3/uL (ref 4.0–10.5)
nRBC: 0 % (ref 0.0–0.2)

## 2018-12-15 LAB — TROPONIN I
Troponin I: 0.04 ng/mL (ref <0.03)
Troponin I: 0.04 ng/mL (ref <0.03)

## 2018-12-15 LAB — HIV ANTIBODY (ROUTINE TESTING W REFLEX): HIV SCREEN 4TH GENERATION: NONREACTIVE

## 2018-12-15 MED ORDER — IRBESARTAN 300 MG PO TABS: 300.0000 mg | ORAL_TABLET | Freq: Every day | ORAL | Status: DC

## 2018-12-15 NOTE — Discharge Summary (Signed)
Physician Discharge Summary  Richard Hale PYP:[id number] DOB: 04/07/1986 DOA: 12/14/2018  PCP: Cain Saupe, MD  Admit date: 12/14/2018 Discharge date: 12/15/2018  Admitted From: Observation Disposition: home  Recommendations for Outpatient Follow-up:  1. Follow up with PCP in 1-2 weeks 2. Please follow up on the following pending results:none  Home Health:No Equipment/Devices:none  Discharge Condition:Stable CODE STATUS:Full code Diet recommendation: Cardiac diet  Brief/Interim Summary: Per admitting provider: [name] is a 33 y.o. male with medical history significant of HTN, OSA.  Patient presents to the ED with c/o 3 day h/o cough, has developed severe R sided chest / flank pain with coughing.  Pain worse with deep breath.  No fever that he is aware of, cough is non-productive.  No leg swelling.  2 days ago he ran out of his BP meds.   ED Course: Initial BP is 200/128! Trop neg x2.  BP controlled with resumption of home meds + clonidine + labetalol IV.  Hospital course: Hypertensive urgency.  Patient's home medications were reinitiated which include Norvasc hydrochlorothiazide and irbeSartin.  His blood pressure normalized.  Upon further discussion with the patient he reported he actually has his home medications but did not take them because he was afraid of mixing them with an account other medications he was taking.  This was reviewed with him and his comfortable with his medication regimen. Cough.  Patient was treated symptomatically with Tessalon Perles.  He is not on an ACE inhibitor but he is on oral.  We will continue this on discharge for now, he will have close follow-up with his PCP for further evaluation of discontinuation of arm if coughing persists and using alternative medication. Obstructive sleep apnea.  Patient is on CPAP at home which he will continue without change.  Discharge Diagnoses:  Principal Problem:   Hypertensive urgency Active  Problems:   Obstructive sleep apnea    Discharge Instructions  Discharge Instructions    Call MD for:   Complete by:  As directed    Any acute change in medical condition   Diet - low sodium heart healthy   Complete by:  As directed    Increase activity slowly   Complete by:  As directed      Allergies as of 12/15/2018      Reactions   Lisinopril Cough      Medication List    TAKE these medications   amLODipine 10 MG tablet Commonly known as:  NORVASC Take 1 tablet (10 mg total) by mouth daily.   aspirin EC 325 MG tablet Take 325 mg by mouth every 6 (six) hours as needed for moderate pain.   hydrochlorothiazide 25 MG tablet Commonly known as:  HYDRODIURIL Take 1 tablet (25 mg total) by mouth daily. Take on tablet in the morning.   irbesartan 300 MG tablet Commonly known as:  AVAPRO Take 1 tablet (300 mg total) by mouth daily.   traZODone 50 MG tablet Commonly known as:  DESYREL Take 0.5-1 tablets (25-50 mg total) by mouth at bedtime as needed for sleep.       Allergies  Allergen Reactions  . Lisinopril Cough    Consultations:  None   Procedures/Studies: Dg Chest 2 View  Result Date: 12/14/2018 CLINICAL DATA:  Right axillary pain. Mild right anterior chest pain. Shortness of breath. Dry cough for the past 2 days. Smoker. EXAM: CHEST - 2 VIEW COMPARISON:  04/23/2017. FINDINGS: Normal sized heart. Clear lungs. Stable mild diffuse peribronchial thickening. Normal appearing  bones. No visible fracture or pneumothorax. IMPRESSION: No acute disease. Stable mild chronic bronchitic changes. Electronically Signed   By: Beckie Salts M.D.   On: 12/14/2018 18:11   Ct Angio Chest/abd/pel For Dissection W And/or W/wo  Result Date: 12/14/2018 CLINICAL DATA:  Increased right-sided pain with cough x3 days. Chest and back pain. Evaluate for aortic dissection. EXAM: CT ANGIOGRAPHY CHEST, ABDOMEN AND PELVIS TECHNIQUE: Multidetector CT imaging through the chest, abdomen and  pelvis was performed using the standard protocol during bolus administration of intravenous contrast. Multiplanar reconstructed images and MIPs were obtained and reviewed to evaluate the vascular anatomy. CONTRAST:  ISOVUE-370 IOPAMIDOL (ISOVUE-370) INJECTION 76% COMPARISON:  None. FINDINGS: CTA CHEST FINDINGS Cardiovascular: The unenhanced images through the chest demonstrate no mural hematoma associated with the thoracic aorta. After IV contrast administration, conventional branch pattern of the great vessels is identified without dissection. No aortic aneurysm or dissection. Cardiac motion artifacts are noted along the proximal ascending thoracic aorta similar in appearance to the adjacent pulmonary artery and therefore felt to be artifactual. No significant calcific coronary arteriosclerosis. Heart size is top normal without pericardial effusion. No large central pulmonary embolus is noted though the study is not tailored toward assessment of pulmonary emboli. Mediastinum/Nodes: No enlarged mediastinal, hilar, or axillary lymph nodes. Thyroid gland, trachea, and esophagus demonstrate no significant findings. Lungs/Pleura: Passive atelectasis at the lung bases. No effusion or pneumothorax. No dominant mass. Musculoskeletal: No chest wall abnormality. No acute or significant osseous findings. Review of the MIP images confirms the above findings. CTA ABDOMEN AND PELVIS FINDINGS VASCULAR Aorta: Normal caliber aorta without aneurysm, dissection, vasculitis or significant stenosis. Celiac: Patent without evidence of aneurysm, dissection, vasculitis or significant stenosis. SMA: Patent without evidence of aneurysm, dissection, vasculitis or significant stenosis. Renals: Single left renal artery and dual renal arteries to the right kidney are noted without significant stenosis, fibromuscular dysplasia, aneurysm or dissection. IMA: Patent without evidence of aneurysm, dissection, vasculitis or significant stenosis.  Inflow: Patent without evidence of aneurysm, dissection, vasculitis or significant stenosis. Veins: No obvious venous abnormality within the limitations of this arterial phase study. Review of the MIP images confirms the above findings. NON-VASCULAR Hepatobiliary: No focal liver abnormality is seen. No gallstones, gallbladder wall thickening, or biliary dilatation. Pancreas: Unremarkable. No pancreatic ductal dilatation or surrounding inflammatory changes. Spleen: Normal in size without focal abnormality. Adrenals/Urinary Tract: Adrenal glands are unremarkable. Kidneys are normal, without renal calculi, focal lesion, or hydronephrosis. Bladder is unremarkable. Stomach/Bowel: Stomach is within normal limits. Appendix appears normal. No evidence of bowel wall thickening, distention, or inflammatory changes. Scattered colonic diverticulosis is noted, more so along the sigmoid colon without acute diverticulitis. Lymphatic: No enlarged abdominal or pelvic lymph nodes. Reproductive: Prostate is unremarkable. Other: Tiny fat containing periumbilical hernia. No abdominopelvic ascites. Musculoskeletal: No acute or significant osseous findings. Slight disc space narrowing at L5-S1. Review of the MIP images confirms the above findings. IMPRESSION: 1. Nonaneurysmal thoracic and abdominal aorta without evidence of dissection. 2. No large central pulmonary embolus. 3. No active pulmonary disease. 4. No acute solid nor hollow visceral organ pathology. 5. Minimal sigmoid diverticulosis without acute diverticulitis. Electronically Signed   By: Tollie Eth M.D.   On: 12/14/2018 19:54       Subjective: Patient reports he feels much better.  Blood pressure well controlled, reduced coughing.  Stable for discharge.  Discharge Exam: Vitals:   12/15/18 0522 12/15/18 0924  BP: (!) 141/83 (!) 154/105  Pulse: 62   Resp: 18  Temp: 97.7 F (36.5 C)   SpO2: 97%    Vitals:   12/15/18 0015 12/15/18 0021 12/15/18 0522 12/15/18  0924  BP:  (!) 162/102 (!) 141/83 (!) 154/105  Pulse:  (!) 57 62   Resp:   18   Temp:  (!) 97.4 F (36.3 C) 97.7 F (36.5 C)   TempSrc:  Oral Oral   SpO2:  98% 97%   Weight: (!) 173.8 kg     Height: 6\' 2"  (1.88 m)       General: Pt is alert, awake, not in acute distress Cardiovascular: RRR, S1/S2 +, no rubs, no gallops Respiratory: CTA bilaterally, no wheezing, no rhonchi Abdominal: Soft, NT, ND, bowel sounds + Extremities: no edema, no cyanosis    The results of significant diagnostics from this hospitalization (including imaging, microbiology, ancillary and laboratory) are listed below for reference.     Microbiology: No results found for this or any previous visit (from the past 240 hour(s)).   Labs: BNP (last 3 results) Recent Labs    12/14/18 1723  BNP 98.8   Basic Metabolic Panel: Recent Labs  Lab 12/14/18 1723 12/15/18 0508  NA 138 139  K 3.5 3.6  CL 106 105  CO2 27 25  GLUCOSE 107* 88  BUN 15 17  CREATININE 1.19 1.03  CALCIUM 8.8* 8.7*   Liver Function Tests: Recent Labs  Lab 12/14/18 1723  AST 31  ALT 21  ALKPHOS 93  BILITOT 0.4  PROT 7.4  ALBUMIN 3.7   Recent Labs  Lab 12/14/18 1723  LIPASE 31   No results for input(s): AMMONIA in the last 168 hours. CBC: Recent Labs  Lab 12/14/18 1717 12/15/18 0508  WBC 8.4 8.0  HGB 14.7 13.3  HCT 44.8 41.4  MCV 89.1 90.0  PLT 405* 316   Cardiac Enzymes: Recent Labs  Lab 12/15/18 0027 12/15/18 0508  TROPONINI 0.04* 0.04*   BNP: Invalid input(s): POCBNP CBG: No results for input(s): GLUCAP in the last 168 hours. D-Dimer Recent Labs    12/14/18 1717  DDIMER 0.32   Hgb A1c No results for input(s): HGBA1C in the last 72 hours. Lipid Profile No results for input(s): CHOL, HDL, LDLCALC, TRIG, CHOLHDL, LDLDIRECT in the last 72 hours. Thyroid function studies No results for input(s): TSH, T4TOTAL, T3FREE, THYROIDAB in the last 72 hours.  Invalid input(s): FREET3 Anemia work up No  results for input(s): VITAMINB12, FOLATE, FERRITIN, TIBC, IRON, RETICCTPCT in the last 72 hours. Urinalysis    Component Value Date/Time   COLORURINE YELLOW 12/14/2018 1823   APPEARANCEUR CLEAR 12/14/2018 1823   LABSPEC 1.019 12/14/2018 1823   PHURINE 6.0 12/14/2018 1823   GLUCOSEU NEGATIVE 12/14/2018 1823   HGBUR NEGATIVE 12/14/2018 1823   BILIRUBINUR NEGATIVE 12/14/2018 1823   BILIRUBINUR negative 10/21/2018 0949   KETONESUR NEGATIVE 12/14/2018 1823   PROTEINUR 30 (A) 12/14/2018 1823   UROBILINOGEN 0.2 10/21/2018 0949   NITRITE NEGATIVE 12/14/2018 1823   LEUKOCYTESUR NEGATIVE 12/14/2018 1823   Sepsis Labs Invalid input(s): PROCALCITONIN,  WBC,  LACTICIDVEN Microbiology No results found for this or any previous visit (from the past 240 hour(s)).   Time coordinating discharge: Over 30 minutes  SIGNED:   Burke Keels, MD  [facility] 12/15/2018, 10:17 AM Pager   If 7PM-7AM, please contact night-coverage [email or web address] Password [email or web address]

## 2018-12-15 NOTE — Progress Notes (Signed)
Troponin resulted at 0.04.  RN into check on patient and patient appeared to be sleeping.  RN text paged [facility] with result.

## 2018-12-15 NOTE — Consult Note (Signed)
Cardiology Consultation:   Patient ID: Richard Hale MRN: [id number]; DOB: 01-Feb-1986  Admit date: 12/14/2018 Date of Consult: 12/15/2018  Primary Care Provider: Cain Saupe, MD Primary Cardiologist:    Patient Profile:   Richard Hale is a 33 y.o. male with a hx of obstructive sleep apnea, hypertension, morbid obesity who is being seen today for the evaluation of hypertension and chest pain at the request of Lyda Perone DO.  History of Present Illness:   Renal Dopplers August 2018 limited but no obvious stenosis.  Patient admitted with chest pain and cardiology asked to evaluate.  Patient states he had a URI over the past week with congestion and dry cough.  He then developed pain in the right lateral chest wall that increased with cough and inspiration.  He presented for further evaluation.  He was noted to be hypertensive as well.  Cardiology asked to evaluate.  Note he typically does not have dyspnea on exertion, orthopnea, PND, pedal edema, exertional chest pain or syncope.  Past Medical History:  Diagnosis Date  . Hypertension   . Morbid obesity (HCC)   . Obstructive sleep apnea     Past Surgical History:  Procedure Laterality Date  . NOSE SURGERY       Inpatient Medications: Scheduled Meds: . amLODipine  10 mg Oral Daily  . enoxaparin (LOVENOX) injection  80 mg Subcutaneous Daily  . hydrochlorothiazide  25 mg Oral Daily  . irbesartan  300 mg Oral Daily   Continuous Infusions:  PRN Meds: acetaminophen **OR** acetaminophen, benzonatate, labetalol, ondansetron **OR** ondansetron (ZOFRAN) IV, traZODone  Allergies:    Allergies  Allergen Reactions  . Lisinopril Cough    Social History:   Social History   Socioeconomic History  . Marital status: Single    Spouse name: Not on file  . Number of children: Not on file  . Years of education: Not on file  . Highest education level: Not on file  Occupational History  . Not on file  Social Needs  .  Financial resource strain: Not on file  . Food insecurity:    Worry: Not on file    Inability: Not on file  . Transportation needs:    Medical: Not on file    Non-medical: Not on file  Tobacco Use  . Smoking status: Current Every Day Smoker    Packs/day: 0.50    Types: Cigarettes  . Smokeless tobacco: Never Used  Substance and Sexual Activity  . Alcohol use: Yes    Comment: occ  . Drug use: No  . Sexual activity: Yes  Lifestyle  . Physical activity:    Days per week: Not on file    Minutes per session: Not on file  . Stress: Not on file  Relationships  . Social connections:    Talks on phone: Not on file    Gets together: Not on file    Attends religious service: Not on file    Active member of club or organization: Not on file    Attends meetings of clubs or organizations: Not on file    Relationship status: Not on file  . Intimate partner violence:    Fear of current or ex partner: Not on file    Emotionally abused: Not on file    Physically abused: Not on file    Forced sexual activity: Not on file  Other Topics Concern  . Not on file  Social History Narrative  . Not on file  Family History:    Family History  Problem Relation Age of Onset  . Hypertension Mother   . Diabetes Maternal Grandmother   . CAD Father      ROS:  Please see the history of present illness.  Recent URI symptoms including congestion and nonproductive cough. All other ROS reviewed and negative.     Physical Exam/Data:   Vitals:   12/15/18 0015 12/15/18 0021 12/15/18 0522 12/15/18 0924  BP:  (!) 162/102 (!) 141/83 (!) 154/105  Pulse:  (!) 57 62   Resp:   18   Temp:  (!) 97.4 F (36.3 C) 97.7 F (36.5 C)   TempSrc:  Oral Oral   SpO2:  98% 97%   Weight: (!) 173.8 kg     Height: 6\' 2"  (1.88 m)      No intake or output data in the 24 hours ending 12/15/18 1036 Last 3 Weights 12/15/2018 12/14/2018 10/21/2018  Weight (lbs) 383 lb 3.2 oz 352 lb 385 lb  Weight (kg) 173.818 kg 159.666  kg 174.635 kg     Body mass index is 49.2 kg/m.  General:  Well nourished, obese in no acute distress HEENT: normal Lymph: no adenopathy Neck: no JVD Endocrine:  No thryomegaly Vascular: No carotid bruits; FA pulses 2+ bilaterally without bruits  Cardiac:  normal S1, S2; RRR; no murmur  Lungs:  clear to auscultation bilaterally, no wheezing, rhonchi or rales  Abd: soft, nontender, no hepatomegaly  Ext: no edema Musculoskeletal:  No deformities, BUE and BLE strength normal and equal Skin: warm and dry  Neuro:  CNs 2-12 intact, no focal abnormalities noted Psych:  Normal affect   EKG:  The EKG was personally reviewed and demonstrates: Normal sinus rhythm, left ventricular hypertrophy with repolarization abnormality. Telemetry:  Telemetry was personally reviewed and demonstrates: Normal sinus rhythm  Laboratory Data:  Chemistry Recent Labs  Lab 12/14/18 1723 12/15/18 0508  NA 138 139  K 3.5 3.6  CL 106 105  CO2 27 25  GLUCOSE 107* 88  BUN 15 17  CREATININE 1.19 1.03  CALCIUM 8.8* 8.7*  GFRNONAA >60 >60  GFRAA >60 >60  ANIONGAP 5 9    Recent Labs  Lab 12/14/18 1723  PROT 7.4  ALBUMIN 3.7  AST 31  ALT 21  ALKPHOS 93  BILITOT 0.4   Hematology Recent Labs  Lab 12/14/18 1717 12/15/18 0508  WBC 8.4 8.0  RBC 5.03 4.60  HGB 14.7 13.3  HCT 44.8 41.4  MCV 89.1 90.0  MCH 29.2 28.9  MCHC 32.8 32.1  RDW 13.1 13.1  PLT 405* 316   Cardiac Enzymes Recent Labs  Lab 12/15/18 0027 12/15/18 0508  TROPONINI 0.04* 0.04*    Recent Labs  Lab 12/14/18 1731 12/14/18 2051  TROPIPOC 0.08 0.07    BNP Recent Labs  Lab 12/14/18 1723  BNP 98.8    DDimer  Recent Labs  Lab 12/14/18 1717  DDIMER 0.32    Radiology/Studies:  Dg Chest 2 View  Result Date: 12/14/2018 CLINICAL DATA:  Right axillary pain. Mild right anterior chest pain. Shortness of breath. Dry cough for the past 2 days. Smoker. EXAM: CHEST - 2 VIEW COMPARISON:  04/23/2017. FINDINGS: Normal sized  heart. Clear lungs. Stable mild diffuse peribronchial thickening. Normal appearing bones. No visible fracture or pneumothorax. IMPRESSION: No acute disease. Stable mild chronic bronchitic changes. Electronically Signed   By: Beckie Salts M.D.   On: 12/14/2018 18:11   Ct Angio Chest/abd/pel For Dissection W And/or W/wo  Result Date: 12/14/2018 CLINICAL DATA:  Increased right-sided pain with cough x3 days. Chest and back pain. Evaluate for aortic dissection. EXAM: CT ANGIOGRAPHY CHEST, ABDOMEN AND PELVIS TECHNIQUE: Multidetector CT imaging through the chest, abdomen and pelvis was performed using the standard protocol during bolus administration of intravenous contrast. Multiplanar reconstructed images and MIPs were obtained and reviewed to evaluate the vascular anatomy. CONTRAST:  ISOVUE-370 IOPAMIDOL (ISOVUE-370) INJECTION 76% COMPARISON:  None. FINDINGS: CTA CHEST FINDINGS Cardiovascular: The unenhanced images through the chest demonstrate no mural hematoma associated with the thoracic aorta. After IV contrast administration, conventional branch pattern of the great vessels is identified without dissection. No aortic aneurysm or dissection. Cardiac motion artifacts are noted along the proximal ascending thoracic aorta similar in appearance to the adjacent pulmonary artery and therefore felt to be artifactual. No significant calcific coronary arteriosclerosis. Heart size is top normal without pericardial effusion. No large central pulmonary embolus is noted though the study is not tailored toward assessment of pulmonary emboli. Mediastinum/Nodes: No enlarged mediastinal, hilar, or axillary lymph nodes. Thyroid gland, trachea, and esophagus demonstrate no significant findings. Lungs/Pleura: Passive atelectasis at the lung bases. No effusion or pneumothorax. No dominant mass. Musculoskeletal: No chest wall abnormality. No acute or significant osseous findings. Review of the MIP images confirms the above  findings. CTA ABDOMEN AND PELVIS FINDINGS VASCULAR Aorta: Normal caliber aorta without aneurysm, dissection, vasculitis or significant stenosis. Celiac: Patent without evidence of aneurysm, dissection, vasculitis or significant stenosis. SMA: Patent without evidence of aneurysm, dissection, vasculitis or significant stenosis. Renals: Single left renal artery and dual renal arteries to the right kidney are noted without significant stenosis, fibromuscular dysplasia, aneurysm or dissection. IMA: Patent without evidence of aneurysm, dissection, vasculitis or significant stenosis. Inflow: Patent without evidence of aneurysm, dissection, vasculitis or significant stenosis. Veins: No obvious venous abnormality within the limitations of this arterial phase study. Review of the MIP images confirms the above findings. NON-VASCULAR Hepatobiliary: No focal liver abnormality is seen. No gallstones, gallbladder wall thickening, or biliary dilatation. Pancreas: Unremarkable. No pancreatic ductal dilatation or surrounding inflammatory changes. Spleen: Normal in size without focal abnormality. Adrenals/Urinary Tract: Adrenal glands are unremarkable. Kidneys are normal, without renal calculi, focal lesion, or hydronephrosis. Bladder is unremarkable. Stomach/Bowel: Stomach is within normal limits. Appendix appears normal. No evidence of bowel wall thickening, distention, or inflammatory changes. Scattered colonic diverticulosis is noted, more so along the sigmoid colon without acute diverticulitis. Lymphatic: No enlarged abdominal or pelvic lymph nodes. Reproductive: Prostate is unremarkable. Other: Tiny fat containing periumbilical hernia. No abdominopelvic ascites. Musculoskeletal: No acute or significant osseous findings. Slight disc space narrowing at L5-S1. Review of the MIP images confirms the above findings. IMPRESSION: 1. Nonaneurysmal thoracic and abdominal aorta without evidence of dissection. 2. No large central pulmonary  embolus. 3. No active pulmonary disease. 4. No acute solid nor hollow visceral organ pathology. 5. Minimal sigmoid diverticulosis without acute diverticulitis. Electronically Signed   By: Tollie Eth M.D.   On: 12/14/2018 19:54    Assessment and Plan:   1. Chest pain-symptoms are pleuritic and related to recent URI.  Troponins are borderline but not consistent with acute coronary syndrome.  Would not pursue further ischemia evaluation.  Note CT showed no dissection and no pulmonary embolus. 2. Hypertension-patient's blood pressure is elevated.  He was not taking his blood pressure medications at time of admission.  Amlodipine, hydrochlorothiazide and Avapro have been resumed.  Would follow blood pressure and if needed could add labetalol.  I discussed the importance  of blood pressure control with patient.  I discussed secondary effects including increased risk of CVA, congestive heart failure and renal failure with uncontrolled hypertension. 3. Tobacco abuse-patient counseled on discontinuing. 4. Obesity-patient needs weight loss.  For questions or updates, please contact [facility] Please consult [email or web address] for contact info under     Signed, Olga Millers, MD  12/15/2018 10:36 AM

## 2018-12-16 ENCOUNTER — Telehealth: Payer: Self-pay | Admitting: *Deleted

## 2018-12-16 NOTE — Telephone Encounter (Signed)
-----   Message from Cain Saupe, MD sent at 12/15/2018  4:48 PM EST ----- HIV test is negative/non-reactive

## 2018-12-16 NOTE — Telephone Encounter (Signed)
Medical Assistant left message on patient's home and cell voicemail. Voicemail states to give a call back to Hally Colella with [facility] at [phone]. Patient is aware of HIV being negative.  

## 2019-01-27 ENCOUNTER — Ambulatory Visit: Payer: BLUE CROSS/BLUE SHIELD | Admitting: Family Medicine

## 2019-02-06 ENCOUNTER — Ambulatory Visit: Payer: BLUE CROSS/BLUE SHIELD | Attending: Family Medicine | Admitting: Family Medicine

## 2019-02-06 ENCOUNTER — Other Ambulatory Visit: Payer: Self-pay

## 2019-02-06 DIAGNOSIS — Z5321 Procedure and treatment not carried out due to patient leaving prior to being seen by health care provider: Secondary | ICD-10-CM

## 2019-04-06 ENCOUNTER — Other Ambulatory Visit: Payer: Self-pay

## 2019-04-06 ENCOUNTER — Observation Stay (HOSPITAL_COMMUNITY)
Admission: EM | Admit: 2019-04-06 | Discharge: 2019-04-07 | Disposition: A | Discharge status: Home / Self Care | Payer: BC Managed Care – PPO | Attending: Internal Medicine | Admitting: Internal Medicine

## 2019-04-06 ENCOUNTER — Encounter (HOSPITAL_COMMUNITY): Payer: Self-pay | Admitting: Internal Medicine

## 2019-04-06 ENCOUNTER — Encounter: Payer: Self-pay | Admitting: Family Medicine

## 2019-04-06 ENCOUNTER — Emergency Department (HOSPITAL_COMMUNITY): Payer: BC Managed Care – PPO

## 2019-04-06 ENCOUNTER — Ambulatory Visit (HOSPITAL_BASED_OUTPATIENT_CLINIC_OR_DEPARTMENT_OTHER): Payer: BC Managed Care – PPO | Admitting: Family Medicine

## 2019-04-06 VITALS — BP 171/120 | HR 96 | Temp 98.5°F | Ht 74.0 in | Wt 383.0 lb

## 2019-04-06 DIAGNOSIS — Z720 Tobacco use: Secondary | ICD-10-CM | POA: Diagnosis present

## 2019-04-06 DIAGNOSIS — R7303 Prediabetes: Secondary | ICD-10-CM | POA: Diagnosis not present

## 2019-04-06 DIAGNOSIS — R079 Chest pain, unspecified: Secondary | ICD-10-CM | POA: Diagnosis not present

## 2019-04-06 DIAGNOSIS — R7989 Other specified abnormal findings of blood chemistry: Secondary | ICD-10-CM

## 2019-04-06 DIAGNOSIS — R319 Hematuria, unspecified: Secondary | ICD-10-CM

## 2019-04-06 DIAGNOSIS — G4733 Obstructive sleep apnea (adult) (pediatric): Secondary | ICD-10-CM | POA: Diagnosis not present

## 2019-04-06 DIAGNOSIS — I160 Hypertensive urgency: Secondary | ICD-10-CM | POA: Diagnosis not present

## 2019-04-06 DIAGNOSIS — I10 Essential (primary) hypertension: Secondary | ICD-10-CM | POA: Diagnosis present

## 2019-04-06 DIAGNOSIS — Z6841 Body Mass Index (BMI) 40.0 and over, adult: Secondary | ICD-10-CM | POA: Insufficient documentation

## 2019-04-06 DIAGNOSIS — I110 Hypertensive heart disease with heart failure: Secondary | ICD-10-CM | POA: Insufficient documentation

## 2019-04-06 DIAGNOSIS — F17210 Nicotine dependence, cigarettes, uncomplicated: Secondary | ICD-10-CM | POA: Diagnosis not present

## 2019-04-06 DIAGNOSIS — R9431 Abnormal electrocardiogram [ECG] [EKG]: Secondary | ICD-10-CM | POA: Diagnosis not present

## 2019-04-06 DIAGNOSIS — Z20828 Contact with and (suspected) exposure to other viral communicable diseases: Secondary | ICD-10-CM | POA: Insufficient documentation

## 2019-04-06 DIAGNOSIS — R350 Frequency of micturition: Secondary | ICD-10-CM | POA: Diagnosis not present

## 2019-04-06 DIAGNOSIS — Z79899 Other long term (current) drug therapy: Secondary | ICD-10-CM | POA: Diagnosis not present

## 2019-04-06 DIAGNOSIS — I5040 Unspecified combined systolic (congestive) and diastolic (congestive) heart failure: Secondary | ICD-10-CM | POA: Diagnosis not present

## 2019-04-06 DIAGNOSIS — E876 Hypokalemia: Secondary | ICD-10-CM | POA: Diagnosis present

## 2019-04-06 DIAGNOSIS — R778 Other specified abnormalities of plasma proteins: Secondary | ICD-10-CM

## 2019-04-06 DIAGNOSIS — Z8249 Family history of ischemic heart disease and other diseases of the circulatory system: Secondary | ICD-10-CM | POA: Diagnosis not present

## 2019-04-06 DIAGNOSIS — G4489 Other headache syndrome: Secondary | ICD-10-CM

## 2019-04-06 DIAGNOSIS — I249 Acute ischemic heart disease, unspecified: Principal | ICD-10-CM | POA: Diagnosis present

## 2019-04-06 DIAGNOSIS — R0789 Other chest pain: Secondary | ICD-10-CM | POA: Diagnosis not present

## 2019-04-06 HISTORY — DX: Tobacco use: Z72.0

## 2019-04-06 LAB — CBC
HCT: 43.2 % (ref 39.0–52.0)
Hemoglobin: 14.3 g/dL (ref 13.0–17.0)
MCH: 29.7 pg (ref 26.0–34.0)
MCHC: 33.1 g/dL (ref 30.0–36.0)
MCV: 89.8 fL (ref 80.0–100.0)
Platelets: 366 10*3/uL (ref 150–400)
RBC: 4.81 MIL/uL (ref 4.22–5.81)
RDW: 13.9 % (ref 11.5–15.5)
WBC: 8.1 10*3/uL (ref 4.0–10.5)
nRBC: 0 % (ref 0.0–0.2)

## 2019-04-06 LAB — BASIC METABOLIC PANEL
Anion gap: 10 (ref 5–15)
BUN: 11 mg/dL (ref 6–20)
CO2: 27 mmol/L (ref 22–32)
Calcium: 8.9 mg/dL (ref 8.9–10.3)
Chloride: 102 mmol/L (ref 98–111)
Creatinine, Ser: 1.21 mg/dL (ref 0.61–1.24)
GFR calc Af Amer: >60 mL/min (ref >60)
GFR calc non Af Amer: >60 mL/min (ref >60)
Glucose, Bld: 102 mg/dL — ABNORMAL HIGH (ref 70–99)
Potassium: 3.1 mmol/L — ABNORMAL LOW (ref 3.5–5.1)
Sodium: 139 mmol/L (ref 135–145)

## 2019-04-06 LAB — SARS CORONAVIRUS 2 BY RT PCR (HOSPITAL ORDER, PERFORMED IN ~~LOC~~ HOSPITAL LAB): SARS Coronavirus 2: NEGATIVE

## 2019-04-06 LAB — POCT URINALYSIS DIP (CLINITEK)
Bilirubin, UA: NEGATIVE
Glucose, UA: NEGATIVE mg/dL
Ketones, POC UA: NEGATIVE mg/dL
Leukocytes, UA: NEGATIVE
Nitrite, UA: NEGATIVE
POC PROTEIN,UA: NEGATIVE
Spec Grav, UA: 1.015
Urobilinogen, UA: 0.2 U/dL
pH, UA: 6.5

## 2019-04-06 LAB — MAGNESIUM: Magnesium: 2.1 mg/dL (ref 1.7–2.4)

## 2019-04-06 LAB — TROPONIN I (HIGH SENSITIVITY)
Troponin I (High Sensitivity): 35 ng/L — ABNORMAL HIGH (ref <18)
Troponin I (High Sensitivity): 35 ng/L — ABNORMAL HIGH (ref <18)

## 2019-04-06 MED ORDER — HYDROCHLOROTHIAZIDE 25 MG PO TABS
25.0000 mg | ORAL_TABLET | Freq: Every day | ORAL | Status: DC
  Administered 2019-04-07: 25 mg via ORAL
  Filled 2019-04-06: qty 1

## 2019-04-06 MED ORDER — LABETALOL HCL 200 MG PO TABS
300.0000 mg | ORAL_TABLET | Freq: Two times a day (BID) | ORAL | Status: DC
  Administered 2019-04-06 – 2019-04-07 (×2): 300 mg via ORAL
  Filled 2019-04-06 (×2): qty 1

## 2019-04-06 MED ORDER — ASPIRIN 81 MG PO CHEW
324.0000 mg | CHEWABLE_TABLET | Freq: Once | ORAL | Status: AC
  Administered 2019-04-06: 324 mg via ORAL
  Filled 2019-04-06: qty 4

## 2019-04-06 MED ORDER — POTASSIUM CHLORIDE CRYS ER 20 MEQ PO TBCR
40.0000 meq | EXTENDED_RELEASE_TABLET | Freq: Once | ORAL | Status: AC
  Administered 2019-04-06: 40 meq via ORAL
  Filled 2019-04-06: qty 2

## 2019-04-06 MED ORDER — ACETAMINOPHEN 650 MG RE SUPP: 650.0000 mg | Freq: Four times a day (QID) | RECTAL | Status: DC | PRN

## 2019-04-06 MED ORDER — MAGNESIUM SULFATE 2 GM/50ML IV SOLN
2.0000 g | Freq: Once | INTRAVENOUS | Status: AC
  Administered 2019-04-07: 2 g via INTRAVENOUS
  Filled 2019-04-06: qty 50

## 2019-04-06 MED ORDER — ACETAMINOPHEN 500 MG PO TABS: 1000.0000 mg | ORAL_TABLET | Freq: Four times a day (QID) | ORAL | Status: DC | PRN

## 2019-04-06 MED ORDER — NICOTINE 21 MG/24HR TD PT24: 21.0000 mg | MEDICATED_PATCH | Freq: Every day | TRANSDERMAL | Status: DC | PRN

## 2019-04-06 MED ORDER — POTASSIUM CHLORIDE CRYS ER 20 MEQ PO TBCR: 40.0000 meq | EXTENDED_RELEASE_TABLET | Freq: Once | ORAL | Status: DC

## 2019-04-06 MED ORDER — TRAZODONE HCL 50 MG PO TABS: 25.0000 mg | ORAL_TABLET | Freq: Every evening | ORAL | Status: DC | PRN

## 2019-04-06 MED ORDER — ENOXAPARIN SODIUM 40 MG/0.4ML ~~LOC~~ SOLN: 40.0000 mg | SUBCUTANEOUS | Status: DC

## 2019-04-06 MED ORDER — ASPIRIN EC 81 MG PO TBEC
162.0000 mg | DELAYED_RELEASE_TABLET | Freq: Every day | ORAL | Status: DC
  Administered 2019-04-07: 162 mg via ORAL
  Filled 2019-04-06: qty 2

## 2019-04-06 MED ORDER — ENOXAPARIN SODIUM 100 MG/ML ~~LOC~~ SOLN
85.0000 mg | SUBCUTANEOUS | Status: DC
  Administered 2019-04-06: 85 mg via SUBCUTANEOUS
  Filled 2019-04-06: qty 1

## 2019-04-06 MED ORDER — IRBESARTAN 150 MG PO TABS
300.0000 mg | ORAL_TABLET | Freq: Every day | ORAL | Status: DC
  Administered 2019-04-07: 300 mg via ORAL
  Filled 2019-04-06: qty 2

## 2019-04-06 MED ORDER — POTASSIUM CHLORIDE CRYS ER 20 MEQ PO TBCR
40.0000 meq | EXTENDED_RELEASE_TABLET | Freq: Two times a day (BID) | ORAL | Status: DC
  Administered 2019-04-07: 40 meq via ORAL
  Filled 2019-04-06: qty 2

## 2019-04-06 MED ORDER — PROCHLORPERAZINE EDISYLATE 10 MG/2ML IJ SOLN: 10.0000 mg | Freq: Four times a day (QID) | INTRAMUSCULAR | Status: DC | PRN

## 2019-04-06 MED ORDER — AMLODIPINE BESYLATE 10 MG PO TABS
10.0000 mg | ORAL_TABLET | Freq: Every day | ORAL | Status: DC
  Administered 2019-04-07: 10 mg via ORAL
  Filled 2019-04-06: qty 1

## 2019-04-06 MED ORDER — MORPHINE SULFATE (PF) 4 MG/ML IV SOLN: 6.0000 mg | INTRAVENOUS | Status: DC | PRN

## 2019-04-06 MED ORDER — NITROGLYCERIN 0.4 MG SL SUBL: 0.4000 mg | SUBLINGUAL_TABLET | SUBLINGUAL | Status: DC | PRN

## 2019-04-06 MED ORDER — OXYCODONE HCL 5 MG PO TABS
5.0000 mg | ORAL_TABLET | Freq: Once | ORAL | Status: AC
  Administered 2019-04-06: 5 mg via ORAL
  Filled 2019-04-06: qty 1

## 2019-04-06 NOTE — Progress Notes (Signed)
Pharmacy: Lovenox Dose Adjustment for VTE prophylaxis  OBJECTIVE:  Wt: 172.4   Ht: 6'3"  BMI~47.5 SCr 1.21  CrCl~80-100 ml/min  ASSESSMENT:  89 YOM on lovenox for VTE prophylaxis requiring a dose adjustment for BMI>30 and CrCl>30 ml/min  PLAN:  1. Adjust Lovenox to 85 mg (~0.5 mg/kg) SQ every 24 hours 2. Pharmacy will monitor peripherally for s/sx of bleeding and any necessary dose adjustments   Alycia Rossetti, PharmD, BCPS 9:23 PM

## 2019-04-06 NOTE — ED Triage Notes (Signed)
Pt here from PCP office, sent here for abnormal ekg. Pt endorses L sided stabbing pain in chest and headache. Sts chest pain has been intermittent x a few months but worsened today. Denies shob, N/V.

## 2019-04-06 NOTE — H&P (Signed)
History and Physical    Richard PaceDemetrius K Hale ZOX:[id number] DOB: [birth year] DOA: 04/06/2019  PCP: Richard SaupeFulp, Cammie, MD   Patient coming from: PCPs office.  I have personally briefly reviewed patient's old medical records in [facility]  Chief Complaint: CP and abnormal EKG.  HPI: Richard Hale is a 33 y.o. male with medical history significant of hypertension, morbid obesity, obstructive sleep apnea, tobacco use, glucose intolerance who is coming from his PCPs office due to left-sided stabbing chest pain and headache.  Patient states that he has been having intermittent chest pain for the past few months, however today the pain was the most intense since the episodes begun.  It is described left-sided, sharp or stabbing, non-radiated, associated with mild dyspnea, improved with deep inspiration and he denies palpitations, diaphoresis, dizziness, nausea or emesis.  He does not report orthopnea, PND or recent pitting edema of the lower extremities.  He denies abdominal pain, constipation, diarrhea, melena or hematochezia.  No dysuria, frequency or hematuria.  He denies blurred vision, polyphagia, polydipsia or polyuria.  ED Course: Initial vital signs temperature 99.2 F, pulse 95, respirations 16, blood pressure 163/109 mmHg and O2 sat 98% on room air.  Patient was given aspirin and supplemental oxygen in the emergency department.  Serial EKGs being sinus arrhythmia at once, twice with sinus rhythm and normal EKGs have shown QRS widening and electrocardiographic evidence of LVH.  High-sensitivity troponin I has been 35 ng/L x 2. His CBC was normal.  BMP shows a potassium of 3.1 mmol/L and a nonfasting glucose of 102 mg/dL.  All other values are within normal limits.  His chest radiograph did not show any active cardiopulmonary pathology.  Review of Systems: As per HPI otherwise 10 point review of systems negative.   Past Medical History:  Diagnosis Date  . Hypertension   . Morbid obesity  (HCC)   . Obstructive sleep apnea   . Tobacco use 04/06/2019    Past Surgical History:  Procedure Laterality Date  . NOSE SURGERY       reports that he has been smoking cigarettes. He has been smoking about 0.50 packs per day. He has never used smokeless tobacco. He reports current alcohol use. He reports that he does not use drugs.  Allergies  Allergen Reactions  . Lisinopril Cough    Family History  Problem Relation Age of Onset  . Hypertension Mother   . Diabetes Maternal Grandmother   . CAD Father    Prior to Admission medications   Medication Sig Start Date End Date Taking? Authorizing Provider  amLODipine (NORVASC) 10 MG tablet Take 1 tablet (10 mg total) by mouth daily. 10/21/18  Yes Fulp, Cammie, MD  hydrochlorothiazide (HYDRODIURIL) 25 MG tablet Take 1 tablet (25 mg total) by mouth daily. Take on tablet in the morning. 10/21/18  Yes Fulp, Cammie, MD  irbesartan (AVAPRO) 300 MG tablet Take 1 tablet (300 mg total) by mouth daily. 10/21/18  Yes Fulp, Cammie, MD  traZODone (DESYREL) 50 MG tablet Take 0.5-1 tablets (25-50 mg total) by mouth at bedtime as needed for sleep. 09/05/18  Yes Richard SaupeFulp, Cammie, MD    Physical Exam: Vitals:   04/06/19 1845 04/06/19 1930 04/06/19 1945 04/06/19 2103  BP: (!) 163/85 136/83 (!) 151/77 (!) 158/98  Pulse: 85 79 76 67  Resp: 19 (!) 21 20 20   Temp:    98 F (36.7 C)  TempSrc:    Oral  SpO2: 93% 92% 95% 96%  Weight:  Height:        Constitutional: NAD, calm, comfortable Eyes: PERRL, lids and conjunctivae normal ENMT: Mucous membranes are moist. Posterior pharynx clear of any exudate or lesions. Neck: normal, supple, no masses, no thyromegaly Respiratory: Decreased breath sounds on bases, otherwise clear to auscultation bilaterally, no wheezing, no crackles. Normal respiratory effort. No accessory muscle use.  Cardiovascular: Regular rate and rhythm, no murmurs / rubs / gallops. No extremity edema. 2+ pedal pulses. No carotid bruits.   Abdomen: Obese, soft, no tenderness, no masses palpated. No hepatosplenomegaly. Bowel sounds positive.  Musculoskeletal: no clubbing / cyanosis.  Good ROM, no contractures. Normal muscle tone.  Skin: no significant rashes, lesions, ulcers on limited dermatological examination Neurologic: CN 2-12 grossly intact. Sensation intact, DTR normal. Strength 5/5 in all 4.  Psychiatric: Normal judgment and insight. Alert and oriented x 3. Normal mood.   Labs on Admission: I have personally reviewed following labs and imaging studies  CBC: Recent Labs  Lab 04/06/19 1616  WBC 8.1  HGB 14.3  HCT 43.2  MCV 89.8  PLT 626   Basic Metabolic Panel: Recent Labs  Lab 04/06/19 1616  NA 139  K 3.1*  CL 102  CO2 27  GLUCOSE 102*  BUN 11  CREATININE 1.21  CALCIUM 8.9   GFR: Estimated Creatinine Clearance: 148.4 mL/min (by C-G formula based on SCr of 1.21 mg/dL). Liver Function Tests: No results for input(s): AST, ALT, ALKPHOS, BILITOT, PROT, ALBUMIN in the last 168 hours. No results for input(s): LIPASE, AMYLASE in the last 168 hours. No results for input(s): AMMONIA in the last 168 hours. Coagulation Profile: No results for input(s): INR, PROTIME in the last 168 hours. Cardiac Enzymes: No results for input(s): CKTOTAL, CKMB, CKMBINDEX, TROPONINI in the last 168 hours. BNP (last 3 results) No results for input(s): PROBNP in the last 8760 hours. HbA1C: No results for input(s): HGBA1C in the last 72 hours. CBG: No results for input(s): GLUCAP in the last 168 hours. Lipid Profile: No results for input(s): CHOL, HDL, LDLCALC, TRIG, CHOLHDL, LDLDIRECT in the last 72 hours. Thyroid Function Tests: No results for input(s): TSH, T4TOTAL, FREET4, T3FREE, THYROIDAB in the last 72 hours. Anemia Panel: No results for input(s): VITAMINB12, FOLATE, FERRITIN, TIBC, IRON, RETICCTPCT in the last 72 hours. Urine analysis:    Component Value Date/Time   COLORURINE YELLOW 12/14/2018 1823   APPEARANCEUR  CLEAR 12/14/2018 1823   LABSPEC 1.019 12/14/2018 1823   PHURINE 6.0 12/14/2018 1823   GLUCOSEU NEGATIVE 12/14/2018 1823   HGBUR NEGATIVE 12/14/2018 1823   BILIRUBINUR negative 04/06/2019 1717   KETONESUR negative 04/06/2019 [place] 12/14/2018 1823   PROTEINUR 30 (A) 12/14/2018 1823   UROBILINOGEN 0.2 04/06/2019 1717   NITRITE Negative 04/06/2019 1717   NITRITE NEGATIVE 12/14/2018 1823   LEUKOCYTESUR Negative 04/06/2019 1717   LEUKOCYTESUR NEGATIVE 12/14/2018 1823    Radiological Exams on Admission: Dg Chest 2 View  Result Date: 04/06/2019 CLINICAL DATA:  Chest pain EXAM: CHEST - 2 VIEW COMPARISON:  12/14/2018 FINDINGS: The heart size and mediastinal contours are within normal limits. Both lungs are clear. The visualized skeletal structures are unremarkable. IMPRESSION: No active cardiopulmonary disease. Electronically Signed   By: Donavan Foil M.D.   On: 04/06/2019 17:11    EKG: Independently reviewed. EKG #1 04/06/19 15:27 Vent. rate 89 BPM PR interval 188 ms QRS duration 126 ms QT/QTc 374/455 ms P-R-T axes 48 -17 142 Normal sinus rhythm with sinus arrhythmia Left ventricular hypertrophy with QRS  widening and repolarization abnormality Abnormal ECG  EKG #2 04/06/19 16:07 Vent. rate 97 BPM PR interval 168 ms QRS duration 128 ms QT/QTc 382/485 ms P-R-T axes 51 -23 81 Normal sinus rhythm Left ventricular hypertrophy with QRS widening and repolarization abnormality Cannot rule out Septal infarct , age undetermined Abnormal ECG  EKG #3 04/06/19 21:51 Vent. rate 69 BPM PR interval 190 ms QRS duration 126 ms QT/QTc 422/452 ms P-R-T axes 44 -17 182 Normal sinus rhythm Left ventricular hypertrophy with QRS widening and repolarization abnormality Abnormal ECG  Assessment/Plan Principal Problem:   Acute coronary syndrome (HCC) Observation/progressive unit. Supplemental oxygen. Serial troponin measurement. Serial EKG tracings. Check fasting lipids.  Smoking cessation advised. Weight loss and overall lifestyle modifications encouraged. Sublingual nitroglycerin as needed. Morphine 6 mg IVP every 2 hours as needed. Check echocardiogram in the a.m. Consult cardiology overnight for increased troponin/EKG changes. Consult cardiology in a.m.  as this patient will probably need further work-up.  Active Problems:   Hypokalemia Potassium supplementation given. Magnesium was also supplemented given hypokalemia/QRS widening.    Hypertension Continue amlodipine 10 mg p.o. daily. Continue Avapro 300 mg p.o. daily. Continue hydrochlorothiazide 25 mg p.o. daily. Add Potassium supplementation to regimen Check magnesium level.    Obstructive sleep apnea Not on CPAP.    Morbid obesity (HCC) Advised to engage in aggressive lifestyle modifications.    Prediabetes Last A1c was 5.8% last year. Carbohydrate modified diet. Check hemoglobin A1c in a.m. CBG monitoring before meals and bedtime.    Tobacco use Nicotine replacement therapy ordered as needed. Staff to provide tobacco cessation information.     DVT prophylaxis: Lovenox SQ. Code Status: Full code. Family Communication:  Disposition Plan: Observation for serial troponin/serial EKG/echo in a.m. Consults called: Admission status: Observation/progressive unit.   Bobette Mo MD [facility]  04/06/2019, 9:38 PM   This document was prepared using Dragon voice recognition software and may contain some unintended transcription errors.

## 2019-04-06 NOTE — Progress Notes (Signed)
Chest pains and headaches for a while

## 2019-04-06 NOTE — ED Notes (Signed)
ED TO INPATIENT HANDOFF REPORT  ED Nurse Name and Phone #: Marcelina MorelShawnette [phone]  S Name/Age/Gender Richard Hale 33 y.o. male Room/Bed: 037C/037C  Code Status   Code Status: Prior  Home/SNF/Other Home Patient oriented to: self, place, time and situation Is this baseline? Yes   Triage Complete: Triage complete  Chief Complaint Abn Ekg  Triage Note Pt here from PCP office, sent here for abnormal ekg. Pt endorses L sided stabbing pain in chest and headache. Sts chest pain has been intermittent x a few months but worsened today. Denies shob, N/V.    Allergies Allergies  Allergen Reactions  . Lisinopril Cough    Level of Care/Admitting Diagnosis ED Disposition    ED Disposition Condition Comment   Admit  [facility] [[id number]]  Level of Care: Progressive [102]  I expect the patient will be discharged within 24 hours: No (not a candidate for 5C-Observation unit)  Covid Evaluation: Screening Protocol (No Symptoms)  Diagnosis: Acute coronary syndrome [facility](HCC) [454098][[id number]]  Admitting Physician: Bobette MoTIZ, DAVID MANUEL [[id number]]  Attending Physician: Bobette MoTIZ, DAVID MANUEL [[id number]]  PT Class (Do Not Modify): Observation [104]  PT Acc Code (Do Not Modify): Observation [10022]       B Medical/Surgery History Past Medical History:  Diagnosis Date  . Hypertension   . Morbid obesity (HCC)   . Obstructive sleep apnea    Past Surgical History:  Procedure Laterality Date  . NOSE SURGERY       A IV Location/Drains/Wounds Patient Lines/Drains/Airways Status   Active Line/Drains/Airways    None          Intake/Output Last 24 hours No intake or output data in the 24 hours ending 04/06/19 2034  Labs/Imaging Results for orders placed or performed during the hospital encounter of 04/06/19 (from the past 48 hour(s))  Basic metabolic panel     Status: Abnormal   Collection Time: 04/06/19  4:16 PM  Result Value Ref Range   Sodium 139 135 - 145  mmol/L   Potassium 3.1 (L) 3.5 - 5.1 mmol/L    Comment: HEMOLYSIS AT THIS LEVEL MAY AFFECT RESULT   Chloride 102 98 - 111 mmol/L   CO2 27 22 - 32 mmol/L   Glucose, Bld 102 (H) 70 - 99 mg/dL   BUN 11 6 - 20 mg/dL   Creatinine, Ser 0.861.21 0.61 - 1.24 mg/dL   Calcium 8.9 8.9 - 57.810.3 mg/dL   GFR calc non Af Amer >60 >60 mL/min   GFR calc Af Amer >60 >60 mL/min   Anion gap 10 5 - 15    Comment: Performed at [facility], [address] [place], [place] [address]  CBC     Status: None   Collection Time: 04/06/19  4:16 PM  Result Value Ref Range   WBC 8.1 4.0 - 10.5 K/uL   RBC 4.81 4.22 - 5.81 MIL/uL   Hemoglobin 14.3 13.0 - 17.0 g/dL   HCT 95.243.2 84.139.0 - 32.452.0 %   MCV 89.8 80.0 - 100.0 fL   MCH 29.7 26.0 - 34.0 pg   MCHC 33.1 30.0 - 36.0 g/dL   RDW 40.113.9 02.711.5 - 25.315.5 %   Platelets 366 150 - 400 K/uL   nRBC 0.0 0.0 - 0.2 %    Comment: Performed at [facility], [address] [place], [place] [address]  Troponin I (High Sensitivity)     Status: Abnormal   Collection Time: 04/06/19  4:16 PM  Result  Value Ref Range   Troponin I (High Sensitivity) 35 (H) <18 ng/L    Comment: (NOTE) Elevated high sensitivity troponin I (hsTnI) values and significant  changes across serial measurements may suggest ACS but many other  chronic and acute conditions are known to elevate hsTnI results.  Refer to the Links section for chest pain algorithms and additional  guidance. Performed at [facility], [place] [address], [place], [place] [address]   Troponin I (High Sensitivity)     Status: Abnormal   Collection Time: 04/06/19  6:30 PM  Result Value Ref Range   Troponin I (High Sensitivity) 35 (H) <18 ng/L    Comment: (NOTE) Elevated high sensitivity troponin I (hsTnI) values and significant  changes across serial measurements may suggest ACS but many other  chronic and acute conditions are known to elevate hsTnI results.  Refer to the "Links" section for chest pain algorithms and  additional  guidance. Performed at [facility], [place] [address], [place], [place] [address]   SARS Coronavirus 2 (CEPHEID - Performed in [place] hospital lab), Hosp Order     Status: None   Collection Time: 04/06/19  6:41 PM   Specimen: Nasopharyngeal Swab  Result Value Ref Range   SARS Coronavirus 2 NEGATIVE NEGATIVE    Comment: (NOTE) If result is NEGATIVE SARS-CoV-2 target nucleic acids are NOT DETECTED. The SARS-CoV-2 RNA is generally detectable in upper and lower  respiratory specimens during the acute phase of infection. The lowest  concentration of SARS-CoV-2 viral copies this assay can detect is 250  copies / mL. A negative result does not preclude SARS-CoV-2 infection  and should not be used as the sole basis for treatment or other  patient management decisions.  A negative result may occur with  improper specimen collection / handling, submission of specimen other  than nasopharyngeal swab, presence of viral mutation(s) within the  areas targeted by this assay, and inadequate number of viral copies  (<250 copies / mL). A negative result must be combined with clinical  observations, patient history, and epidemiological information. If result is POSITIVE SARS-CoV-2 target nucleic acids are DETECTED. The SARS-CoV-2 RNA is generally detectable in upper and lower  respiratory specimens dur ing the acute phase of infection.  Positive  results are indicative of active infection with SARS-CoV-2.  Clinical  correlation with patient history and other diagnostic information is  necessary to determine patient infection status.  Positive results do  not rule out bacterial infection or co-infection with other viruses. If result is PRESUMPTIVE POSTIVE SARS-CoV-2 nucleic acids MAY BE PRESENT.   A presumptive positive result was obtained on the submitted specimen  and confirmed on repeat testing.  While 2019 novel coronavirus  (SARS-CoV-2) nucleic acids may be present in the  submitted sample  additional confirmatory testing may be necessary for epidemiological  and / or clinical management purposes  to differentiate between  SARS-CoV-2 and other Sarbecovirus currently known to infect humans.  If clinically indicated additional testing with an alternate test  methodology [phone]) is advised. The SARS-CoV-2 RNA is generally  detectable in upper and lower respiratory sp ecimens during the acute  phase of infection. The expected result is Negative. Fact Sheet for Patients:  StrictlyIdeas.no Fact Sheet for Healthcare Providers: BankingDealers.co.za This test is not yet approved or cleared by the [place] FDA and has been authorized for detection and/or diagnosis of SARS-CoV-2 by FDA under an Emergency Use Authorization (EUA).  This EUA will remain in  effect (meaning this test can be used) for the duration of the COVID-19 declaration under Section 564(b)(1) of the Act, 21 U.S.C. section 360bbb-3(b)(1), unless the authorization is terminated or revoked sooner. Performed at [facility], [address], [place], [place] [address]    Dg Chest 2 View  Result Date: 04/06/2019 CLINICAL DATA:  Chest pain EXAM: CHEST - 2 VIEW COMPARISON:  12/14/2018 FINDINGS: The heart size and mediastinal contours are within normal limits. Both lungs are clear. The visualized skeletal structures are unremarkable. IMPRESSION: No active cardiopulmonary disease. Electronically Signed   By: Jasmine Pang M.D.   On: 04/06/2019 17:11    Pending Labs Unresulted Labs (From admission, onward)    Start     Ordered   04/06/19 2030  Magnesium  Add-on,   AD     04/06/19 2029          Vitals/Pain Today's Vitals   04/06/19 1845 04/06/19 1857 04/06/19 1930 04/06/19 1945  BP: (!) 163/85  136/83 (!) 151/77  Pulse: 85  79 76  Resp: 19  (!) 21 20  Temp:      TempSrc:      SpO2: 93%  92% 95%  Weight:      Height:      PainSc:  2        Isolation Precautions No active isolations  Medications Medications  potassium chloride SA (K-DUR) CR tablet 40 mEq (has no administration in time range)  aspirin chewable tablet 324 mg (324 mg Oral Given 04/06/19 1752)  oxyCODONE (Oxy IR/ROXICODONE) immediate release tablet 5 mg (5 mg Oral Given 04/06/19 1753)    Mobility walks Low fall risk   Focused Assessments Chest Pain   R Recommendations: See Admitting Provider Note  Report given to:   Additional Notes:

## 2019-04-06 NOTE — ED Provider Notes (Signed)
Care assumed from Dr. Gerlene Fee at shift change, please see their notes for full documentation of patient's complaint/HPI. Briefly, pt here with several weeks of intermittent chest pain as well as concerning EKG, sent from PCP's office. Results so far show high sens trop 35 and mild hypokalemia at 3.1. Awaiting repeat high sens trop. Plan is to admit.   Physical Exam  BP (!) 163/85   Pulse 85   Temp 99.2 F (37.3 C) (Oral)   Resp 19   Ht 6\' 3"  (1.905 m)   Wt (!) 172.4 kg   SpO2 93%   BMI 47.50 kg/m   Physical Exam Vitals signs and nursing note reviewed.  Constitutional:      Appearance: He is not ill-appearing.  HENT:     Head: Normocephalic and atraumatic.  Eyes:     Conjunctiva/sclera: Conjunctivae normal.  Cardiovascular:     Rate and Rhythm: Normal rate and regular rhythm.  Pulmonary:     Effort: Pulmonary effort is normal.     Breath sounds: Normal breath sounds.  Skin:    General: Skin is warm and dry.     Coloration: Skin is not jaundiced.  Neurological:     Mental Status: He is alert.     ED Course/Procedures     Procedures  Repeat trop 35 as well; no change. Will consult hosptalist for admission at this time.   Hospitalist Dr. Olevia Bowens will admit to step down unit for cardiac workup.   MDM Number of Diagnoses or Management Options Chest pain, unspecified type:  Elevated troponin:          Eustaquio Maize, PA-C 04/06/19 2125    Maudie Flakes, MD 04/12/19 [phone]

## 2019-04-06 NOTE — ED Provider Notes (Signed)
[name] Emergency Department Provider Note MRN:  [id number]  Arrival date & time: 04/06/19     Chief Complaint   Abnormal ECG and Chest Pain   History of Present Illness   Richard Hale is a 33 y.o. year-old male with a history of hypertension, obesity presenting to the ED with chief complaint of chest pain.  Several weeks of intermittent chest pain.  Pain is located in the center of the chest, nonradiating.  Described as a tightness or pressure.  Seems random, not necessarily exertional.  Denies dizziness, no syncope, no nausea, no vomiting, occasional diaphoresis, no shortness of breath.  Denies abdominal pain, no numbness or weakness to the arms or legs, no leg pain or swelling.  Sent by PCP for abnormal EKG.  Review of Systems  A complete 10 system review of systems was obtained and all systems are negative except as noted in the HPI and PMH.   Patient's Health History    Past Medical History:  Diagnosis Date  . Hypertension   . Morbid obesity (HCC)   . Obstructive sleep apnea     Past Surgical History:  Procedure Laterality Date  . NOSE SURGERY      Family History  Problem Relation Age of Onset  . Hypertension Mother   . Diabetes Maternal Grandmother   . CAD Father     Social History   Socioeconomic History  . Marital status: Single    Spouse name: Not on file  . Number of children: Not on file  . Years of education: Not on file  . Highest education level: Not on file  Occupational History  . Not on file  Social Needs  . Financial resource strain: Not on file  . Food insecurity    Worry: Not on file    Inability: Not on file  . Transportation needs    Medical: Not on file    Non-medical: Not on file  Tobacco Use  . Smoking status: Current Every Day Smoker    Packs/day: 0.50    Types: Cigarettes  . Smokeless tobacco: Never Used  Substance and Sexual Activity  . Alcohol use: Yes    Comment: occ  . Drug use: No  . Sexual  activity: Yes  Lifestyle  . Physical activity    Days per week: Not on file    Minutes per session: Not on file  . Stress: Not on file  Relationships  . Social Musician on phone: Not on file    Gets together: Not on file    Attends religious service: Not on file    Active member of club or organization: Not on file    Attends meetings of clubs or organizations: Not on file    Relationship status: Not on file  . Intimate partner violence    Fear of current or ex partner: Not on file    Emotionally abused: Not on file    Physically abused: Not on file    Forced sexual activity: Not on file  Other Topics Concern  . Not on file  Social History Narrative  . Not on file     Physical Exam  Vital Signs and Nursing Notes reviewed Vitals:   04/06/19 1830 04/06/19 1845  BP: (!) 148/86 (!) 163/85  Pulse: 82 85  Resp:  19  Temp:    SpO2: 96% 93%    CONSTITUTIONAL: Well-appearing, NAD NEURO:  Alert and oriented x 3, no focal deficits  EYES:  eyes equal and reactive ENT/NECK:  no LAD, no JVD CARDIO: Regular rate, well-perfused, normal S1 and S2 PULM:  CTAB no wheezing or rhonchi GI/GU:  normal bowel sounds, non-distended, non-tender MSK/SPINE:  No gross deformities, no edema SKIN:  no rash, atraumatic PSYCH:  Appropriate speech and behavior  Diagnostic and Interventional Summary    EKG Interpretation  Date/Time:  Thursday April 06 2019 16:07:38 EDT Ventricular Rate:  97 PR Interval:  168 QRS Duration: 128 QT Interval:  382 QTC Calculation: 485 R Axis:   -23 Text Interpretation:  Normal sinus rhythm Left ventricular hypertrophy with QRS widening and repolarization abnormality Cannot rule out Septal infarct, question of type III Brugada, age undetermined Abnormal ECG Confirmed by Gerlene Fee [phone]) on 04/06/2019 5:39:06 PM      Labs Reviewed  BASIC METABOLIC PANEL - Abnormal; Notable for the following components:      Result Value   Potassium 3.1 (*)     Glucose, Bld 102 (*)    All other components within normal limits  TROPONIN I (HIGH SENSITIVITY) - Abnormal; Notable for the following components:   Troponin I (High Sensitivity) 35 (*)    All other components within normal limits  SARS CORONAVIRUS 2 (HOSPITAL ORDER, [facility])  CBC  TROPONIN I (HIGH SENSITIVITY)    DG Chest 2 View  Final Result      Medications  aspirin chewable tablet 324 mg (324 mg Oral Given 04/06/19 1752)  oxyCODONE (Oxy IR/ROXICODONE) immediate release tablet 5 mg (5 mg Oral Given 04/06/19 1753)     Procedures Critical Care  ED Course and Medical Decision Making  I have reviewed the triage vital signs and the nursing notes.  Pertinent labs & imaging results that were available during my care of the patient were reviewed by me and considered in my medical decision making (see below for details).  Chest pain in this 33 year old male with history of hypertension, obesity, tobacco abuse, positive family history for CAD, father died in his 21s.  Heart score is 4.  First troponin is 35, awaiting second troponin.  Anticipating observation admission to hospitalist service.  Patient's EKG is also concerning for possible Brugada type III, will consult cardiology.  Discussed case with cardiology, who is not concern for Brugada, EKG more consistent with LVH.  Cardiology is in agreement with hospitalist admission so long as second troponin is not significantly rising.  Signed out to oncoming provider at shift change.  Barth Kirks. Sedonia Small, Brent [email or web address]  Final Clinical Impressions(s) / ED Diagnoses     ICD-10-CM   1. Chest pain, unspecified type  R07.9   2. Elevated troponin  R79.89     ED Discharge Orders    None         Maudie Flakes, MD 04/06/19 1911

## 2019-04-06 NOTE — Progress Notes (Signed)
Established Patient Office Visit  Subjective:  Patient ID: Richard Hale, male    DOB: January 20, 1986  Age: 33 y.o. MRN: [id number]  CC:  Chief Complaint  Patient presents with  . Chest Pain  . Headache    HPI Richard Hale, 33 year old male, presents in follow-up of hypertension for which he reports compliance with medications, as well as complaint of chronic, recurrent headaches which tend to last for about 3 days at a time and patient also with complaint of recurrent chest pain for months.  Patient reports earlier today while he was using a plunger to un-stop a toilet at home, he had onset of left-sided chest pain.  He reports the chest pain was sharp and lasted for more than 5 minutes and he also had sweating.  He denies nausea.  He did not feel that he had any radiation of pain, discomfort or abnormal sensation into the neck, throat or jaw.  He denies any radiation of pain to either shoulder or upper back with his chest pain.  He reports that he has had chest pain off and on that can occur in the left or right chest and his chest pain can last sometimes for up to an hour or more.  He also has had recurrent, pounding headaches which started the forehead area and then spread to the rest of the head.  He tends to have headaches that last about 3 days at a time and are accompanied by noise, light sensitivity.  He does not recall a prior diagnosis of migraines.  He reports that he is taking all of his blood pressure medications on a daily basis.  He has not checked his blood pressure anywhere recently but when he does check his blood pressure it tends to remain elevated.  Patient reports that when he is having headaches, he also has pressure and pain in his posterior neck and across the upper shoulders bilaterally.  He denies any current shortness of breath, no swelling in the legs.  Past Medical History:  Diagnosis Date  . Hypertension   . Morbid obesity (HCC)   . Obstructive sleep apnea       Past Surgical History:  Procedure Laterality Date  . NOSE SURGERY      Family History  Problem Relation Age of Onset  . Hypertension Mother   . Diabetes Maternal Grandmother   . CAD Father     Social History   Socioeconomic History  . Marital status: Single    Spouse name: Not on file  . Number of children: Not on file  . Years of education: Not on file  . Highest education level: Not on file  Occupational History  . Not on file  Social Needs  . Financial resource strain: Not on file  . Food insecurity    Worry: Not on file    Inability: Not on file  . Transportation needs    Medical: Not on file    Non-medical: Not on file  Tobacco Use  . Smoking status: Current Every Day Smoker    Packs/day: 0.50    Types: Cigarettes  . Smokeless tobacco: Never Used  Substance and Sexual Activity  . Alcohol use: Yes    Comment: occ  . Drug use: No  . Sexual activity: Yes  Lifestyle  . Physical activity    Days per week: Not on file    Minutes per session: Not on file  . Stress: Not on file  Relationships  .  Social Herbalist on phone: Not on file    Gets together: Not on file    Attends religious service: Not on file    Active member of club or organization: Not on file    Attends meetings of clubs or organizations: Not on file    Relationship status: Not on file  . Intimate partner violence    Fear of current or ex partner: Not on file    Emotionally abused: Not on file    Physically abused: Not on file    Forced sexual activity: Not on file  Other Topics Concern  . Not on file  Social History Narrative  . Not on file    Outpatient Medications Prior to Visit  Medication Sig Dispense Refill  . amLODipine (NORVASC) 10 MG tablet Take 1 tablet (10 mg total) by mouth daily. 90 tablet 1  . hydrochlorothiazide (HYDRODIURIL) 25 MG tablet Take 1 tablet (25 mg total) by mouth daily. Take on tablet in the morning. 90 tablet 1  . irbesartan (AVAPRO) 300 MG  tablet Take 1 tablet (300 mg total) by mouth daily. 90 tablet 1  . traZODone (DESYREL) 50 MG tablet Take 0.5-1 tablets (25-50 mg total) by mouth at bedtime as needed for sleep. 30 tablet 3  . aspirin EC 325 MG tablet Take 325 mg by mouth every 6 (six) hours as needed for moderate pain.     No facility-administered medications prior to visit.     Allergies  Allergen Reactions  . Lisinopril Cough    ROS Review of Systems  Constitutional: Positive for diaphoresis (occurs with chest pain) and fatigue. Negative for chills and fever.  HENT: Negative for sore throat and trouble swallowing.   Eyes: Positive for photophobia (with headaches). Negative for visual disturbance.  Respiratory: Negative for cough and shortness of breath.   Cardiovascular: Positive for chest pain. Negative for palpitations and leg swelling.  Gastrointestinal: Negative for abdominal pain, constipation, diarrhea and nausea.  Endocrine: Positive for polyuria. Negative for polydipsia and polyphagia.  Genitourinary: Positive for flank pain and frequency.  Musculoskeletal: Positive for back pain (right mid back). Negative for gait problem.  Neurological: Positive for headaches. Negative for dizziness and light-headedness.  Hematological: Negative for adenopathy. Does not bruise/bleed easily.      Objective:    Physical Exam  Constitutional: He is oriented to person, place, and time. He appears well-developed and well-nourished.  Morbidly obese male in NAD sitting on exam table wearing face mask (as per clinic protocol due to COVID-19 pandemic)  Eyes: Conjunctivae and EOM are normal.  Neck: Normal range of motion. No JVD present. No thyromegaly present.  Cardiovascular: Normal rate and regular rhythm.  Heart sounds are loud, pounding  Pulmonary/Chest: Effort normal and breath sounds normal.  Abdominal: Soft. There is abdominal tenderness (suprapubic discomfort to palp). There is no rebound.  Musculoskeletal:         General: Tenderness (right CVA tenderness on exam) present. No edema.  Lymphadenopathy:    He has no cervical adenopathy.  Neurological: He is alert and oriented to person, place, and time. No cranial nerve deficit.  Skin: Skin is warm.  Skin on arms/legs/exposed areas of body are normal/dry but patient with a light sheen on sweat on his forehead  Psychiatric: He has a normal mood and affect. His behavior is normal. Judgment and thought content normal.  Nursing note and vitals reviewed.   BP (!) 188/126 (BP Location: Right Arm, Patient Position: Sitting, Cuff  Size: Large)   Pulse 96   Temp 98.5 F (36.9 C) (Oral)   Ht 6\' 2"  (1.88 m)   Wt (!) 383 lb (173.7 kg)   SpO2 96%   BMI 49.17 kg/m  Wt Readings from Last 3 Encounters:  04/06/19 (!) 383 lb (173.7 kg)  12/15/18 (!) 383 lb 3.2 oz (173.8 kg)  10/21/18 (!) 385 lb (174.6 kg)      Lab Results  Component Value Date   TSH 0.795 04/23/2017   Lab Results  Component Value Date   WBC 8.0 12/15/2018   HGB 13.3 12/15/2018   HCT 41.4 12/15/2018   MCV 90.0 12/15/2018   PLT 316 12/15/2018   Lab Results  Component Value Date   NA 139 12/15/2018   K 3.6 12/15/2018   CO2 25 12/15/2018   GLUCOSE 88 12/15/2018   BUN 17 12/15/2018   CREATININE 1.03 12/15/2018   BILITOT 0.4 12/14/2018   ALKPHOS 93 12/14/2018   AST 31 12/14/2018   ALT 21 12/14/2018   PROT 7.4 12/14/2018   ALBUMIN 3.7 12/14/2018   CALCIUM 8.7 (L) 12/15/2018   ANIONGAP 9 12/15/2018   Lab Results  Component Value Date   CHOL 136 08/08/2018   Lab Results  Component Value Date   HDL 49 08/08/2018   Lab Results  Component Value Date   LDLCALC 65 08/08/2018   Lab Results  Component Value Date   TRIG 112 08/08/2018   Lab Results  Component Value Date   CHOLHDL 2.8 08/08/2018   Lab Results  Component Value Date   HGBA1C 5.8 (H) 08/08/2018      Assessment & Plan:   1. Hypertensive urgency Patient with recurrent hypertensive urgency episodes  despite his reported compliance with his current medications.  Blood pressure today with initial vital signs was 188/126 and with recheck, blood pressure 171/120 at end of visit.  Patient has been advised to go to the emergency department for further evaluation as he also has had no episodes of chest pain and abnormal EKG at today's visit.  He is also status post hypertensive urgency evaluation with hospitalization on 12/14/2018 through 12/15/2018.  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  He has not skipped any medication recently. - EKG 12-Lead - Ambulatory referral to Cardiology  2. Chest pain, unspecified type Patient with recurrent episodes of chest pain including episode of chest pain earlier today which was left-sided and lasted about an hour while patient was involved in a strenuous activity.  Patient has been asked to go to the emergency department for further evaluation of chest pain and hypertensive urgency.  EKG done at today's visit was abnormal.  Patient will also be referred to cardiology for follow-up after ED evaluation regarding his continued difficulty with uncontrolled hypertension, chest pain and headaches. - EKG 12-Lead - Ambulatory referral to Cardiology  3. Abnormal EKG Patient with complaint of recent recurrent chest pain including episode earlier today but not currently.  EKG with left ventricular hypertrophy with QRS widening and repolarization abnormality.  Patient has been asked to go to the emergency department for further evaluation  4. Other headache syndrome Patient with complaint of recurrent episodes of headaches which can last up to 3 days at a time and are accompanied with sensitivity to light and noise.  Patient also has some posterior neck discomfort as well as discomfort across the upper back  with his headaches.  Patient's headaches may be  partially due to his hypertension but I also suspect he may have a migraine type headache complex/syndrome.  Patient is being referred to neurology for further evaluation and treatment - Ambulatory referral to Neurology  5. Urinary frequency Patient with right flank pain, urinary frequency and urinalysis will be checked for infection.  Patient also has had hemoglobin A1c with mild increase of 5.8.  He will be notified if treatment is needed based on the results.  Unfortunately all the labs such as BMP would not be resulted until tomorrow and patient has been asked to go to the emergency department for further evaluation of his chest pain and uncontrolled hypertension/hypertensive urgency - POCT URINALYSIS DIP (CLINITEK)  An After Visit Summary was printed and given to the patient.  Follow-up: Return for Go to the Emergency department for further evaluation; f/u after ED in 1-2 weeks.   Cain Saupe, MD

## 2019-04-07 ENCOUNTER — Observation Stay (HOSPITAL_BASED_OUTPATIENT_CLINIC_OR_DEPARTMENT_OTHER): Payer: BC Managed Care – PPO

## 2019-04-07 DIAGNOSIS — I10 Essential (primary) hypertension: Secondary | ICD-10-CM | POA: Diagnosis not present

## 2019-04-07 DIAGNOSIS — R7989 Other specified abnormal findings of blood chemistry: Secondary | ICD-10-CM

## 2019-04-07 DIAGNOSIS — R079 Chest pain, unspecified: Secondary | ICD-10-CM

## 2019-04-07 DIAGNOSIS — I249 Acute ischemic heart disease, unspecified: Secondary | ICD-10-CM | POA: Diagnosis not present

## 2019-04-07 DIAGNOSIS — Z720 Tobacco use: Secondary | ICD-10-CM

## 2019-04-07 DIAGNOSIS — R7303 Prediabetes: Secondary | ICD-10-CM

## 2019-04-07 DIAGNOSIS — F17210 Nicotine dependence, cigarettes, uncomplicated: Secondary | ICD-10-CM | POA: Diagnosis not present

## 2019-04-07 DIAGNOSIS — E876 Hypokalemia: Secondary | ICD-10-CM | POA: Diagnosis not present

## 2019-04-07 DIAGNOSIS — I110 Hypertensive heart disease with heart failure: Secondary | ICD-10-CM | POA: Diagnosis not present

## 2019-04-07 DIAGNOSIS — G4733 Obstructive sleep apnea (adult) (pediatric): Secondary | ICD-10-CM

## 2019-04-07 LAB — BASIC METABOLIC PANEL
Anion gap: 9 (ref 5–15)
BUN: 18 mg/dL (ref 6–20)
CO2: 26 mmol/L (ref 22–32)
Calcium: 8.7 mg/dL — ABNORMAL LOW (ref 8.9–10.3)
Chloride: 104 mmol/L (ref 98–111)
Creatinine, Ser: 1.01 mg/dL (ref 0.61–1.24)
GFR calc Af Amer: >60 mL/min (ref >60)
GFR calc non Af Amer: >60 mL/min (ref >60)
Glucose, Bld: 99 mg/dL (ref 70–99)
Potassium: 3.5 mmol/L (ref 3.5–5.1)
Sodium: 139 mmol/L (ref 135–145)

## 2019-04-07 LAB — LIPID PANEL
Cholesterol: 132 mg/dL (ref 0–200)
HDL: 43 mg/dL (ref >40)
LDL Cholesterol: 60 mg/dL (ref 0–99)
Total CHOL/HDL Ratio: 3.1 RATIO
Triglycerides: 143 mg/dL (ref <150)
VLDL: 29 mg/dL (ref 0–40)

## 2019-04-07 LAB — HEMOGLOBIN A1C
Hgb A1c MFr Bld: 5.8 % — ABNORMAL HIGH (ref 4.8–5.6)
Mean Plasma Glucose: 119.76 mg/dL

## 2019-04-07 LAB — TROPONIN I (HIGH SENSITIVITY)
Troponin I (High Sensitivity): 37 ng/L — ABNORMAL HIGH (ref <18)
Troponin I (High Sensitivity): 40 ng/L — ABNORMAL HIGH (ref <18)

## 2019-04-07 LAB — ECHOCARDIOGRAM COMPLETE
Height: 75 in
Weight: 6080 oz

## 2019-04-07 LAB — MAGNESIUM: Magnesium: 2.4 mg/dL (ref 1.7–2.4)

## 2019-04-07 MED ORDER — LABETALOL HCL 100 MG PO TABS: 100.0000 mg | ORAL_TABLET | Freq: Two times a day (BID) | ORAL | 0 refills | Status: DC

## 2019-04-07 MED ORDER — PERFLUTREN LIPID MICROSPHERE
4.0000 mL | Freq: Once | INTRAVENOUS | Status: AC
  Administered 2019-04-07: 4 mL via INTRAVENOUS

## 2019-04-07 MED ORDER — PANTOPRAZOLE SODIUM 40 MG PO TBEC: 40.0000 mg | DELAYED_RELEASE_TABLET | Freq: Every day | ORAL | 0 refills | Status: DC

## 2019-04-07 MED ORDER — PANTOPRAZOLE SODIUM 40 MG PO TBEC
40.0000 mg | DELAYED_RELEASE_TABLET | Freq: Every day | ORAL | Status: DC
  Administered 2019-04-07: 40 mg via ORAL
  Filled 2019-04-07: qty 1

## 2019-04-07 NOTE — Progress Notes (Signed)
I discharged patient in stable condition to private vehicle. I reviewed d/c instructions and dietary modifications. Patient verbalized understanding. No further questions. Gave him a note for out of work while in hospital.

## 2019-04-07 NOTE — Discharge Summary (Signed)
Physician Discharge Summary  Richard PaceDemetrius K Fatula ZOX:[id number] DOB: 29-Aug-1986 DOA: 04/06/2019  PCP: Cain SaupeFulp, Cammie, MD  Admit date: 04/06/2019 Discharge date: 04/07/2019  Admitted From: home Disposition:  home   Recommendations for Outpatient Follow-up:  1. Needs improved control of BP 2. Needs weight loss 3. Continue to encourage smoking cessation 4. F/u on GERD  Discharge Condition:  stable   CODE STATUS:  Full code   Diet recommendation:  Heart healthy, low carb Consultations:  none    Discharge Diagnoses:  Principal Problem:   Acute coronary syndrome (HCC) Active Problems:   Hypertension   Obstructive sleep apnea   Morbid obesity (HCC)   Prediabetes   Tobacco use   Hypokalemia    HPI: Richard Hale is a 33 y.o. male with medical history significant of hypertension, morbid obesity, obstructive sleep apnea, tobacco use, glucose intolerance who is coming from his PCPs office due to left-sided stabbing chest pain and headache.  Patient states that he has been having intermittent chest pain for the past few months, however today the pain was the most intense since the episodes begun.  It is described left-sided, sharp or stabbing, non-radiated, associated with mild dyspnea, improved with deep inspiration and he denies palpitations, diaphoresis, dizziness, nausea or emesis.  He does not report orthopnea, PND or recent pitting edema of the lower extremities.  He denies abdominal pain, constipation, diarrhea, melena or hematochezia.  No dysuria, frequency or hematuria.  He denies blurred vision, polyphagia, polydipsia or polyuria.  Hospital Course:  Chest pain - Troponin are not consistent with ACS - EKG was non acute - no reproducible tenderness on exam - the patient tells me the pain was sharp, stabbing and intermittent - - pain has resolved and has not recurred after being admitted-   he has been having similar pain for almost 2 yrs now. - He also admits to severe GERD  symptoms multiple times a week - At this time, I feel his chest pain is atypical and he does not need further work up but he does have risk factors for CAD which need to be appropriately addressed. -  I have counseled him on better BP control, (including buying a BP monitor to check his BPs at home to keep a record), losing weight, stopping smoking, starting to use his CPAP machine an increased exercise.  He is in agreement.   Compenstated CHF, systolic and diastolic Uncontrolled HTn - 2 D ECHO was ordered to work up above chest pain - EF noted to be 45 % with LVH and pseudonormalization - cont ARB - have added Labetalol to better control his BP - have advised use of CPAP  GERD - he admits to frequent heartburn especially after certain types of foods - have education him on dietary changes need to resolve his symptoms and have added daily Protonix  OSA - has not been using his CPAP- have counseled him on the necessity of it  Morbid obesity Body mass index is 47.5 kg/m. - have discussed the importance of losing weight through diet and exercise  Nicotine abuse - have advised him to steadily stop smoking  Prediabetes - Hba1c 5.8- have discussed the need for a low carb diet and weight loss  - I did not start Metformin yet but this can be considered as outpt if A1c does not improve with lifestyle changes  Discharge Exam: Vitals:   04/07/19 0509 04/07/19 0842  BP: (!) 152/97 (!) 167/123  Pulse: 60   Resp: 20  Temp: 98.3 F (36.8 C)   SpO2: 95%    Vitals:   04/06/19 2215 04/07/19 0012 04/07/19 0509 04/07/19 0842  BP:  138/86 (!) 152/97 (!) 167/123  Pulse: 76 71 60   Resp:  18 20   Temp:  97.8 F (36.6 C) 98.3 F (36.8 C)   TempSrc:  Oral    SpO2:  95% 95%   Weight:      Height:        General: Pt is alert, awake, not in acute distress Cardiovascular: RRR, S1/S2 +, no rubs, no gallops Respiratory: CTA bilaterally, no wheezing, no rhonchi Abdominal: Soft, NT, ND,  bowel sounds + Extremities: no edema, no cyanosis   Discharge Instructions  Discharge Instructions    Diet - low sodium heart healthy   Complete by: As directed    Increase activity slowly   Complete by: As directed      Allergies as of 04/07/2019      Reactions   Lisinopril Cough      Medication List    TAKE these medications   amLODipine 10 MG tablet Commonly known as: NORVASC Take 1 tablet (10 mg total) by mouth daily.   hydrochlorothiazide 25 MG tablet Commonly known as: HYDRODIURIL Take 1 tablet (25 mg total) by mouth daily. Take on tablet in the morning.   irbesartan 300 MG tablet Commonly known as: AVAPRO Take 1 tablet (300 mg total) by mouth daily.   labetalol 100 MG tablet Commonly known as: NORMODYNE Take 1 tablet (100 mg total) by mouth 2 (two) times daily.   pantoprazole 40 MG tablet Commonly known as: PROTONIX Take 1 tablet (40 mg total) by mouth daily. Start taking on: April 08, 2019   traZODone 50 MG tablet Commonly known as: DESYREL Take 0.5-1 tablets (25-50 mg total) by mouth at bedtime as needed for sleep.      Follow-up Information    Fulp, Cammie, MD Follow up.   Specialty: Family Medicine Why: BP and Bmet Contact information: [address] [place] [address] [phone]          Allergies  Allergen Reactions  . Lisinopril Cough     Procedures/Studies: 2 D ECHO  1. The left ventricle has a visually estimated ejection fraction of 45%. The cavity size was mildly dilated. There is moderately increased left ventricular wall thickness. Left ventricular diastolic Doppler parameters are consistent with  pseudonormalization. Left ventrical global hypokinesis without regional wall motion abnormalities.  2. The right ventricle has normal systolic function. The cavity was normal. There is no increase in right ventricular wall thickness.  3. No evidence of mitral valve stenosis. No mitral regurgitation.  4. The aortic valve is  tricuspid. No stenosis of the aortic valve.  5. The aortic root is normal in size and structure. No complete TR doppler jet so unable to estimate PA systolic pressure.  Dg Chest 2 View  Result Date: 04/06/2019 CLINICAL DATA:  Chest pain EXAM: CHEST - 2 VIEW COMPARISON:  12/14/2018 FINDINGS: The heart size and mediastinal contours are within normal limits. Both lungs are clear. The visualized skeletal structures are unremarkable. IMPRESSION: No active cardiopulmonary disease. Electronically Signed   By: Jasmine Pang M.D.   On: 04/06/2019 17:11     The results of significant diagnostics from this hospitalization (including imaging, microbiology, ancillary and laboratory) are listed below for reference.     Microbiology: Recent Results (from the past 240 hour(s))  SARS Coronavirus 2 (CEPHEID - Performed in [facility] hospital lab), Hosp Order     Status: None   Collection Time: 04/06/19  6:41 PM   Specimen: Nasopharyngeal Swab  Result Value Ref Range Status   SARS Coronavirus 2 NEGATIVE NEGATIVE Final    Comment: (NOTE) If result is NEGATIVE SARS-CoV-2 target nucleic acids are NOT DETECTED. The SARS-CoV-2 RNA is generally detectable in upper and lower  respiratory specimens during the acute phase of infection. The lowest  concentration of SARS-CoV-2 viral copies this assay can detect is 250  copies / mL. A negative result does not preclude SARS-CoV-2 infection  and should not be used as the sole basis for treatment or other  patient management decisions.  A negative result may occur with  improper specimen collection / handling, submission of specimen other  than nasopharyngeal swab, presence of viral mutation(s) within the  areas targeted by this assay, and inadequate number of viral copies  (<250 copies / mL). A negative result must be combined with clinical  observations, patient history, and epidemiological information. If result is POSITIVE SARS-CoV-2 target nucleic acids are  DETECTED. The SARS-CoV-2 RNA is generally detectable in upper and lower  respiratory specimens dur ing the acute phase of infection.  Positive  results are indicative of active infection with SARS-CoV-2.  Clinical  correlation with patient history and other diagnostic information is  necessary to determine patient infection status.  Positive results do  not rule out bacterial infection or co-infection with other viruses. If result is PRESUMPTIVE POSTIVE SARS-CoV-2 nucleic acids MAY BE PRESENT.   A presumptive positive result was obtained on the submitted specimen  and confirmed on repeat testing.  While 2019 novel coronavirus  (SARS-CoV-2) nucleic acids may be present in the submitted sample  additional confirmatory testing may be necessary for epidemiological  and / or clinical management purposes  to differentiate between  SARS-CoV-2 and other Sarbecovirus currently known to infect humans.  If clinically indicated additional testing with an alternate test  methodology [phone]) is advised. The SARS-CoV-2 RNA is generally  detectable in upper and lower respiratory sp ecimens during the acute  phase of infection. The expected result is Negative. Fact Sheet for Patients:  BoilerBrush.com.cyhttps://www.fda.gov/media/136312/download Fact Sheet for Healthcare Providers: https://pope.com/https://www.fda.gov/media/136313/download This test is not yet approved or cleared by the [place] FDA and has been authorized for detection and/or diagnosis of SARS-CoV-2 by FDA under an Emergency Use Authorization (EUA).  This EUA will remain in effect (meaning this test can be used) for the duration of the COVID-19 declaration under Section 564(b)(1) of the Act, 21 U.S.C. section 360bbb-3(b)(1), unless the authorization is terminated or revoked sooner. Performed at [facility], [address] [place], [place] [address]      Labs: BNP (last 3 results) Recent Labs    12/14/18 1723  BNP 98.8   Basic Metabolic  Panel: Recent Labs  Lab 04/06/19 1616 04/06/19 1641 04/07/19 0145 04/07/19 0856  NA 139  --   --  139  K 3.1*  --   --  3.5  CL 102  --   --  104  CO2 27  --   --  26  GLUCOSE 102*  --   --  99  BUN 11  --   --  18  CREATININE 1.21  --   --  1.01  CALCIUM 8.9  --   --  8.7*  MG  --  2.1 2.4  --    Liver Function Tests: No results for input(s): AST,  ALT, ALKPHOS, BILITOT, PROT, ALBUMIN in the last 168 hours. No results for input(s): LIPASE, AMYLASE in the last 168 hours. No results for input(s): AMMONIA in the last 168 hours. CBC: Recent Labs  Lab 04/06/19 1616  WBC 8.1  HGB 14.3  HCT 43.2  MCV 89.8  PLT 366   Cardiac Enzymes: No results for input(s): CKTOTAL, CKMB, CKMBINDEX, TROPONINI in the last 168 hours. BNP: Invalid input(s): POCBNP CBG: No results for input(s): GLUCAP in the last 168 hours. D-Dimer No results for input(s): DDIMER in the last 72 hours. Hgb A1c Recent Labs    04/07/19 0145  HGBA1C 5.8*   Lipid Profile Recent Labs    04/07/19 0856  CHOL 132  HDL 43  LDLCALC 60  TRIG 143  CHOLHDL 3.1   Thyroid function studies No results for input(s): TSH, T4TOTAL, T3FREE, THYROIDAB in the last 72 hours.  Invalid input(s): FREET3 Anemia work up No results for input(s): VITAMINB12, FOLATE, FERRITIN, TIBC, IRON, RETICCTPCT in the last 72 hours. Urinalysis    Component Value Date/Time   COLORURINE YELLOW 12/14/2018 1823   APPEARANCEUR CLEAR 12/14/2018 1823   LABSPEC 1.019 12/14/2018 1823   PHURINE 6.0 12/14/2018 1823   GLUCOSEU NEGATIVE 12/14/2018 1823   HGBUR NEGATIVE 12/14/2018 1823   BILIRUBINUR negative 04/06/2019 1717   KETONESUR negative 04/06/2019 1717   KETONESUR NEGATIVE 12/14/2018 1823   PROTEINUR 30 (A) 12/14/2018 1823   UROBILINOGEN 0.2 04/06/2019 1717   NITRITE Negative 04/06/2019 1717   NITRITE NEGATIVE 12/14/2018 1823   LEUKOCYTESUR Negative 04/06/2019 1717   LEUKOCYTESUR NEGATIVE 12/14/2018 1823   Sepsis Labs Invalid  input(s): PROCALCITONIN,  WBC,  LACTICIDVEN Microbiology Recent Results (from the past 240 hour(s))  SARS Coronavirus 2 (CEPHEID - Performed in [facility] hospital lab), Hosp Order     Status: None   Collection Time: 04/06/19  6:41 PM   Specimen: Nasopharyngeal Swab  Result Value Ref Range Status   SARS Coronavirus 2 NEGATIVE NEGATIVE Final    Comment: (NOTE) If result is NEGATIVE SARS-CoV-2 target nucleic acids are NOT DETECTED. The SARS-CoV-2 RNA is generally detectable in upper and lower  respiratory specimens during the acute phase of infection. The lowest  concentration of SARS-CoV-2 viral copies this assay can detect is 250  copies / mL. A negative result does not preclude SARS-CoV-2 infection  and should not be used as the sole basis for treatment or other  patient management decisions.  A negative result may occur with  improper specimen collection / handling, submission of specimen other  than nasopharyngeal swab, presence of viral mutation(s) within the  areas targeted by this assay, and inadequate number of viral copies  (<250 copies / mL). A negative result must be combined with clinical  observations, patient history, and epidemiological information. If result is POSITIVE SARS-CoV-2 target nucleic acids are DETECTED. The SARS-CoV-2 RNA is generally detectable in upper and lower  respiratory specimens dur ing the acute phase of infection.  Positive  results are indicative of active infection with SARS-CoV-2.  Clinical  correlation with patient history and other diagnostic information is  necessary to determine patient infection status.  Positive results do  not rule out bacterial infection or co-infection with other viruses. If result is PRESUMPTIVE POSTIVE SARS-CoV-2 nucleic acids MAY BE PRESENT.   A presumptive positive result was obtained on the submitted specimen  and confirmed on repeat testing.  While 2019 novel coronavirus  (SARS-CoV-2) nucleic acids may be  present in the submitted sample  additional  confirmatory testing may be necessary for epidemiological  and / or clinical management purposes  to differentiate between  SARS-CoV-2 and other Sarbecovirus currently known to infect humans.  If clinically indicated additional testing with an alternate test  methodology [phone]) is advised. The SARS-CoV-2 RNA is generally  detectable in upper and lower respiratory sp ecimens during the acute  phase of infection. The expected result is Negative. Fact Sheet for Patients:  StrictlyIdeas.no Fact Sheet for Healthcare Providers: BankingDealers.co.za This test is not yet approved or cleared by the [place] FDA and has been authorized for detection and/or diagnosis of SARS-CoV-2 by FDA under an Emergency Use Authorization (EUA).  This EUA will remain in effect (meaning this test can be used) for the duration of the COVID-19 declaration under Section 564(b)(1) of the Act, 21 U.S.C. section 360bbb-3(b)(1), unless the authorization is terminated or revoked sooner. Performed at [facility], [place] [address] [place], [place] [address]      Time coordinating discharge in minutes: 64  SIGNED:   Debbe Odea, MD  [facility] 04/07/2019, 4:03 PM Pager   If 7PM-7AM, please contact night-coverage [email or web address] Password [email or web address]

## 2019-04-07 NOTE — Discharge Instructions (Signed)
Heart-Healthy Eating Plan Many factors influence your heart (coronary) health, including eating and exercise habits. Coronary risk increases with abnormal blood fat (lipid) levels. Heart-healthy meal planning includes limiting unhealthy fats, increasing healthy fats, and making other diet and lifestyle changes. What is my plan? Your health care provider may recommend that you:  Limit your fat intake to _________% or less of your total calories each day.  Limit your saturated fat intake to _________% or less of your total calories each day.  Limit the amount of cholesterol in your diet to less than _________ mg per day. What are tips for following this plan? Cooking Cook foods using methods other than frying. Baking, boiling, grilling, and broiling are all good options. Other ways to reduce fat include:  Removing the skin from poultry.  Removing all visible fats from meats.  Steaming vegetables in water or broth. Meal planning   At meals, imagine dividing your plate into fourths: ? Fill one-half of your plate with vegetables and green salads. ? Fill one-fourth of your plate with whole grains. ? Fill one-fourth of your plate with lean protein foods.  Eat 4-5 servings of vegetables per day. One serving equals 1 cup raw or cooked vegetable, or 2 cups raw leafy greens.  Eat 4-5 servings of fruit per day. One serving equals 1 medium whole fruit,  cup dried fruit,  cup fresh, frozen, or canned fruit, or  cup 100% fruit juice.  Eat more foods that contain soluble fiber. Examples include apples, broccoli, carrots, beans, peas, and barley. Aim to get 25-30 g of fiber per day.  Increase your consumption of legumes, nuts, and seeds to 4-5 servings per week. One serving of dried beans or legumes equals  cup cooked, 1 serving of nuts is  cup, and 1 serving of seeds equals 1 tablespoon. Fats  Choose healthy fats more often. Choose monounsaturated and polyunsaturated fats, such as olive and  canola oils, flaxseeds, walnuts, almonds, and seeds.  Eat more omega-3 fats. Choose salmon, mackerel, sardines, tuna, flaxseed oil, and ground flaxseeds. Aim to eat fish at least 2 times each week.  Check food labels carefully to identify foods with trans fats or high amounts of saturated fat.  Limit saturated fats. These are found in animal products, such as meats, butter, and cream. Plant sources of saturated fats include palm oil, palm kernel oil, and coconut oil.  Avoid foods with partially hydrogenated oils in them. These contain trans fats. Examples are stick margarine, some tub margarines, cookies, crackers, and other baked goods.  Avoid fried foods. General information  Eat more home-cooked food and less restaurant, buffet, and fast food.  Limit or avoid alcohol.  Limit foods that are high in starch and sugar.  Lose weight if you are overweight. Losing just 5-10% of your body weight can help your overall health and prevent diseases such as diabetes and heart disease.  Monitor your salt (sodium) intake, especially if you have high blood pressure. Talk with your health care provider about your sodium intake.  Try to incorporate more vegetarian meals weekly. What foods can I eat? Fruits All fresh, canned (in natural juice), or frozen fruits. Vegetables Fresh or frozen vegetables (raw, steamed, roasted, or grilled). Green salads. Grains Most grains. Choose whole wheat and whole grains most of the time. Rice and pasta, including brown rice and pastas made with whole wheat. Meats and other proteins Lean, well-trimmed beef, veal, pork, and lamb. Chicken and [place] without skin. All fish and shellfish. Wild  duck, rabbit, pheasant, and venison. Egg whites or low-cholesterol egg substitutes. Dried beans, peas, lentils, and tofu. Seeds and most nuts. Dairy Low-fat or nonfat cheeses, including ricotta and mozzarella. Skim or 1% milk (liquid, powdered, or evaporated). Buttermilk made  with low-fat milk. Nonfat or low-fat yogurt. Fats and oils Non-hydrogenated (trans-free) margarines. Vegetable oils, including soybean, sesame, sunflower, olive, peanut, safflower, corn, canola, and cottonseed. Salad dressings or mayonnaise made with a vegetable oil. Beverages Water (mineral or sparkling). Coffee and tea. Diet carbonated beverages. Sweets and desserts Sherbet, gelatin, and fruit ice. Small amounts of dark chocolate. Limit all sweets and desserts. Seasonings and condiments All seasonings and condiments. The items listed above may not be a complete list of foods and beverages you can eat. Contact a dietitian for more options. What foods are not recommended? Fruits Canned fruit in heavy syrup. Fruit in cream or butter sauce. Fried fruit. Limit coconut. Vegetables Vegetables cooked in cheese, cream, or butter sauce. Fried vegetables. Grains Breads made with saturated or trans fats, oils, or whole milk. Croissants. Sweet rolls. Donuts. High-fat crackers, such as cheese crackers. Meats and other proteins Fatty meats, such as hot dogs, ribs, sausage, bacon, rib-eye roast or steak. High-fat deli meats, such as salami and bologna. Caviar. Domestic duck and goose. Organ meats, such as liver. Dairy Cream, sour cream, cream cheese, and creamed cottage cheese. Whole milk cheeses. Whole or 2% milk (liquid, evaporated, or condensed). Whole buttermilk. Cream sauce or high-fat cheese sauce. Whole-milk yogurt. Fats and oils Meat fat, or shortening. Cocoa butter, hydrogenated oils, palm oil, coconut oil, palm kernel oil. Solid fats and shortenings, including bacon fat, salt pork, lard, and butter. Nondairy cream substitutes. Salad dressings with cheese or sour cream. Beverages Regular sodas and any drinks with added sugar. Sweets and desserts Frosting. Pudding. Cookies. Cakes. Pies. Milk chocolate or Ron chocolate. Buttered syrups. Full-fat ice cream or ice cream drinks. The items listed  above may not be a complete list of foods and beverages to avoid. Contact a dietitian for more information. Summary  Heart-healthy meal planning includes limiting unhealthy fats, increasing healthy fats, and making other diet and lifestyle changes.  Lose weight if you are overweight. Losing just 5-10% of your body weight can help your overall health and prevent diseases such as diabetes and heart disease.  Focus on eating a balance of foods, including fruits and vegetables, low-fat or nonfat dairy, lean protein, nuts and legumes, whole grains, and heart-healthy oils and fats. This information is not intended to replace advice given to you by your health care provider. Make sure you discuss any questions you have with your health care provider. Document Released: 07/07/2008 Document Revised: 11/05/2017 Document Reviewed: 11/05/2017 Elsevier Patient Education  2020 [employer]  Prediabetes Eating Plan Prediabetes is a condition that causes blood sugar (glucose) levels to be higher than normal. This increases the risk for developing diabetes. In order to prevent diabetes from developing, your health care provider may recommend a diet and other lifestyle changes to help you:  Control your blood glucose levels.  Improve your cholesterol levels.  Manage your blood pressure. Your health care provider may recommend working with a diet and nutrition specialist (dietitian) to make a meal plan that is best for you. What are tips for following this plan? Lifestyle  Set weight loss goals with the help of your health care team. It is recommended that most people with prediabetes lose 7% of their current body weight.  Exercise for at least 30 minutes  at least 5 days a week.  Attend a support group or seek ongoing support from a mental health counselor.  Take over-the-counter and prescription medicines only as told by your health care provider. Reading food labels  Read food labels to check the  amount of fat, salt (sodium), and sugar in prepackaged foods. Avoid foods that have: ? Saturated fats. ? Trans fats. ? Added sugars.  Avoid foods that have more than 300 milligrams (mg) of sodium per serving. Limit your daily sodium intake to less than 2,300 mg each day. Shopping  Avoid buying pre-made and processed foods. Cooking  Cook with olive oil. Do not use butter, lard, or ghee.  Bake, broil, grill, or boil foods. Avoid frying. Meal planning   Work with your dietitian to develop an eating plan that is right for you. This may include: ? Tracking how many calories you take in. Use a food diary, notebook, or mobile application to track what you eat at each meal. ? Using the glycemic index (GI) to plan your meals. The index tells you how quickly a food will raise your blood glucose. Choose low-GI foods. These foods take a longer time to raise blood glucose.  Consider following a Mediterranean diet. This diet includes: ? Several servings each day of fresh fruits and vegetables. ? Eating fish at least twice a week. ? Several servings each day of whole grains, beans, nuts, and seeds. ? Using olive oil instead of other fats. ? Moderate alcohol consumption. ? Eating small amounts of red meat and whole-fat dairy.  If you have high blood pressure, you may need to limit your sodium intake or follow a diet such as the DASH eating plan. DASH is an eating plan that aims to lower high blood pressure. What foods are recommended? The items listed below may not be a complete list. Talk with your dietitian about what dietary choices are best for you. Grains Whole grains, such as whole-wheat or whole-grain breads, crackers, cereals, and pasta. Unsweetened oatmeal. Bulgur. Barley. Quinoa. Brown rice. Corn or whole-wheat flour tortillas or taco shells. Vegetables Lettuce. Spinach. Peas. Beets. Cauliflower. Cabbage. Broccoli. Carrots. Tomatoes. Squash. Eggplant. Herbs. Peppers. Onions. Cucumbers.  Brussels sprouts. Fruits Berries. Bananas. Apples. Oranges. Grapes. Papaya. Mango. Pomegranate. Kiwi. Grapefruit. Cherries. Meats and other protein foods Seafood. Poultry without skin. Lean cuts of pork and beef. Tofu. Eggs. Nuts. Beans. Dairy Low-fat or fat-free dairy products, such as yogurt, cottage cheese, and cheese. Beverages Water. Tea. Coffee. Sugar-free or diet soda. Seltzer water. Lowfat or no-fat milk. Milk alternatives, such as soy or almond milk. Fats and oils Olive oil. Canola oil. Sunflower oil. Grapeseed oil. Avocado. Walnuts. Sweets and desserts Sugar-free or low-fat pudding. Sugar-free or low-fat ice cream and other frozen treats. Seasoning and other foods Herbs. Sodium-free spices. Mustard. Relish. Low-fat, low-sugar ketchup. Low-fat, low-sugar barbecue sauce. Low-fat or fat-free mayonnaise. What foods are not recommended? The items listed below may not be a complete list. Talk with your dietitian about what dietary choices are best for you. Grains [place] flour and flour products, such as bread, pasta, snack foods, and cereals. Vegetables Canned vegetables. Frozen vegetables with butter or cream sauce. Fruits Fruits canned with syrup. Meats and other protein foods Fatty cuts of meat. Poultry with skin. Breaded or fried meat. Processed meats. Dairy Full-fat yogurt, cheese, or milk. Beverages Sweetened drinks, such as sweet iced tea and soda. Fats and oils Butter. Lard. Ghee. Sweets and desserts Baked goods, such as cake, cupcakes, pastries, cookies, and  cheesecake. Seasoning and other foods Spice mixes with added salt. Ketchup. Barbecue sauce. Mayonnaise. Summary  To prevent diabetes from developing, you may need to make diet and other lifestyle changes to help control blood sugar, improve cholesterol levels, and manage your blood pressure.  Set weight loss goals with the help of your health care team. It is recommended that most people with prediabetes  lose 7 percent of their current body weight.  Consider following a Mediterranean diet that includes plenty of fresh fruits and vegetables, whole grains, beans, nuts, seeds, fish, lean meat, low-fat dairy, and healthy oils. This information is not intended to replace advice given to you by your health care provider. Make sure you discuss any questions you have with your health care provider. Document Released: 02/12/2015 Document Revised: 12/02/2016 Document Reviewed: 12/02/2016 Elsevier Interactive Patient Education  2019 [employer].

## 2019-04-08 LAB — URINE CULTURE

## 2019-05-25 ENCOUNTER — Ambulatory Visit: Payer: BC Managed Care – PPO | Admitting: Internal Medicine

## 2019-07-19 ENCOUNTER — Other Ambulatory Visit: Payer: Self-pay

## 2019-07-19 DIAGNOSIS — Z20822 Contact with and (suspected) exposure to covid-19: Secondary | ICD-10-CM

## 2019-07-19 DIAGNOSIS — Z20828 Contact with and (suspected) exposure to other viral communicable diseases: Secondary | ICD-10-CM | POA: Diagnosis not present

## 2019-07-21 LAB — NOVEL CORONAVIRUS, NAA: SARS-CoV-2, NAA: NOT DETECTED

## 2019-08-16 ENCOUNTER — Ambulatory Visit: Payer: BC Managed Care – PPO | Attending: Family Medicine | Admitting: Family Medicine

## 2019-08-16 ENCOUNTER — Other Ambulatory Visit: Payer: Self-pay

## 2019-08-16 ENCOUNTER — Encounter: Payer: Self-pay | Admitting: Family Medicine

## 2019-08-16 DIAGNOSIS — I10 Essential (primary) hypertension: Secondary | ICD-10-CM | POA: Diagnosis not present

## 2019-08-16 DIAGNOSIS — R0789 Other chest pain: Secondary | ICD-10-CM

## 2019-08-16 DIAGNOSIS — R079 Chest pain, unspecified: Secondary | ICD-10-CM

## 2019-08-16 DIAGNOSIS — K219 Gastro-esophageal reflux disease without esophagitis: Secondary | ICD-10-CM

## 2019-08-16 DIAGNOSIS — R05 Cough: Secondary | ICD-10-CM | POA: Diagnosis not present

## 2019-08-16 DIAGNOSIS — R059 Cough, unspecified: Secondary | ICD-10-CM

## 2019-08-16 MED ORDER — PANTOPRAZOLE SODIUM 40 MG PO TBEC: 40.0000 mg | DELAYED_RELEASE_TABLET | Freq: Two times a day (BID) | ORAL | 4 refills | Status: DC

## 2019-08-16 NOTE — Progress Notes (Signed)
Virtual Visit via Telephone Note  I connected with Richard Hale on 08/16/19 at  9:30 AM EST by telephone and verified that I am speaking with the correct person using two identifiers.   I discussed the limitations, risks, security and privacy concerns of performing an evaluation and management service by telephone and the availability of in person appointments. I also discussed with the patient that there may be a patient responsible charge related to this service. The patient expressed understanding and agreed to proceed.  Patient Location: Home Provider Location: [facility] Others participating in call: None   History of Present Illness:         33 yo male with the complaint of recurrent chest pain. Pain is in the left chest and is sharp and stabbing.  Patient states that he does not know what heart attack feels like but is concerned that he is having a heart attack when he has a chest pain.  He denies any nausea with the chest pain.  He does have occasional increased chest pain with deep breaths.  Patient also on review has had some neck fullness, jaw pain as well as numbness and tingling in the fingers of the left hand when he is having chest pain.  Occasional sweating/diaphoresis with chest pain.  He is currently having chest pain that is about a 4-5 on a 0-to-10 scale.  Chest pain has been up to an 8 or 10.  Patient will feel as if the chest pain is radiating through his chest to his back.  Chest pain can occur at rest or with activity, no inciting factors.         He does have a recent onset of recurrent, nonproductive cough for 2 or more weeks.  No fever or chills.  No sore throat or nasal congestion.  He does not feel as if he is having any increase shortness of breath with current cough.  Patient has found that if he takes his acid reflux medicine that the pain in his chest will ease off after little bit or he also can take Tums to help with the chest pain.  He was prescribed reflux  medicine in the past but he has not been taking the medication daily as he thought that he only needed to take the medication as needed.  Chest pain is similar to pain that he had when he was seen in the emergency department in June however patient states that he has been unable to follow-up with cardiology as he had an outstanding bill at the practice to which he was referred and therefore has not yet been able to follow-up.  He has been taking his blood pressure medication in case his chest pain is related to his blood pressure or heart.  He denies any headaches or dizziness related to blood pressure.  No peripheral edema.  He denies abdominal pain.  He has seen no blood in the stool and has had no black stools.   Past Medical History:  Diagnosis Date   Hypertension    Morbid obesity (HCC)    Obstructive sleep apnea    Tobacco use 04/06/2019    Past Surgical History:  Procedure Laterality Date   NOSE SURGERY      Family History  Problem Relation Age of Onset   Hypertension Mother    Diabetes Maternal Grandmother    CAD Father     Social History   Tobacco Use   Smoking status: Current Every Day Smoker  Packs/day: 0.25    Types: Cigarettes   Smokeless tobacco: Never Used   Tobacco comment: 6 cigs a day  Substance Use Topics   Alcohol use: Yes    Comment: occ   Drug use: No     Allergies  Allergen Reactions   Lisinopril Cough       Observations/Objective: No vital signs or physical exam conducted as visit was done via telephone  Assessment and Plan: 1. Chest pain, unspecified type Patient instructed to go to the emergency department for further evaluation of chest pain.  New referral for cardiology will also be put in place.  Discussed with the patient that it is possible that his acid reflux may be causing his chest pain however with his symptoms of pain in the jaw and numbness and tingling in the left arm, he needs to seek immediate medical evaluation to  make sure that his chest pain is not heart related as he does have cardiac risk factors of obesity, hypertension and obstructive sleep apnea.  We will also place order for chest x-ray due to patient's recent onset of cough as well as chest pain.  2. Gastroesophageal reflux disease, unspecified whether esophagitis present Discussed with patient that acid reflux disease may be contributing to his chest pain and he has been asked to take his pantoprazole twice daily as it may take 4 to 6 weeks to heal up any stomach irritation which may be causing/contributing to his symptoms.  Referral also be placed to gastroenterology for further evaluation.  3.  Essential hypertension Remain compliant with medications as well as low-sodium diet but also have evaluation of emergency department today regarding chest pain and hypertension as patient's BP has often been elevated in office.  4.  Cough Order placed for chest x-ray as patient with cough as well as complaint of chest pain however patient also has GERD which may be a contributing factor.  Patient may obtain chest x-ray if he does not done during ED visit which patient was encouraged to have done today.  Follow Up Instructions:Return for Go to ED today; 1 to 2-week follow-up.   I discussed the assessment and treatment plan with the patient. The patient was provided an opportunity to ask questions and all were answered. The patient agreed with the plan and demonstrated an understanding of the instructions.   The patient was advised to call back or seek an in-person evaluation if the symptoms worsen or if the condition fails to improve as anticipated.  I provided 16 minutes of non-face-to-face time during this encounter.   Antony Blackbird, MD

## 2019-08-16 NOTE — Progress Notes (Signed)
Patient verified DOB Patient has not eaten today. Patient has not taken medication today. Patient complains of intermittent chest pain for the past few months, patient states pain has been constant since 6/25. Patient states cardiology will not see him until he pays a bill. Patient states he has a smokers cough for the past few years but has slowed down due to chest pain. BP two days ago was 275 systolic patient is unable to recall the diastolic.

## 2019-08-28 DIAGNOSIS — R079 Chest pain, unspecified: Secondary | ICD-10-CM | POA: Insufficient documentation

## 2019-08-28 DIAGNOSIS — I429 Cardiomyopathy, unspecified: Secondary | ICD-10-CM | POA: Insufficient documentation

## 2019-08-28 NOTE — Progress Notes (Signed)
Cardiology Office Note   Date:  08/29/2019   ID:  Richard Hale, DOB June 26, 1986, MRN [id number]  PCP:  Cain Saupe, MD  Cardiologist:   No primary care provider on file. Referring:  Cain Saupe, MD    Chief Complaint  Patient presents with  . Cardiomyopathy      History of Present Illness: Richard Hale is a 33 y.o. male who presents for evaluation of chest pain.   He had an echo in June with an EF of 45%.  I reviewed hospital records.  He was in the hospital in March with hypertensive urgency for 1 night.  He did run out of his meds.  He was back in the hospital in June with hypertension.  That is when he was found to have a reduced ejection fraction.  He does not report any palpitations, presyncope or syncope.  He does not have any shortness of breath, PND or orthopnea.  He does have sleep apnea but he does not really wear his CPAP at night.  He has chest discomfort.  This is constant.  Is been going on for quite a while.  It never lets up.  It is 8 out of 10 in intensity at its peak and down to 5 out of 10 in intensity if he takes Tylenol.  He cannot bring this on.  He cannot exacerbated with movement or activity.  Is not radiating to his jaw or to his arms.  Is under the left breast.  It is a sharp discomfort.  He has no association with palpation or movement.  He is still able to be active.  He had this discomfort in the hospital when his high-sensitivity troponin was normal.  It was felt to be atypical.  Of note his blood pressure is probably never well controlled otherwise not taking it routinely at home    Past Medical History:  Diagnosis Date  . Hypertension   . Morbid obesity (HCC)   . Obstructive sleep apnea    CPAP  . Tobacco use 04/06/2019    Past Surgical History:  Procedure Laterality Date  . NOSE SURGERY       Current Outpatient Medications  Medication Sig Dispense Refill  . amLODipine (NORVASC) 10 MG tablet Take 1 tablet (10 mg total) by mouth  daily. 90 tablet 1  . hydrochlorothiazide (HYDRODIURIL) 25 MG tablet Take 1 tablet (25 mg total) by mouth daily. Take on tablet in the morning. 90 tablet 1  . irbesartan (AVAPRO) 300 MG tablet Take 1 tablet (300 mg total) by mouth daily. 90 tablet 1  . labetalol (NORMODYNE) 100 MG tablet Take 1 tablet (100 mg total) by mouth 2 (two) times daily. 60 tablet 0  . pantoprazole (PROTONIX) 40 MG tablet Take 1 tablet (40 mg total) by mouth 2 (two) times daily. To reduce stomach acid 60 tablet 4  . spironolactone (ALDACTONE) 50 MG tablet Take 1 tablet (50 mg total) by mouth daily. 90 tablet 3   Current Facility-Administered Medications  Medication Dose Route Frequency Provider Last Rate Last Dose  . cloNIDine (CATAPRES) tablet 0.1 mg  0.1 mg Oral Once Rollene Rotunda, MD      . cloNIDine (CATAPRES) tablet 0.1 mg  0.1 mg Oral Once Rollene Rotunda, MD        Allergies:   Lisinopril    Social History:  The patient  reports that he has been smoking cigarettes. He has been smoking about 0.25 packs per day. He has  never used smokeless tobacco. He reports current alcohol use. He reports that he does not use drugs.   Family History:  The patient's family history includes CAD (age of onset: 28) in his father; Diabetes in his maternal grandmother; Heart failure in his mother; Hypertension in his mother.    ROS:  Please see the history of present illness.   Otherwise, review of systems are positive for none.   All other systems are reviewed and negative.    PHYSICAL EXAM: VS:  BP (!) 182/124   Pulse 93   Ht 6\' 3"  (1.905 m)   Wt (!) 360 lb 6.4 oz (163.5 kg)   BMI 45.05 kg/m  , BMI Body mass index is 45.05 kg/m. GENERAL:  Well appearing HEENT:  Pupils equal round and reactive, fundi not visualized, oral mucosa unremarkable NECK:  No jugular venous distention, waveform within normal limits, carotid upstroke brisk and symmetric, no bruits, no thyromegaly LYMPHATICS:  No cervical, inguinal  adenopathy LUNGS:  Clear to auscultation bilaterally BACK:  No CVA tenderness CHEST:  Unremarkable HEART:  PMI not displaced or sustained,S1 and S2 within normal limits, no S3, no S4, no clicks, no rubs, no murmurs ABD:  Flat, positive bowel sounds normal in frequency in pitch, no bruits, no rebound, no guarding, no midline pulsatile mass, no hepatomegaly, no splenomegaly EXT:  2 plus pulses throughout, no edema, no cyanosis no clubbing SKIN:  No rashes no nodules NEURO:  Cranial nerves II through XII grossly intact, motor grossly intact throughout PSYCH:  Cognitively intact, oriented to person place and time    EKG:  EKG is ordered today. The ekg ordered today demonstrates sinus rhythm, rate 93, left axis deviation, borderline QT prolongation, lateral T wave inversions consistent with repolarization changes.  This is not different from previous.   Recent Labs: 12/14/2018: ALT 21; B Natriuretic Peptide 98.8 04/06/2019: Hemoglobin 14.3; Platelets 366 04/07/2019: BUN 18; Creatinine, Ser 1.01; Magnesium 2.4; Potassium 3.5; Sodium 139    Lipid Panel    Component Value Date/Time   CHOL 132 04/07/2019 0856   CHOL 136 08/08/2018 1651   TRIG 143 04/07/2019 0856   HDL 43 04/07/2019 0856   HDL 49 08/08/2018 1651   CHOLHDL 3.1 04/07/2019 0856   VLDL 29 04/07/2019 0856   LDLCALC 60 04/07/2019 0856   LDLCALC 65 08/08/2018 1651      Wt Readings from Last 3 Encounters:  08/29/19 (!) 360 lb 6.4 oz (163.5 kg)  04/06/19 (!) 380 lb (172.4 kg)  04/06/19 (!) 383 lb (173.7 kg)      Other studies Reviewed: Additional studies/ records that were reviewed today include: Hospital records and echo. . Review of the above records demonstrates:  Please see elsewhere in the note.     ASSESSMENT AND PLAN:  CHEST PAIN:   Chest pain is atypical.  There is no reason to suspect an ischemic etiology.  He had high-sensitivity troponins despite ongoing severe pain.  I suspect a musculoskeletal or GI source I  referred him back to his primary provider.  CARDIOMYOPATHY: This is very likely related to hypertension he needs continued med adjustment as below.  I will consider other work-up although I am not strongly suspecting ischemic or infiltrative etiology.  MORBID OBESITY: We had a long discussion about this and I gave him very specific instructions.  He understands that this is a life limiting issue.  TOBACCO ABUSE : I gave him the number for [id number].  HTN: We gave him clonidine 0.1 mg  twice in the office.  He and I spent a long time talking about the need for blood pressure control.  I did add spironolactone 50 mg daily to his regimen and he will need a basic metabolic profile in 1 week.  We will see him back in a few weeks for further med titration.  He should keep a blood pressure diary.  SLEEP APNEA: He says he could work with the machine and he could get it working and he understands the importance of this and he will try to comply.  Current medicines are reviewed at length with the patient today.  The patient does not have concerns regarding medicines.  The following changes have been made:  As above.   Labs/ tests ordered today include:   Orders Placed This Encounter  Procedures  . Basic metabolic panel  . EKG 12-Lead     Disposition:   FU with APP in one month.       Signed, Rollene Rotunda, MD  08/29/2019 5:09 PM    [facility]

## 2019-08-29 ENCOUNTER — Encounter: Payer: Self-pay | Admitting: Cardiology

## 2019-08-29 ENCOUNTER — Ambulatory Visit (INDEPENDENT_AMBULATORY_CARE_PROVIDER_SITE_OTHER): Payer: BC Managed Care – PPO | Admitting: Cardiology

## 2019-08-29 ENCOUNTER — Other Ambulatory Visit: Payer: Self-pay

## 2019-08-29 VITALS — BP 182/124 | HR 93 | Ht 75.0 in | Wt 360.4 lb

## 2019-08-29 DIAGNOSIS — I10 Essential (primary) hypertension: Secondary | ICD-10-CM

## 2019-08-29 DIAGNOSIS — I429 Cardiomyopathy, unspecified: Secondary | ICD-10-CM | POA: Diagnosis not present

## 2019-08-29 DIAGNOSIS — R079 Chest pain, unspecified: Secondary | ICD-10-CM

## 2019-08-29 MED ORDER — CLONIDINE HCL 0.2 MG PO TABS: 0.1000 mg | ORAL_TABLET | Freq: Once | ORAL | Status: DC

## 2019-08-29 MED ORDER — SPIRONOLACTONE 50 MG PO TABS: 50.0000 mg | ORAL_TABLET | Freq: Every day | ORAL | 3 refills | Status: DC

## 2019-08-29 NOTE — Patient Instructions (Addendum)
Medication Instructions:  Start taking 50mg  Spironolactone Daily.   If you need a refill on your cardiac medications before your next appointment, please call your pharmacy.   Lab work: BMET in 10 days If you have labs (blood work) drawn today and your tests are completely normal, you will receive your results only by: [place] (if you have MyChart) OR A paper copy in the mail If you have any lab test that is abnormal or we need to change your treatment, we will call you to review the results.  Testing/Procedures: NONE  Follow-Up: At [facility], you and your health needs are our priority.  As part of our continuing mission to provide you with exceptional heart care, we have created designated Provider Care Teams.  These Care Teams include your primary Cardiologist (physician) and Advanced Practice Providers (APPs -  Physician Assistants and Nurse Practitioners) who all work together to provide you with the care you need, when you need it. You may see Dr Percival Spanish or one of the following Advanced Practice Providers on your designated Care Team:    Kerin Ransom, PA-C  [place], [place]  Coletta Memos, [place]  Your physician wants you to follow-up in: 1 month with Rosaria Ferries, PA  Any other instructions will be listed below:  1-800-Quit Now

## 2019-09-19 ENCOUNTER — Telehealth: Payer: Self-pay | Admitting: Physician Assistant

## 2019-09-19 NOTE — Telephone Encounter (Signed)
Per Rosaria Ferries, PA---  Called pt and left message to inform that Suanne Marker will be working in the hospital on Friday, 09/22/19, and his appointment will need to be changed. Offered virtual appointment with Fabian Sharp, [place] on 09/27/19. Asked pt to call back.

## 2019-09-21 ENCOUNTER — Encounter: Payer: Self-pay | Admitting: Gastroenterology

## 2019-09-21 NOTE — Telephone Encounter (Signed)
Attempted to reach pt again with no answer. Left a message for the pt and sent MyChart message. Pt appointment rescheduled to 12/16 with Fabian Sharp, PA at 10:00 AM as virtual. Asked pt to call back with questions/concerns and if he needs to change appointment.

## 2019-09-22 ENCOUNTER — Telehealth: Payer: BC Managed Care – PPO | Admitting: Physician Assistant

## 2019-09-26 NOTE — Progress Notes (Signed)
Virtual Visit via Telephone Note   This visit type was conducted due to national recommendations for restrictions regarding the COVID-19 Pandemic (e.g. social distancing) in an effort to limit this patient's exposure and mitigate transmission in our community.  Due to his co-morbid illnesses, this patient is at least at moderate risk for complications without adequate follow up.  This format is felt to be most appropriate for this patient at this time.  The patient did not have access to video technology/had technical difficulties with video requiring transitioning to audio format only (telephone).  All issues noted in this document were discussed and addressed.  No physical exam could be performed with this format.  Please refer to the patient's chart for his  consent to telehealth for [facility].   Date:  09/26/2019   ID:  AYOUB DOCKETT, DOB 1986-05-20, MRN [id number]  Patient Location: Home Provider Location: Office  PCP:  Cain Saupe, MD  Cardiologist:  Rollene Rotunda, MD  Electrophysiologist:  None   Evaluation Performed:  Follow-Up Visit  Chief Complaint:  CP and hypertension  History of Present Illness:    Richard Hale is a 33 y.o. male with history of chronic systolic heart failure with an EF of 45%, OSA not on CPAP, hypertension, and medication noncompliance.  He was hospitalized in March with hypertensive urgency due to running out of his medication.  He was hospitalized again in June with hypertension and found to have a reduced EF.  He was last seen by Dr. Antoine Poche in clinic on 08/29/2019.  At that time he reported chest discomfort that was constant.  He reported having this chest discomfort in the hospital with a high-sensitivity troponin that was normal.  Chest pressure was felt to be atypical.  Hochrein suspected uncontrolled hypertension at that time.  That visit he was maintained on 10 mg of amlodipine, 25 mg HCTZ, 300 mg irbesartan, and labetalol twice daily,  50 mg spironolactone.  He was given 0.1 mg clonidine in the office.  He is seen via virtual platform today and states that his chest pain is better.  This is chronic in nature and labile.  He noticed that it is more intense on some days and less intense on other days, describing it as sharp pain under his left breast.  He is not able to bring on chest pain with physical activity and nothing relieves his chest.  He remains very physically active at work and walks for his entire shift.  He was doing fairly walks outside 1-2 times per week but due to Covid has discontinued his walking outside of work.  He has a exercise bike at his home that he will start using 15 minutes/day.  He continues to smoke 5 to 6 cigarettes/day which is down from 10-15 daily 1 month ago.  I will have him present to the pharmacy for blood pressure evaluation, send a blood pressure cuff to his home, give him salty 6 sheet, encourage patient to be more physically active, and send him smoking cessation information.  He denies  shortness of breath, lower extremity edema, fatigue, palpitations, melena, hematuria, hemoptysis, diaphoresis, weakness, presyncope, syncope, orthopnea, and PND.   The patient does not have symptoms concerning for COVID-19 infection (fever, chills, cough, or new shortness of breath).    Past Medical History:  Diagnosis Date  . Hypertension   . Morbid obesity (HCC)   . Obstructive sleep apnea    CPAP  . Tobacco use 04/06/2019   Past  Surgical History:  Procedure Laterality Date  . NOSE SURGERY       No outpatient medications have been marked as taking for the 09/27/19 encounter (Appointment) with Ledora Bottcher, [place].   Current Facility-Administered Medications for the 09/27/19 encounter (Appointment) with Ledora Bottcher, PA  Medication  . cloNIDine (CATAPRES) tablet 0.1 mg  . cloNIDine (CATAPRES) tablet 0.1 mg     Allergies:   Lisinopril   Social History   Tobacco Use  . Smoking  status: Current Every Day Smoker    Packs/day: 0.25    Types: Cigarettes  . Smokeless tobacco: Never Used  . Tobacco comment: 6 cigs a day  Substance Use Topics  . Alcohol use: Yes    Comment: occ  . Drug use: No     Family Hx: The patient's family history includes CAD (age of onset: 95) in his father; Diabetes in his maternal grandmother; Heart failure in his mother; Hypertension in his mother.  ROS:   Please see the history of present illness.     All other systems reviewed and are negative.   Prior CV studies:   The following studies were reviewed today:  Echocardiogram 04/07/2019 IMPRESSIONS    1. The left ventricle has a visually estimated ejection fraction of 45%. The cavity size was mildly dilated. There is moderately increased left ventricular wall thickness. Left ventricular diastolic Doppler parameters are consistent with  pseudonormalization. Left ventrical global hypokinesis without regional wall motion abnormalities.  2. The right ventricle has normal systolic function. The cavity was normal. There is no increase in right ventricular wall thickness.  3. No evidence of mitral valve stenosis. No mitral regurgitation.  4. The aortic valve is tricuspid. No stenosis of the aortic valve.  5. The aortic root is normal in size and structure. No complete TR doppler jet so unable to estimate PA systolic pressure.  Labs/Other Tests and Data Reviewed:    EKG:    08/29/2019: Normal sinus rhythm LVH QRS widening and repolarization abnormality 93 bpm  Recent Labs: 12/14/2018: ALT 21; B Natriuretic Peptide 98.8 04/06/2019: Hemoglobin 14.3; Platelets 366 04/07/2019: BUN 18; Creatinine, Ser 1.01; Magnesium 2.4; Potassium 3.5; Sodium 139   Recent Lipid Panel Lab Results  Component Value Date/Time   CHOL 132 04/07/2019 08:56 AM   CHOL 136 08/08/2018 04:51 PM   TRIG 143 04/07/2019 08:56 AM   HDL 43 04/07/2019 08:56 AM   HDL 49 08/08/2018 04:51 PM   CHOLHDL 3.1 04/07/2019  08:56 AM   LDLCALC 60 04/07/2019 08:56 AM   LDLCALC 65 08/08/2018 04:51 PM    Wt Readings from Last 3 Encounters:  08/29/19 (!) 360 lb 6.4 oz (163.5 kg)  04/06/19 (!) 380 lb (172.4 kg)  04/06/19 (!) 383 lb (173.7 kg)     Objective:    Vital Signs: Patient to present to pharmacy next week for BP/heart rate evaluation.    ASSESSMENT & PLAN:    Chest pain-slight chest pain today.  Chronic in nature.   Atypical, high-sensitivity troponin was negative while having chest discomfort. Continue to monitor  Hypertension-no blood pressure today.  No blood pressure cuff available. Continue 10 mg of amlodipine Continue 25 mg HCTZ Continue 300 mg irbesartan Continue labetalol 100 mg twice daily Continue 50 mg spironolactone daily Heart healthy low-sodium diet-salty 6 given Increase physical activity as tolerated Continue weight loss Patient to present to pharmacy next week for blood pressure evaluation We will send patient home blood pressure cuff.  Cardiomyopathy/Chronic systolic heart failure-no  increased activity intolerance or dyspnea on exertion today, EF 45%.Suspect this is related to his uncontrolled hypertension Continue 10 mg of amlodipine Continue 25 mg HCTZ Continue 300 mg irbesartan Continue labetalol 100 mg twice daily Continue 50 mg spironolactone daily Heart healthy low-sodium diet-salty 6 given Increase physical activity as tolerated Continue weight loss  Morbid obesity-weight today 388 pounds fully clothed.  Encouraged wearing the same time each day after voiding. Heart healthy low-sodium diet Increase physical activity Continue weight loss   Tobacco use-currently using 5-6 cigarettes/day which is down from 10 to 15 cigarettes 1 month ago. Previous reduction in tobacco use Smoking cessation handout given   OSA-compliant with CPAP Continue weight loss Continue CPAP use  COVID-19 Education: The signs and symptoms of COVID-19 were discussed with the patient  and how to seek care for testing (follow up with PCP or arrange E-visit).  The importance of social distancing was discussed today.  Time:   Today, I have spent 15 minutes with the patient with telehealth technology discussing the above problems.     Medication Adjustments/Labs and Tests Ordered: Current medicines are reviewed at length with the patient today.  Concerns regarding medicines are outlined above.   Tests Ordered: No orders of the defined types were placed in this encounter.   Medication Changes: No orders of the defined types were placed in this encounter.   Follow Up:  Virtual Visit  in 1 month(s)  Signed,  Thomasene Ripple. Pedro Oldenburg NP-C       [facility] [address] Office [phone] Fax [phone]

## 2019-09-27 ENCOUNTER — Telehealth (INDEPENDENT_AMBULATORY_CARE_PROVIDER_SITE_OTHER): Payer: BC Managed Care – PPO | Admitting: General Practice

## 2019-09-27 ENCOUNTER — Encounter: Payer: Self-pay | Admitting: General Practice

## 2019-09-27 VITALS — Ht 74.0 in | Wt 388.0 lb

## 2019-09-27 DIAGNOSIS — I10 Essential (primary) hypertension: Secondary | ICD-10-CM | POA: Diagnosis not present

## 2019-09-27 DIAGNOSIS — I429 Cardiomyopathy, unspecified: Secondary | ICD-10-CM

## 2019-09-27 DIAGNOSIS — G4733 Obstructive sleep apnea (adult) (pediatric): Secondary | ICD-10-CM

## 2019-09-27 DIAGNOSIS — Z720 Tobacco use: Secondary | ICD-10-CM

## 2019-09-27 DIAGNOSIS — R079 Chest pain, unspecified: Secondary | ICD-10-CM

## 2019-09-27 NOTE — Patient Instructions (Addendum)
Medication Instructions:  Your physician recommends that you continue on your current medications as directed. Please refer to the Current Medication list given to you today.  *If you need a refill on your cardiac medications before your next appointment, please call your pharmacy*   Follow-Up: At [name], you and your health needs are our priority.  As part of our continuing mission to provide you with exceptional heart care, we have created designated Provider Care Teams.  These Care Teams include your primary Cardiologist (physician) and Advanced Practice Providers (APPs -  Physician Assistants and Nurse Practitioners) who all work together to provide you with the care you need, when you need it.  Your next appointment:    You will be scheduled for a blood pressure check appointment with our Pharmacists for next week.  You have been scheduled for a 1 month and a two month appointment to follow up with Richard Hale, Richard Hale. If your blood pressure is looking better at your next visit with pharmacy, we will cancel the 1 month follow-up appointment and keep the 2 month appointment for follow-up with [name]. We will call you to let you know.   Other Instructions:  Richard Hale has recommended to weigh yourself every day and keep a blood pressure log. I have attached a log to record your blood pressure and heart rate. Please also record your weight.  Richard Hale has also recommended that you increase physical activity and use your exercise bike at least 5-6 times per week. Please also limit your caffeine intake.  Please review "The [place]ty [employer]" sheet I have attached and the information below about tobacco cessation.  I have left a blood pressure cuff at our front desk, available for pick up within the next 30 days. It is free. Please use this to take your blood pressure daily and bring your recordings to your appointment with our pharmacy clinic.   Steps to Quit Smoking Smoking tobacco is the leading  cause of preventable death. It can affect almost every organ in the body. Smoking puts you and people around you at risk for many serious, long-lasting (chronic) diseases. Quitting smoking can be hard, but it is one of the best things that you can do for your health. It is never too late to quit. How do I get ready to quit? When you decide to quit smoking, make a plan to help you succeed. Before you quit:  Pick a date to quit. Set a date within the next 2 weeks to give you time to prepare.  Write down the reasons why you are quitting. Keep this list in places where you will see it often.  Tell your family, friends, and co-workers that you are quitting. Their support is important.  Talk with your doctor about the choices that may help you quit.  Find out if your health insurance will pay for these treatments.  Know the people, places, things, and activities that make you want to smoke (triggers). Avoid them. What first steps can I take to quit smoking?  Throw away all cigarettes at home, at work, and in your car.  Throw away the things that you use when you smoke, such as ashtrays and lighters.  Clean your car. Make sure to empty the ashtray.  Clean your home, including curtains and carpets. What can I do to help me quit smoking? Talk with your doctor about taking medicines and seeing a counselor at the same time. You are more likely to succeed when you do  both.  If you are pregnant or breastfeeding, talk with your doctor about counseling or other ways to quit smoking. Do not take medicine to help you quit smoking unless your doctor tells you to do so. To quit smoking: Quit right away  Quit smoking totally, instead of slowly cutting back on how much you smoke over a period of time.  Go to counseling. You are more likely to quit if you go to counseling sessions regularly. Take medicine You may take medicines to help you quit. Some medicines need a prescription, and some you can buy  over-the-counter. Some medicines may contain a drug called nicotine to replace the nicotine in cigarettes. Medicines may:  Help you to stop having the desire to smoke (cravings).  Help to stop the problems that come when you stop smoking (withdrawal symptoms). Your doctor may ask you to use:  Nicotine patches, gum, or lozenges.  Nicotine inhalers or sprays.  Non-nicotine medicine that is taken by mouth. Find resources Find resources and other ways to help you quit smoking and remain smoke-free after you quit. These resources are most helpful when you use them often. They include:  Online chats with a Veterinary surgeon.  Phone quitlines.  Printed Materials engineer.  Support groups or group counseling.  Text messaging programs.  Mobile phone apps. Use apps on your mobile phone or tablet that can help you stick to your quit plan. There are many free apps for mobile phones and tablets as well as websites. Examples include Quit Guide from the [employer] and smokefree.gov  What things can I do to make it easier to quit?   Talk to your family and friends. Ask them to support and encourage you.  Call a phone quitline (1-800-QUIT-NOW), reach out to support groups, or work with a Veterinary surgeon.  Ask people who smoke to not smoke around you.  Avoid places that make you want to smoke, such as: ? Bars. ? Parties. ? Smoke-break areas at work.  Spend time with people who do not smoke.  Lower the stress in your life. Stress can make you want to smoke. Try these things to help your stress: ? Getting regular exercise. ? Doing deep-breathing exercises. ? Doing yoga. ? Meditating. ? Doing a body scan. To do this, close your eyes, focus on one area of your body at a time from head to toe. Notice which parts of your body are tense. Try to relax the muscles in those areas. How will I feel when I quit smoking? Day 1 to 3 weeks Within the first 24 hours, you may start to have some problems that come from  quitting tobacco. These problems are very bad 2-3 days after you quit, but they do not often last for more than 2-3 weeks. You may get these symptoms:  Mood swings.  Feeling restless, nervous, angry, or annoyed.  Trouble concentrating.  Dizziness.  Strong desire for high-sugar foods and nicotine.  Weight gain.  Trouble pooping (constipation).  Feeling like you may vomit (nausea).  Coughing or a sore throat.  Changes in how the medicines that you take for other issues work in your body.  Depression.  Trouble sleeping (insomnia). Week 3 and afterward After the first 2-3 weeks of quitting, you may start to notice more positive results, such as:  Better sense of smell and taste.  Less coughing and sore throat.  Slower heart rate.  Lower blood pressure.  Clearer skin.  Better breathing.  Fewer sick days. Quitting smoking can be  hard. Do not give up if you fail the first time. Some people need to try a few times before they succeed. Do your best to stick to your quit plan, and talk with your doctor if you have any questions or concerns. Summary  Smoking tobacco is the leading cause of preventable death. Quitting smoking can be hard, but it is one of the best things that you can do for your health.  When you decide to quit smoking, make a plan to help you succeed.  Quit smoking right away, not slowly over a period of time.  When you start quitting, seek help from your doctor, family, or friends. This information is not intended to replace advice given to you by your health care provider. Make sure you discuss any questions you have with your health care provider. Document Released: 07/25/2009 Document Revised: 12/16/2018 Document Reviewed: 12/17/2018 Elsevier Patient Education  2020 [employer].

## 2019-09-28 ENCOUNTER — Telehealth: Payer: Self-pay | Admitting: Licensed Clinical Social Worker

## 2019-09-28 NOTE — Telephone Encounter (Signed)
CSW referred to assist patient with obtaining a BP cuff. CSW contacted patient to inform cuff will be delivered to home. Patient grateful for support and assistance. CSW available as needed. Jackie Quinette Hentges, LCSW, CCSW-MCS [phone]  

## 2019-10-03 ENCOUNTER — Ambulatory Visit (INDEPENDENT_AMBULATORY_CARE_PROVIDER_SITE_OTHER): Payer: BC Managed Care – PPO | Admitting: Pharmacist Clinician (PhC)/ Clinical Pharmacy Specialist

## 2019-10-03 ENCOUNTER — Other Ambulatory Visit: Payer: Self-pay

## 2019-10-03 DIAGNOSIS — I10 Essential (primary) hypertension: Secondary | ICD-10-CM | POA: Diagnosis not present

## 2019-10-03 DIAGNOSIS — Z720 Tobacco use: Secondary | ICD-10-CM | POA: Diagnosis not present

## 2019-10-03 NOTE — Progress Notes (Signed)
10/04/2019 [name] 10-12-1986 [id number]   HPI:  Richard Hale is a 33 y.o. male patient of Dr Percival Spanish, with a PMH below who presents today for hypertension clinic evaluation.  See pertinent medical history below.  Mr. Bordon saw Dr. Percival Spanish in mid-November and was found to have a BP of 182/124.  He was given clonidine 0.1 mg x 2 doses while in office, asked to keep a BP log and return in 2 weeks.  At that time he saw Coletta Memos NP via telehealth visit.  He does not have a home BP cuff and thus had no readings to share.  Because of this, no medication changes were made.   A home BP cuff was ordered for patient with extra large cuff.    Today patient returns for follow up .  He has resistant hypertension by definition (ARB, diuretic, CCB all at max doses, plus 2 other medications).  He states compliance with medications, takes all 5 at one time around 12:30-1 am when he has break at work.  Has not been taking the labetalol twice daily.   He is trying to quit smoking, now down to 4-5 cigarettes per day.  Smokes 2 at work each night (one on each break).  Partner has recently quit and now he has to go outside as she won't let him smoke in the house.    Past Medical History: HFrEF EF 45%  Medication non-compliance Takes all meds x 1, not doing bid dosing  Tobacco abuse +/- 5 cigarettes per day; trying to quit  OSA Not on CPAP - states can't sleep on his back  obesity BMI 45.1     Blood Pressure Goal:  130/80  Current Medications: amlodipine 10 mg, hydrochlorothiazide 25 mg, irbesartan 300 mg, spironolactone 50 mg - all daily, labetolol 100 mg twice daily  Family Hx: mother - CHF, has ICD, hypertension, pre-diabetic; now 31  Father - died from [place] at 25  1 half brother (41) father side, no issues  MGM htn, diabetes, MI due to Covid  3 kids, pre-teens  Social Hx: smokes - was up to 12/day, now 4-5 cigarettes per day;  Girlfriend makes him go outside to smoke  Rare  alcohol, not even monthly; plenty of caffeine (coffee home brewed), likes all sodas (pepsi, mt, dew)  Diet: more fish less pork, likes veggies; snacks on BBQ chips, muffins, not much for sweet tooth  Exercise: exercise bike was told 15 min 2 x day - has done this consistently since talking to Coletta Memos NP  Home BP readings: cuff was sent thru [employer] (may take extra time due to holidays/Covid vaccine distribution)  Intolerances: lisinopril - cough  Labs: has not done since starting spironolactone  Wt Readings from Last 3 Encounters:  09/27/19 (!) 388 lb (176 kg)  08/29/19 (!) 360 lb 6.4 oz (163.5 kg)  04/06/19 (!) 380 lb (172.4 kg)   BP Readings from Last 3 Encounters:  10/03/19 (!) 180/120  08/29/19 (!) 182/124  04/07/19 (!) 167/123   Pulse Readings from Last 3 Encounters:  10/03/19 89  08/29/19 93  04/07/19 60    Current Outpatient Medications  Medication Sig Dispense Refill  . amLODipine (NORVASC) 10 MG tablet Take 1 tablet (10 mg total) by mouth daily. 90 tablet 1  . hydrochlorothiazide (HYDRODIURIL) 25 MG tablet Take 1 tablet (25 mg total) by mouth daily. Take on tablet in the morning. 90 tablet 1  . irbesartan (AVAPRO) 300 MG  tablet Take 1 tablet (300 mg total) by mouth daily. 90 tablet 1  . labetalol (NORMODYNE) 100 MG tablet Take 1 tablet (100 mg total) by mouth 2 (two) times daily. 60 tablet 0  . pantoprazole (PROTONIX) 40 MG tablet Take 1 tablet (40 mg total) by mouth 2 (two) times daily. To reduce stomach acid 60 tablet 4  . spironolactone (ALDACTONE) 50 MG tablet Take 1 tablet (50 mg total) by mouth daily. 90 tablet 3   Current Facility-Administered Medications  Medication Dose Route Frequency Provider Last Rate Last Admin  . cloNIDine (CATAPRES) tablet 0.1 mg  0.1 mg Oral Once Rollene Rotunda, MD      . cloNIDine (CATAPRES) tablet 0.1 mg  0.1 mg Oral Once Rollene Rotunda, MD        Allergies  Allergen Reactions  . Lisinopril Cough    Past Medical  History:  Diagnosis Date  . Hypertension   . Morbid obesity (HCC)   . Obstructive sleep apnea    CPAP  . Tobacco use 04/06/2019    Blood pressure (!) 180/120, pulse 89, resp. rate 16, SpO2 96 %.  Tobacco use Patient is trying to quit by cutting back on cigarettes.  Currently at 4-5 per day.  Advised he set goals of cutting out 1 cigarette each week for next 4-5 weeks, then he can be done.  Partner recently quit and encouraging him to do so as well.  We talked about being a positive influence on his 3 kids (13, 25, 10).  He is continuing to to work on this.    Hypertension Patient with resistant systolic and diastolic hypertension.  Will have him divide his medication for better compliance with twice daily medication.  He will now take labetalol with irbesartan and hydrochlorothiazide around 1 am at work, then the second dose of labetalol with amlodipine and spironolactone (2 tabs) each morning after work, around 10 am (before going to bed).  We talked about the difference between primary and secondary hypertension.  He will need to work on lifestyle modifications, and was praised for getting on exercise bike 15 minutes twice daily almost all days since speaking to [place].  Encouraged him to cut back on sodas (again discussed being positive role model for his kids) and continue with exercise.  We need to have a discussion about dietary changes, but probably not the best to do 3 days before Christmas.  Will save that for next visit.  Will also look into potential secondary causes before his next appointment.  He is scheduled to see Verdon Cummins in about 3 weeks and we can follow up with him after as necessary.     Phillips Hay PharmD CPP [facility] [address] [place], [place] [address] [phone]

## 2019-10-03 NOTE — Patient Instructions (Addendum)
Return for a a follow up appointment next month  Check your blood pressure at home daily (if able) and keep record of the readings.  Take your BP meds as follows:  1am:  Labetalol 100 mg, hydrochlorothiazide 25 mg, irbesartan 300 mg  10am: labetalol 100 mg, amlodipine 10, spironolactone 50 mg  Bring all of your meds, your BP cuff and your record of home blood pressures to your next appointment.  Exercise as you're able, try to walk approximately 30 minutes per day.  Keep salt intake to a minimum, especially watch canned and prepared boxed foods.  Eat more fresh fruits and vegetables and fewer canned items.  Avoid eating in fast food restaurants.    HOW TO TAKE YOUR BLOOD PRESSURE: . Rest 5 minutes before taking your blood pressure. .  Don't smoke or drink caffeinated beverages for at least 30 minutes before. . Take your blood pressure before (not after) you eat. . Sit comfortably with your back supported and both feet on the floor (don't cross your legs). . Elevate your arm to heart level on a table or a desk. . Use the proper sized cuff. It should fit smoothly and snugly around your bare upper arm. There should be enough room to slip a fingertip under the cuff. The bottom edge of the cuff should be 1 inch above the crease of the elbow. . Ideally, take 3 measurements at one sitting and record the average.

## 2019-10-04 ENCOUNTER — Encounter: Payer: Self-pay | Admitting: Pharmacist Clinician (PhC)/ Clinical Pharmacy Specialist

## 2019-10-04 NOTE — Assessment & Plan Note (Signed)
Patient with resistant systolic and diastolic hypertension.  Will have him divide his medication for better compliance with twice daily medication.  He will now take labetalol with irbesartan and hydrochlorothiazide around 1 am at work, then the second dose of labetalol with amlodipine and spironolactone (2 tabs) each morning after work, around 10 am (before going to bed).  We talked about the difference between primary and secondary hypertension.  He will need to work on lifestyle modifications, and was praised for getting on exercise bike 15 minutes twice daily almost all days since speaking to [place].  Encouraged him to cut back on sodas (again discussed being positive role model for his kids) and continue with exercise.  We need to have a discussion about dietary changes, but probably not the best to do 3 days before Christmas.  Will save that for next visit.  Will also look into potential secondary causes before his next appointment.  He is scheduled to see Denyse Amass in about 3 weeks and we can follow up with him after as necessary.

## 2019-10-04 NOTE — Assessment & Plan Note (Signed)
Patient is trying to quit by cutting back on cigarettes.  Currently at 4-5 per day.  Advised he set goals of cutting out 1 cigarette each week for next 4-5 weeks, then he can be done.  Partner recently quit and encouraging him to do so as well.  We talked about being a positive influence on his 3 kids (84, 26, 38).  He is continuing to to work on this.

## 2019-10-11 ENCOUNTER — Other Ambulatory Visit: Payer: Self-pay | Admitting: Family Medicine

## 2019-10-11 DIAGNOSIS — I10 Essential (primary) hypertension: Secondary | ICD-10-CM

## 2019-10-12 ENCOUNTER — Encounter: Payer: Self-pay | Admitting: Family Medicine

## 2019-10-16 ENCOUNTER — Other Ambulatory Visit: Payer: Self-pay

## 2019-10-16 ENCOUNTER — Other Ambulatory Visit: Payer: Self-pay | Admitting: Pharmacist

## 2019-10-16 MED ORDER — LABETALOL HCL 100 MG PO TABS: 100.0000 mg | ORAL_TABLET | Freq: Two times a day (BID) | ORAL | 3 refills | Status: DC

## 2019-10-16 MED ORDER — LABETALOL HCL 100 MG PO TABS: 100.0000 mg | ORAL_TABLET | Freq: Two times a day (BID) | ORAL | 0 refills | Status: DC

## 2019-10-16 MED ORDER — SPIRONOLACTONE 50 MG PO TABS: 50.0000 mg | ORAL_TABLET | Freq: Every day | ORAL | 0 refills | Status: DC

## 2019-10-16 MED ORDER — SPIRONOLACTONE 50 MG PO TABS: 50.0000 mg | ORAL_TABLET | Freq: Every day | ORAL | 3 refills | Status: DC

## 2019-10-17 ENCOUNTER — Encounter: Payer: Self-pay | Admitting: Family Medicine

## 2019-10-17 ENCOUNTER — Encounter (HOSPITAL_COMMUNITY): Payer: Self-pay | Admitting: Emergency Medicine

## 2019-10-17 ENCOUNTER — Other Ambulatory Visit: Payer: Self-pay

## 2019-10-17 ENCOUNTER — Emergency Department (HOSPITAL_COMMUNITY)
Admission: EM | Admit: 2019-10-17 | Discharge: 2019-10-17 | Discharge status: Against Medical Advice | Payer: BC Managed Care – PPO | Attending: Emergency Medicine | Admitting: Emergency Medicine

## 2019-10-17 ENCOUNTER — Emergency Department (HOSPITAL_COMMUNITY): Payer: BC Managed Care – PPO

## 2019-10-17 DIAGNOSIS — Z5321 Procedure and treatment not carried out due to patient leaving prior to being seen by health care provider: Secondary | ICD-10-CM | POA: Insufficient documentation

## 2019-10-17 DIAGNOSIS — R079 Chest pain, unspecified: Secondary | ICD-10-CM | POA: Diagnosis not present

## 2019-10-17 DIAGNOSIS — R0789 Other chest pain: Secondary | ICD-10-CM | POA: Diagnosis not present

## 2019-10-17 HISTORY — DX: Heart failure, unspecified: I50.9

## 2019-10-17 LAB — CBC
HCT: 43.2 % (ref 39.0–52.0)
Hemoglobin: 14.4 g/dL (ref 13.0–17.0)
MCH: 30.1 pg (ref 26.0–34.0)
MCHC: 33.3 g/dL (ref 30.0–36.0)
MCV: 90.2 fL (ref 80.0–100.0)
Platelets: 363 10*3/uL (ref 150–400)
RBC: 4.79 MIL/uL (ref 4.22–5.81)
RDW: 13.2 % (ref 11.5–15.5)
WBC: 9.4 10*3/uL (ref 4.0–10.5)
nRBC: 0 % (ref 0.0–0.2)

## 2019-10-17 LAB — BASIC METABOLIC PANEL
Anion gap: 11 (ref 5–15)
BUN: 12 mg/dL (ref 6–20)
CO2: 24 mmol/L (ref 22–32)
Calcium: 8.8 mg/dL — ABNORMAL LOW (ref 8.9–10.3)
Chloride: 104 mmol/L (ref 98–111)
Creatinine, Ser: 0.95 mg/dL (ref 0.61–1.24)
GFR calc Af Amer: >60 mL/min (ref >60)
GFR calc non Af Amer: >60 mL/min (ref >60)
Glucose, Bld: 101 mg/dL — ABNORMAL HIGH (ref 70–99)
Potassium: 3.5 mmol/L (ref 3.5–5.1)
Sodium: 139 mmol/L (ref 135–145)

## 2019-10-17 LAB — TROPONIN I (HIGH SENSITIVITY): Troponin I (High Sensitivity): 61 ng/L — ABNORMAL HIGH (ref <18)

## 2019-10-17 MED ORDER — SODIUM CHLORIDE 0.9% FLUSH: 3.0000 mL | Freq: Once | INTRAVENOUS | Status: DC

## 2019-10-17 NOTE — ED Triage Notes (Signed)
Pt arrives via pov for c/o L sided cp since last Monday. Denies sob or leg swelling. Hx of chf and htn. Denies any recent sick contacts, fever, chills or new cough.

## 2019-10-17 NOTE — ED Notes (Signed)
Pt called x2 for vitals recheck, no answer.

## 2019-10-17 NOTE — ED Notes (Signed)
Pt did not answer for vitals recheck 

## 2019-10-23 NOTE — Progress Notes (Signed)
Cardiology Clinic Note   Patient Name: Richard Hale Date of Encounter: 10/23/2019  Primary Care Provider:  Cain Saupe, MD Primary Cardiologist:  Rollene Rotunda, MD  Patient Profile    Richard Hale is a 34 y.o. male with a past medical history of chronic systolic heart failure, obstructive sleep apnea on CPAP, hypertension, and medication noncompliance.  Past Medical History    Past Medical History:  Diagnosis Date  . CHF (congestive heart failure) (HCC)   . Hypertension   . Morbid obesity (HCC)   . Obstructive sleep apnea    CPAP  . Tobacco use 04/06/2019   Past Surgical History:  Procedure Laterality Date  . NOSE SURGERY      Allergies  Allergies  Allergen Reactions  . Lisinopril Cough    History of Present Illness    Mr. Richard Hale has a past medical history of chronic systolic heart failure with an EF of 45%, OSA not on CPAP, hypertension, and medication noncompliance.  He was hospitalized in March with hypertensive urgency due to running out of his medication.  He was hospitalized again in June with hypertension and found to have a reduced EF.  He was last seen by Dr. Antoine Poche in clinic on 08/29/2019.  At that time he reported chest discomfort that was constant.  He reported having this chest discomfort in the hospital with a high-sensitivity troponin that was normal.  Chest pressure was felt to be atypical.  Hochrein suspected uncontrolled hypertension at that time.  That visit he was maintained on 10 mg of amlodipine, 25 mg HCTZ, 300 mg irbesartan, and labetalol twice daily, 50 mg spironolactone.  He was given 0.1 mg clonidine in the office.  He is was seen via virtual platform on 09/27/2019 and stated that his chest pain was better.  This was chronic in nature and labile.  He noticed that it was more intense on some days and less intense on other days, describing it as sharp pain under his left breast.  He was not able to bring on chest pain with physical  activity and nothing relieved his chest pain.  He remaind very physically active at work and walked for his entire shift.  He was doing  walks outside 1-2 times per week but due to Covid has discontinued his walking outside of work.  He had a exercise bike at his home that he planned to start using 15 minutes/day.  He continued to smoke 5 to 6 cigarettes/day which is down from 10-15 daily 1 month ago.  I sent him to the pharmacy for blood pressure evaluation, sent a blood pressure cuff to his home, gave him the salty 6 sheet, encouraged him to be more physically active, and sent him smoking cessation information.  He presented to see [facility] clinic pharmacist on 10/03/2019.  At that time he was blood pressure was 180/120.  He was continued on labetalol, irbesartan, HCTZ, amlodipine, and spironolactone.  His medication dosing was changed to twice daily.  He was instructed to take his labetalol, irbesartan, and HCTZ around 1 AM at work and take his second dose of labetalol with amlodipine and spironolactone in the mornings after work.  He presents to the clinic today and states he feels well. He has been exercising daily after work. 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  He states he is working on eating a lower sodium diet.  He also stated that he went to the emergency department  on 10/17/2019 with complaints of left-sided chest discomfort.  He waited in the emergency department but decided to leave because pain went away.  He has had no recurrent episodes of chest pain.  His blood pressure is better controlled today but still not at goal.  I will increase his labetalol and have him follow-up with [facility].  He denies  shortness of breath, lower extremity edema, fatigue, palpitations, melena, hematuria, hemoptysis, diaphoresis, weakness, presyncope, syncope, orthopnea, and PND.  Home Medications    Prior to Admission medications   Medication Sig Start Date End Date  Taking? Authorizing Provider  amLODipine (NORVASC) 10 MG tablet Take 1 tablet by mouth once daily 10/12/19   Fulp, Cammie, MD  hydrochlorothiazide (HYDRODIURIL) 25 MG tablet TAKE 1 TABLET BY MOUTH IN THE MORNING 10/12/19   Fulp, Cammie, MD  irbesartan (AVAPRO) 300 MG tablet Take 1 tablet by mouth once daily 10/12/19   Fulp, Cammie, MD  labetalol (NORMODYNE) 100 MG tablet Take 1 tablet (100 mg total) by mouth 2 (two) times daily. 10/16/19   Fulp, Cammie, MD  pantoprazole (PROTONIX) 40 MG tablet Take 1 tablet (40 mg total) by mouth 2 (two) times daily. To reduce stomach acid 08/16/19   Fulp, Cammie, MD  spironolactone (ALDACTONE) 50 MG tablet Take 1 tablet (50 mg total) by mouth daily. 10/16/19 01/14/20  Cain Saupe, MD    Family History    Family History  Problem Relation Age of Onset  . Hypertension Mother   . Heart failure Mother   . Diabetes Maternal Grandmother   . CAD Father 78       Heart attack and Stroke   He indicated that his mother is alive. He indicated that his father is deceased. He indicated that the status of his maternal grandmother is unknown.  Social History    Social History   Socioeconomic History  . Marital status: Single    Spouse name: Not on file  . Number of children: Not on file  . Years of education: Not on file  . Highest education level: Not on file  Occupational History  . Not on file  Tobacco Use  . Smoking status: Current Every Day Smoker    Packs/day: 0.25    Types: Cigarettes  . Smokeless tobacco: Never Used  . Tobacco comment: 5 cigs a day  Substance and Sexual Activity  . Alcohol use: Yes    Comment: occ  . Drug use: No  . Sexual activity: Yes  Other Topics Concern  . Not on file  Social History Narrative   Lives with girlfriend and her kids.  He has three children.  Works at a Radio broadcast assistant.     Social Determinants of Health   Financial Resource Strain:   . Difficulty of Paying Living Expenses: Not on file  Food Insecurity:   .  Worried About Programme researcher, broadcasting/film/video in the Last Year: Not on file  . Ran Out of Food in the Last Year: Not on file  Transportation Needs:   . Lack of Transportation (Medical): Not on file  . Lack of Transportation (Non-Medical): Not on file  Physical Activity:   . Days of Exercise per Week: Not on file  . Minutes of Exercise per Session: Not on file  Stress:   . Feeling of Stress : Not on file  Social Connections:   . Frequency of Communication with Friends and Family: Not on file  . Frequency of Social Gatherings with Friends and Family: Not  on file  . Attends Religious Services: Not on file  . Active Member of Clubs or Organizations: Not on file  . Attends Archivist Meetings: Not on file  . Marital Status: Not on file  Intimate Partner Violence:   . Fear of Current or Ex-Partner: Not on file  . Emotionally Abused: Not on file  . Physically Abused: Not on file  . Sexually Abused: Not on file     Review of Systems    General:  No chills, fever, night sweats or weight changes.  Cardiovascular:  No chest pain, dyspnea on exertion, edema, orthopnea, palpitations, paroxysmal nocturnal dyspnea. Dermatological: No rash, lesions/masses Respiratory: No cough, dyspnea Urologic: No hematuria, dysuria Abdominal:   No nausea, vomiting, diarrhea, bright red blood per rectum, melena, or hematemesis Neurologic:  No visual changes, wkns, changes in mental status. All other systems reviewed and are otherwise negative except as noted above.  Physical Exam    VS:  There were no vitals taken for this visit. , BMI There is no height or weight on file to calculate BMI. GEN: Well nourished, well developed, in no acute distress. HEENT: normal. Neck: Supple, no JVD, carotid bruits, or masses. Cardiac: RRR, no murmurs, rubs, or gallops. No clubbing, cyanosis, edema.  Radials/DP/PT 2+ and equal bilaterally.  Respiratory:  Respirations regular and unlabored, clear to auscultation  bilaterally. GI: Soft, nontender, nondistended, BS + x 4. MS: no deformity or atrophy. Skin: warm and dry, no rash. Neuro:  Strength and sensation are intact. Psych: Normal affect.  Accessory Clinical Findings    ECG personally reviewed by me today-none today  Echocardiogram 04/07/2019 IMPRESSIONS   1. The left ventricle has a visually estimated ejection fraction of 45%. The cavity size was mildly dilated. There is moderately increased left ventricular wall thickness. Left ventricular diastolic Doppler parameters are consistent with  pseudonormalization. Left ventrical global hypokinesis without regional wall motion abnormalities. 2. The right ventricle has normal systolic function. The cavity was normal. There is no increase in right ventricular wall thickness. 3. No evidence of mitral valve stenosis. No mitral regurgitation. 4. The aortic valve is tricuspid. No stenosis of the aortic valve. 5. The aortic root is normal in size and structure. No complete TR doppler jet so unable to estimate PA systolic pressure.  Assessment & Plan   1.  Chest pain-none  today.  Chronic in nature.   Atypical, high-sensitivity troponin was negative while having chest discomfort. Continue to monitor  Hypertension- blood pressure today 168/116.  Has obtained a home blood pressure cuff.  Better controlled at home 140s-150s over 80s. Continue 10 mg of amlodipine Continue 25 mg HCTZ Continue 300 mg irbesartan Increase labetalol 200 mg twice daily Continue 50 mg spironolactone daily Heart healthy low-sodium diet-salty 6 given Increase physical activity as tolerated Continue weight loss Patient to present to pharmacy in 1 month for blood pressure evaluation   Cardiomyopathy/Chronic systolic heart failure-no increased activity intolerance or dyspnea on exertion today, EF 45%.Suspect this is related to his uncontrolled hypertension Continue 10 mg of amlodipine Continue 25 mg HCTZ Continue 300  mg irbesartan Continue labetalol 200 mg twice daily Continue 50 mg spironolactone daily Heart healthy low-sodium diet-salty 6 given Increase physical activity as tolerated Continue weight loss  Morbid obesity-weight today 380 pounds fully clothed.  Encouraged wearing the same time each day after voiding. Heart healthy low-sodium diet Increase physical activity Continue weight loss   Tobacco use-currently using 4-5 cigarettes/day which is down from 10 to 15  cigarettes 1 month ago. Previous reduction in tobacco use Smoking cessation handout given   OSA-compliant with CPAP Continue weight loss Continue CPAP use  Disposition: Follow-up with [facility] in one month.  Thomasene Ripple. Arriona Prest NP-C      [facility] [address] Office [phone] Fax [phone]

## 2019-10-24 ENCOUNTER — Telehealth: Payer: Self-pay | Admitting: *Deleted

## 2019-10-24 ENCOUNTER — Encounter: Payer: Self-pay | Admitting: General Practice

## 2019-10-24 ENCOUNTER — Other Ambulatory Visit: Payer: Self-pay

## 2019-10-24 ENCOUNTER — Ambulatory Visit (INDEPENDENT_AMBULATORY_CARE_PROVIDER_SITE_OTHER): Payer: BC Managed Care – PPO | Admitting: General Practice

## 2019-10-24 VITALS — BP 168/116 | HR 90 | Ht 74.0 in | Wt 380.0 lb

## 2019-10-24 DIAGNOSIS — G4733 Obstructive sleep apnea (adult) (pediatric): Secondary | ICD-10-CM | POA: Diagnosis not present

## 2019-10-24 DIAGNOSIS — R079 Chest pain, unspecified: Secondary | ICD-10-CM

## 2019-10-24 DIAGNOSIS — I429 Cardiomyopathy, unspecified: Secondary | ICD-10-CM | POA: Diagnosis not present

## 2019-10-24 DIAGNOSIS — Z720 Tobacco use: Secondary | ICD-10-CM

## 2019-10-24 DIAGNOSIS — I10 Essential (primary) hypertension: Secondary | ICD-10-CM

## 2019-10-24 MED ORDER — LABETALOL HCL 200 MG PO TABS: 200.0000 mg | ORAL_TABLET | Freq: Two times a day (BID) | ORAL | 3 refills | Status: DC

## 2019-10-24 NOTE — Patient Instructions (Signed)
Medication Instructions:  INCREASE LABETALOL 200MG  TWICE DAILY If you need a refill on your cardiac medications before your next appointment, please call your pharmacy.  Special Instructions: PLEASE FOLLOW HEART HEALTHY DIET  PLEASE MAINTAIN PHYSICAL ACTIVITY  PLEASE READ AND FOLLOW SALTY 6 ATTACHED  Reduce your risk of getting COVID-19 With your heart disease it is especially important for people at increased risk of severe illness from COVID-19, and those who live with them, to protect themselves from getting COVID-19. The best way to protect yourself and to help reduce the spread of the virus that causes COVID-19 is to: Marland Kitchen Limit your interactions with other people as much as possible. . Take precautions to prevent getting COVID-19 when you do interact with others. If you start feeling sick and think you may have COVID-19, get in touch with your healthcare provider within 24 hours.  Follow-Up: IN 1 MONTH Virtual Visit  [facility] FOR HYPERTENSION.    At [facility], you and your health needs are our priority.  As part of our continuing mission to provide you with exceptional heart care, we have created designated Provider Care Teams.  These Care Teams include your primary Cardiologist (physician) and Advanced Practice Providers (APPs -  Physician Assistants and Nurse Practitioners) who all work together to provide you with the care you need, when you need it.  Thank you for choosing [facility]!!        Fat and Cholesterol Restricted Eating Plan Getting too much fat and cholesterol in your diet may cause health problems. Choosing the right foods helps keep your fat and cholesterol at normal levels. This can keep you from getting certain diseases.  What are tips for following this plan?  Meal planning  At meals, divide your plate into four equal parts: ? Fill one-half of your plate with vegetables and green salads. ? Fill one-fourth of your plate with whole  grains. ? Fill one-fourth of your plate with low-fat (lean) protein foods.  Eat fish that is high in omega-3 fats at least two times a week. This includes mackerel, tuna, sardines, and salmon.  Eat foods that are high in fiber, such as whole grains, beans, apples, broccoli, carrots, peas, and barley. General tips   Work with your doctor to lose weight if you need to.  Avoid: ? Foods with added sugar. ? Fried foods. ? Foods with partially hydrogenated oils.  Limit alcohol intake to no more than 1 drink a day for nonpregnant women and 2 drinks a day for men. One drink equals 12 oz of beer, 5 oz of wine, or 1 oz of hard liquor. Reading food labels  Check food labels for: ? Trans fats. ? Partially hydrogenated oils. ? Saturated fat (g) in each serving. ? Cholesterol (mg) in each serving. ? Fiber (g) in each serving.  Choose foods with healthy fats, such as: ? Monounsaturated fats. ? Polyunsaturated fats. ? Omega-3 fats.  Choose grain products that have whole grains. Look for the word "whole" as the first word in the ingredient list. Cooking  Cook foods using low-fat methods. These include baking, boiling, grilling, and broiling.  Eat more home-cooked foods. Eat at restaurants and buffets less often.  Avoid cooking using saturated fats, such as butter, cream, palm oil, palm kernel oil, and coconut oil. Recommended foods  Fruits  All fresh, canned (in natural juice), or frozen fruits. Vegetables  Fresh or frozen vegetables (raw, steamed, roasted, or grilled). Green salads. Grains  Whole grains, such as  whole wheat or whole grain breads, crackers, cereals, and pasta. Unsweetened oatmeal, bulgur, barley, quinoa, or brown rice. Corn or whole wheat flour tortillas. Meats and other protein foods  Ground beef (85% or leaner), grass-fed beef, or beef trimmed of fat. Skinless chicken or [place]. Ground chicken or [place]. Pork trimmed of fat. All fish and seafood. Egg whites.  Dried beans, peas, or lentils. Unsalted nuts or seeds. Unsalted canned beans. Nut butters without added sugar or oil. Dairy  Low-fat or nonfat dairy products, such as skim or 1% milk, 2% or reduced-fat cheeses, low-fat and fat-free ricotta or cottage cheese, or plain low-fat and nonfat yogurt. Fats and oils  Tub margarine without trans fats. Light or reduced-fat mayonnaise and salad dressings. Avocado. Olive, canola, sesame, or safflower oils. The items listed above may not be a complete list of foods and beverages you can eat. Contact a dietitian for more information. Foods to avoid Fruits  Canned fruit in heavy syrup. Fruit in cream or butter sauce. Fried fruit. Vegetables  Vegetables cooked in cheese, cream, or butter sauce. Fried vegetables. Grains  Cude bread. Barbian pasta. [place]addock rice. Cornbread. Bagels, pastries, and croissants. Crackers and snack foods that contain trans fat and hydrogenated oils. Meats and other protein foods  Fatty cuts of meat. Ribs, chicken wings, bacon, sausage, bologna, salami, chitterlings, fatback, hot dogs, bratwurst, and packaged lunch meats. Liver and organ meats. Whole eggs and egg yolks. Chicken and [place] with skin. Fried meat. Dairy  Whole or 2% milk, cream, half-and-half, and cream cheese. Whole milk cheeses. Whole-fat or sweetened yogurt. Full-fat cheeses. Nondairy creamers and whipped toppings. Processed cheese, cheese spreads, and cheese curds. Beverages  Alcohol. Sugar-sweetened drinks such as sodas, lemonade, and fruit drinks. Fats and oils  Butter, stick margarine, lard, shortening, ghee, or bacon fat. Coconut, palm kernel, and palm oils. Sweets and desserts  Corn syrup, sugars, honey, and molasses. Candy. Jam and jelly. Syrup. Sweetened cereals. Cookies, pies, cakes, donuts, muffins, and ice cream. The items listed above may not be a complete list of foods and beverages you should avoid. Contact a dietitian for more  information. Summary  Choosing the right foods helps keep your fat and cholesterol at normal levels. This can keep you from getting certain diseases.  At meals, fill one-half of your plate with vegetables and green salads.  Eat high-fiber foods, like whole grains, beans, apples, carrots, peas, and barley.  Limit added sugar, saturated fats, alcohol, and fried foods. This information is not intended to replace advice given to you by your health care provider. Make sure you discuss any questions you have with your health care provider. Document Revised: 06/01/2018 Document Reviewed: 06/15/2017 Elsevier Patient Education  2020 [employer].

## 2019-10-24 NOTE — Telephone Encounter (Signed)
Patient was in the office 10/24/19 and was asked to schedule a 1 month bp check with [facility]---patient already had an appointment on 11/27/19 with Edd Fabian. NP---told patient to keep that appointment instead of scheduling with [facility]

## 2019-10-25 ENCOUNTER — Ambulatory Visit: Payer: BC Managed Care – PPO | Admitting: Gastroenterology

## 2019-11-24 NOTE — Progress Notes (Deleted)
Cardiology Clinic Note   Patient Name: Richard Hale Date of Encounter: 11/24/2019  Primary Care Provider:  Cain Saupe, MD Primary Cardiologist:  Rollene Rotunda, MD  Patient Profile    Richard Hale Whiteis a 34 y.o.male with a past medical history of chronic systolic heart failure, obstructive sleep apnea on CPAP, hypertension, and medication noncompliance.  Past Medical History    Past Medical History:  Diagnosis Date  . CHF (congestive heart failure) (HCC)   . Hypertension   . Morbid obesity (HCC)   . Obstructive sleep apnea    CPAP  . Tobacco use 04/06/2019   Past Surgical History:  Procedure Laterality Date  . NOSE SURGERY      Allergies  Allergies  Allergen Reactions  . Lisinopril Cough    History of Present Illness    Richard Hale has a past medical history of chronic systolic heart failure with an EF of 45%, OSA not on CPAP, hypertension, and medication noncompliance. He was hospitalized in March with hypertensive urgency due to running out of his medication. He was hospitalized again in June with hypertension and found to have a reduced EF. He was last seen by Dr. Antoine Poche in clinic on 08/29/2019. At that time he reported chest discomfort that was constant. He reported having this chest discomfort in the hospital with a high-sensitivity troponin that was normal. Chest pressure was felt to be atypical. Hochrein suspected uncontrolled hypertension at that time. That visit he was maintained on 10 mg of amlodipine, 25 mg HCTZ, 300 mg irbesartan, and labetalol twice daily, 50 mg spironolactone.He was given 0.1 mg clonidine in the office.  He is was seen via virtual platform on 09/27/2019 and stated that his chest pain was better. This was chronic in nature and labile. He noticed that it was more intense on some days and less intense on other days, describing it as sharp pain under his left breast. He was not able to bring on chest pain with physical  activity and nothing relieved his chest pain.He remaind very physically active at work and walked for his entire shift. He was doing  walks outside 1-2 times per week but due to Covid has discontinued his walking outside of work. He had a exercise bike at his home that he planned to start using 15 minutes/day. He continued to smoke 5 to 6 cigarettes/day which is down from 10-15 daily 1 month ago. I sent him to the pharmacy for blood pressure evaluation, sent a blood pressure cuff to his home, gave him the salty 6 sheet, encouraged him to be more physically active, and sent him smoking cessation information.  He presented to see [facility] clinic pharmacist on 10/03/2019.  At that time he was blood pressure was 180/120.  He was continued on labetalol, irbesartan, HCTZ, amlodipine, and spironolactone.  His medication dosing was changed to twice daily.  He was instructed to take his labetalol, irbesartan, and HCTZ around 1 AM at work and take his second dose of labetalol with amlodipine and spironolactone in the mornings after work.  He presented to the clinic 10/24/2019 and stated he felt well. He had been exercising daily after work. 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  He stated he was working on eating a lower sodium diet.  He also stated that he went to the emergency department on 10/17/2019 with complaints of left-sided chest discomfort.  He waited in the emergency department but decided to leave because pain  went away.  He  had no recurrent episodes of chest pain.  His blood pressure was better controlled  but still not at goal.  I will increased his labetalol and had him follow-up with [facility].  He presents the clinic today and states***  Hedenies shortness of breath, lower extremity edema, fatigue, palpitations, melena, hematuria, hemoptysis, diaphoresis, weakness, presyncope, syncope, orthopnea, and PND.   Home Medications    Prior to Admission  medications   Medication Sig Start Date End Date Taking? Authorizing Provider  amLODipine (NORVASC) 10 MG tablet Take 1 tablet by mouth once daily 10/12/19   Fulp, Cammie, MD  hydrochlorothiazide (HYDRODIURIL) 25 MG tablet TAKE 1 TABLET BY MOUTH IN THE MORNING 10/12/19   Fulp, Cammie, MD  irbesartan (AVAPRO) 300 MG tablet Take 1 tablet by mouth once daily 10/12/19   Fulp, Cammie, MD  labetalol (NORMODYNE) 200 MG tablet Take 1 tablet (200 mg total) by mouth 2 (two) times daily. 10/24/19   Deberah Pelton, NP  pantoprazole (PROTONIX) 40 MG tablet Take 1 tablet (40 mg total) by mouth 2 (two) times daily. To reduce stomach acid 08/16/19   Fulp, Cammie, MD  spironolactone (ALDACTONE) 50 MG tablet Take 1 tablet (50 mg total) by mouth daily. 10/16/19 01/14/20  Antony Blackbird, MD    Family History    Family History  Problem Relation Age of Onset  . Hypertension Mother   . Heart failure Mother   . Diabetes Maternal Grandmother   . CAD Father 37       Heart attack and Stroke   He indicated that his mother is alive. He indicated that his father is deceased. He indicated that the status of his maternal grandmother is unknown.  Social History    Social History   Socioeconomic History  . Marital status: Single    Spouse name: Not on file  . Number of children: Not on file  . Years of education: Not on file  . Highest education level: Not on file  Occupational History  . Not on file  Tobacco Use  . Smoking status: Current Every Day Smoker    Packs/day: 0.25    Types: Cigarettes  . Smokeless tobacco: Never Used  . Tobacco comment: 5 cigs a day  Substance and Sexual Activity  . Alcohol use: Yes    Comment: occ  . Drug use: No  . Sexual activity: Yes  Other Topics Concern  . Not on file  Social History Narrative   Lives with girlfriend and her kids.  He has three children.  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     Social Determinants of Health   Financial Resource Strain:   . Difficulty of Paying  Living Expenses: Not on file  Food Insecurity:   . Worried About Charity fundraiser in the Last Year: Not on file  . Ran Out of Food in the Last Year: Not on file  Transportation Needs:   . Lack of Transportation (Medical): Not on file  . Lack of Transportation (Non-Medical): Not on file  Physical Activity:   . Days of Exercise per Week: Not on file  . Minutes of Exercise per Session: Not on file  Stress:   . Feeling of Stress : Not on file  Social Connections:   . Frequency of Communication with Friends and Family: Not on file  . Frequency of Social Gatherings with Friends and Family: Not on file  . Attends Religious Services: Not on file  . Active  Member of Clubs or Organizations: Not on file  . Attends Banker Meetings: Not on file  . Marital Status: Not on file  Intimate Partner Violence:   . Fear of Current or Ex-Partner: Not on file  . Emotionally Abused: Not on file  . Physically Abused: Not on file  . Sexually Abused: Not on file     Review of Systems    General:  No chills, fever, night sweats or weight changes.  Cardiovascular:  No chest pain, dyspnea on exertion, edema, orthopnea, palpitations, paroxysmal nocturnal dyspnea. Dermatological: No rash, lesions/masses Respiratory: No cough, dyspnea Urologic: No hematuria, dysuria Abdominal:   No nausea, vomiting, diarrhea, bright red blood per rectum, melena, or hematemesis Neurologic:  No visual changes, wkns, changes in mental status. All other systems reviewed and are otherwise negative except as noted above.  Physical Exam    VS:  There were no vitals taken for this visit. , BMI There is no height or weight on file to calculate BMI. GEN: Well nourished, well developed, in no acute distress. HEENT: normal. Neck: Supple, no JVD, carotid bruits, or masses. Cardiac: RRR, no murmurs, rubs, or gallops. No clubbing, cyanosis, edema.  Radials/DP/PT 2+ and equal bilaterally.  Respiratory:  Respirations  regular and unlabored, clear to auscultation bilaterally. GI: Soft, nontender, nondistended, BS + x 4. MS: no deformity or atrophy. Skin: warm and dry, no rash. Neuro:  Strength and sensation are intact. Psych: Normal affect.  Accessory Clinical Findings    ECG personally reviewed by me today- *** - No acute changes  Echocardiogram 04/07/2019 IMPRESSIONS   1. The left ventricle has a visually estimated ejection fraction of 45%. The cavity size was mildly dilated. There is moderately increased left ventricular wall thickness. Left ventricular diastolic Doppler parameters are consistent with  pseudonormalization. Left ventrical global hypokinesis without regional wall motion abnormalities. 2. The right ventricle has normal systolic function. The cavity was normal. There is no increase in right ventricular wall thickness. 3. No evidence of mitral valve stenosis. No mitral regurgitation. 4. The aortic valve is tricuspid. No stenosis of the aortic valve. 5. The aortic root is normal in size and structure. No complete TR doppler jet so unable to estimate PA systolic pressure.  Assessment & Plan   1.Hypertension- blood pressure today ***  Has obtained a home blood pressure cuff.  Better controlled at home ***140s-150s over 80s. Continue10 mg of amlodipine Continue25 mg HCTZ Continue300 mg irbesartan Increaselabetalol 200 mgtwice daily Continue50 mg spironolactonedaily Heart healthy low-sodium diet-salty 6 given Increase physical activity as tolerated Continue weight loss Patient to present to pharmacy in 1 month for blood pressure evaluation  Chest pain-none  today***.Chronic in nature. Atypical, high-sensitivity troponin was negativewhilehaving chest discomfort. Continue to monitor  Cardiomyopathy/Chronic systolic heart failure-no increased activity intolerance or dyspnea on exertion today, EF 45%.Suspect this is related to his uncontrolled  hypertension Continue10 mg of amlodipine Continue25 mg HCTZ Continue300 mg irbesartan Continuelabetalol 200 mgtwice daily Continue50 mg spironolactonedaily Heart healthy low-sodium diet-salty 6 given Increase physical activity as tolerated Continue weight loss  Morbid obesity-weight today 380*** pounds fully clothed. Encouraged wearing the same time each day after voiding. Heart healthy low-sodium diet Increase physical activity Continue weight loss  Tobacco use-currently using 4-5 cigarettes/day which is down from 10 to 15cigarettes 1 month ago. Previous reduction in tobacco use Smoking cessation handout given   OSA-compliant with CPAP Continue weight loss Continue CPAP use  Disposition: Follow-up with Dr. Antoine Poche in 3 months.  Thomasene Ripple. Dashay Giesler NP-C      [facility] [address] Office [phone] Fax [phone]

## 2019-11-27 ENCOUNTER — Ambulatory Visit: Payer: BC Managed Care – PPO | Admitting: General Practice

## 2019-11-28 ENCOUNTER — Ambulatory Visit: Payer: BC Managed Care – PPO

## 2020-01-01 ENCOUNTER — Encounter: Payer: Self-pay | Admitting: General Practice

## 2020-01-05 ENCOUNTER — Other Ambulatory Visit: Payer: Self-pay | Admitting: Family Medicine

## 2020-01-05 DIAGNOSIS — I10 Essential (primary) hypertension: Secondary | ICD-10-CM

## 2020-05-15 ENCOUNTER — Other Ambulatory Visit: Payer: Self-pay | Admitting: Family Medicine

## 2020-05-15 DIAGNOSIS — I10 Essential (primary) hypertension: Secondary | ICD-10-CM

## 2020-05-18 ENCOUNTER — Ambulatory Visit (HOSPITAL_COMMUNITY)
Admission: EM | Admit: 2020-05-18 | Discharge: 2020-05-18 | Disposition: A | Discharge status: Home / Self Care | Payer: BC Managed Care – PPO | Attending: Emergency Medicine | Admitting: Emergency Medicine

## 2020-05-18 ENCOUNTER — Other Ambulatory Visit: Payer: Self-pay

## 2020-05-18 ENCOUNTER — Encounter (HOSPITAL_COMMUNITY): Payer: Self-pay | Admitting: Emergency Medicine

## 2020-05-18 DIAGNOSIS — M542 Cervicalgia: Secondary | ICD-10-CM | POA: Diagnosis not present

## 2020-05-18 DIAGNOSIS — I10 Essential (primary) hypertension: Secondary | ICD-10-CM | POA: Diagnosis not present

## 2020-05-18 LAB — BASIC METABOLIC PANEL
Anion gap: 13 (ref 5–15)
BUN: 11 mg/dL (ref 6–20)
CO2: 23 mmol/L (ref 22–32)
Calcium: 8.9 mg/dL (ref 8.9–10.3)
Chloride: 103 mmol/L (ref 98–111)
Creatinine, Ser: 0.86 mg/dL (ref 0.61–1.24)
GFR calc Af Amer: >60 mL/min (ref >60)
GFR calc non Af Amer: >60 mL/min (ref >60)
Glucose, Bld: 100 mg/dL — ABNORMAL HIGH (ref 70–99)
Potassium: 3.3 mmol/L — ABNORMAL LOW (ref 3.5–5.1)
Sodium: 139 mmol/L (ref 135–145)

## 2020-05-18 MED ORDER — LABETALOL HCL 200 MG PO TABS: 200.0000 mg | ORAL_TABLET | Freq: Two times a day (BID) | ORAL | 0 refills | Status: DC

## 2020-05-18 MED ORDER — CLONIDINE HCL 0.1 MG PO TABS
ORAL_TABLET | ORAL | Status: AC
  Filled 2020-05-18: qty 1

## 2020-05-18 MED ORDER — CYCLOBENZAPRINE HCL 5 MG PO TABS: 5.0000 mg | ORAL_TABLET | Freq: Every day | ORAL | 0 refills | Status: DC

## 2020-05-18 MED ORDER — CLONIDINE HCL 0.1 MG PO TABS
0.1000 mg | ORAL_TABLET | Freq: Once | ORAL | Status: AC
  Administered 2020-05-18: 0.1 mg via ORAL

## 2020-05-18 MED ORDER — HYDROCHLOROTHIAZIDE 25 MG PO TABS: 25.0000 mg | ORAL_TABLET | Freq: Every morning | ORAL | 0 refills | Status: DC

## 2020-05-18 MED ORDER — IRBESARTAN 300 MG PO TABS: 300.0000 mg | ORAL_TABLET | Freq: Every day | ORAL | 0 refills | Status: DC

## 2020-05-18 MED ORDER — SPIRONOLACTONE 50 MG PO TABS: 50.0000 mg | ORAL_TABLET | Freq: Every day | ORAL | 2 refills | Status: DC

## 2020-05-18 MED ORDER — AMLODIPINE BESYLATE 10 MG PO TABS: 10.0000 mg | ORAL_TABLET | Freq: Every day | ORAL | 0 refills | Status: DC

## 2020-05-18 NOTE — ED Triage Notes (Signed)
Ran out of blood pressure medicine on Monday 05/13/2020 Patient has appt with provider on 8/20 Patient has neck and , back of head and across shoulders are hurting.  This pain started last Monday, too Denies chest pain.   Has been using biofreeze and excedrin for pain and has good results, but pain returns when medicine wears off.

## 2020-05-18 NOTE — Discharge Instructions (Signed)
It is essential that you follow up with your PCP for additional management, recheck and follow up of your BP.  I have sent a 30 day supply of your medications.  Flexeril for your neck pain. May cause drowsiness. Please do not take if driving or drinking alcohol.   If develop worsening of headache, vision changes, chest pain, shortness of breath , or otherwise worsening please go to the ER.

## 2020-05-18 NOTE — ED Provider Notes (Signed)
[facility]    CSN: [id number] Arrival date & time: 05/18/20  1015      History   Chief Complaint Chief Complaint  Patient presents with  . Hypertension    HPI Richard Hale is a 34 y.o. male.   Richard Hale presents with complaints of upper back, shoulder and headache, as well as hypertension. He went to the chiropractor for his pain, but they wouldn't treat due to high blood pressure. This was this morning, bp of 204/ 140. Out of blood pressure medications, ran out 8/3. Medications were denied through mychart refill. No chest pain. Neck and shoulder pain improves with raising of the arm. Tightness sensation. Some headache which comes from back of neck. No vision changes. No persistent dizziness. No leg pain or swelling. No shortness of breath. No chest pain .   ROS per HPI, negative if not otherwise mentioned.      Past Medical History:  Diagnosis Date  . CHF (congestive heart failure) (HCC)   . Hypertension   . Morbid obesity (HCC)   . Obstructive sleep apnea    CPAP  . Tobacco use 04/06/2019    Patient Active Problem List   Diagnosis Date Noted  . Cardiomyopathy (HCC) 08/28/2019  . Chest pain of uncertain etiology 08/28/2019  . Acute coronary syndrome (HCC) 04/06/2019  . Tobacco use 04/06/2019  . Hypokalemia 04/06/2019  . Hypertensive urgency 12/14/2018  . Hypertension 09/05/2018  . Obstructive sleep apnea 09/05/2018  . Morbid obesity (HCC) 09/05/2018  . Prediabetes 09/05/2018    Past Surgical History:  Procedure Laterality Date  . NOSE SURGERY         Home Medications    Prior to Admission medications   Medication Sig Start Date End Date Taking? Authorizing Provider  amLODipine (NORVASC) 10 MG tablet Take 1 tablet (10 mg total) by mouth daily. Must have office visit for refills 05/18/20   Linus Mako B, NP  cyclobenzaprine (FLEXERIL) 5 MG tablet Take 1 tablet (5 mg total) by mouth at bedtime. 05/18/20   Georgetta Haber, NP    hydrochlorothiazide (HYDRODIURIL) 25 MG tablet Take 1 tablet (25 mg total) by mouth every morning. Must have office visit for refills 05/18/20   Linus Mako B, NP  irbesartan (AVAPRO) 300 MG tablet Take 1 tablet (300 mg total) by mouth daily. Must have office visit for refills 05/18/20   Linus Mako B, NP  labetalol (NORMODYNE) 200 MG tablet Take 1 tablet (200 mg total) by mouth 2 (two) times daily. 05/18/20   Georgetta Haber, NP  pantoprazole (PROTONIX) 40 MG tablet Take 1 tablet (40 mg total) by mouth 2 (two) times daily. To reduce stomach acid 08/16/19   Fulp, Cammie, MD  spironolactone (ALDACTONE) 50 MG tablet Take 1 tablet (50 mg total) by mouth daily. 05/18/20 08/16/20  Georgetta Haber, NP    Family History Family History  Problem Relation Age of Onset  . Hypertension Mother   . Heart failure Mother   . Diabetes Maternal Grandmother   . CAD Father 69       Heart attack and Stroke    Social History Social History   Tobacco Use  . Smoking status: Current Every Day Smoker    Packs/day: 0.25    Types: Cigarettes  . Smokeless tobacco: Never Used  . Tobacco comment: 5 cigs a day  Vaping Use  . Vaping Use: Never used  Substance Use Topics  . Alcohol use: Yes  Comment: occ  . Drug use: No     Allergies   Lisinopril   Review of Systems Review of Systems   Physical Exam Triage Vital Signs ED Triage Vitals  Enc Vitals Group     BP 05/18/20 1040 (!) 201/144     Pulse Rate 05/18/20 1040 76     Resp 05/18/20 1040 (!) 22     Temp 05/18/20 1040 98 F (36.7 C)     Temp Source 05/18/20 1040 Oral     SpO2 05/18/20 1040 97 %     Weight --      Height --      Head Circumference --      Peak Flow --      Pain Score 05/18/20 1037 7     Pain Loc --      Pain Edu? --      Excl. in GC? --    No data found.  Updated Vital Signs BP (!) 180/133 (BP Location: Left Arm)   Pulse 69   Temp 98 F (36.7 C) (Oral)   Resp (!) 22   SpO2 97%   Visual Acuity Right Eye  Distance:   Left Eye Distance:   Bilateral Distance:    Right Eye Near:   Left Eye Near:    Bilateral Near:     Physical Exam Constitutional:      Appearance: He is well-developed.  Cardiovascular:     Rate and Rhythm: Normal rate and regular rhythm.     Pulses: Normal pulses.     Heart sounds: Normal heart sounds.  Pulmonary:     Effort: Pulmonary effort is normal.  Musculoskeletal:     Cervical back: Tenderness present. No bony tenderness. No pain with movement.  Skin:    General: Skin is warm and dry.  Neurological:     Mental Status: He is alert and oriented to person, place, and time.      UC Treatments / Results  Labs (all labs ordered are listed, but only abnormal results are displayed) Labs Reviewed  BASIC METABOLIC PANEL - Abnormal; Notable for the following components:      Result Value   Potassium 3.3 (*)    Glucose, Bld 100 (*)    All other components within normal limits    EKG   Radiology No results found.  Procedures Procedures (including critical care time)  Medications Ordered in UC Medications  cloNIDine (CATAPRES) tablet 0.1 mg (0.1 mg Oral Given 05/18/20 1111)  cloNIDine (CATAPRES) tablet 0.1 mg (0.1 mg Oral Given 05/18/20 1149)    Initial Impression / Assessment and Plan / UC Course  I have reviewed the triage vital signs and the nursing notes.  Pertinent labs & imaging results that were available during my care of the patient were reviewed by me and considered in my medical decision making (see chart for details).     Mild improvement of BP with clonidine here today. ekg without acute findings as interpreted by me. Medications refilled with emphasis on follow up with PCP and/or cardiology as needed for medication management. Neck pain management discussed as well. Patient verbalized understanding and agreeable to plan.   Final Clinical Impressions(s) / UC Diagnoses   Final diagnoses:  Hypertension, unspecified type  Neck pain      Discharge Instructions     It is essential that you follow up with your PCP for additional management, recheck and follow up of your BP.  I have sent a 30 day  supply of your medications.  Flexeril for your neck pain. May cause drowsiness. Please do not take if driving or drinking alcohol.   If develop worsening of headache, vision changes, chest pain, shortness of breath , or otherwise worsening please go to the ER.     ED Prescriptions    Medication Sig Dispense Auth. Provider   amLODipine (NORVASC) 10 MG tablet Take 1 tablet (10 mg total) by mouth daily. Must have office visit for refills 30 tablet Darrly Loberg B, NP   hydrochlorothiazide (HYDRODIURIL) 25 MG tablet Take 1 tablet (25 mg total) by mouth every morning. Must have office visit for refills 30 tablet Daleysa Kristiansen B, NP   irbesartan (AVAPRO) 300 MG tablet Take 1 tablet (300 mg total) by mouth daily. Must have office visit for refills 30 tablet Linus Mako B, NP   spironolactone (ALDACTONE) 50 MG tablet Take 1 tablet (50 mg total) by mouth daily. 30 tablet Linus Mako B, NP   labetalol (NORMODYNE) 200 MG tablet Take 1 tablet (200 mg total) by mouth 2 (two) times daily. 30 tablet Linus Mako B, NP   cyclobenzaprine (FLEXERIL) 5 MG tablet Take 1 tablet (5 mg total) by mouth at bedtime. 15 tablet Georgetta Haber, NP     PDMP not reviewed this encounter.   Georgetta Haber, NP 05/19/20 2354

## 2020-06-13 ENCOUNTER — Ambulatory Visit: Payer: BC Managed Care – PPO | Admitting: Physician Assistant

## 2020-07-17 ENCOUNTER — Ambulatory Visit: Payer: BC Managed Care – PPO | Admitting: Physician Assistant

## 2020-09-28 ENCOUNTER — Ambulatory Visit (HOSPITAL_COMMUNITY)
Admission: EM | Admit: 2020-09-28 | Discharge: 2020-09-28 | Disposition: A | Discharge status: Home / Self Care | Payer: BC Managed Care – PPO

## 2020-09-28 ENCOUNTER — Other Ambulatory Visit: Payer: Self-pay

## 2020-09-28 ENCOUNTER — Emergency Department (HOSPITAL_COMMUNITY)
Admission: EM | Admit: 2020-09-28 | Discharge: 2020-09-28 | Discharge status: Absent without leave | Payer: BC Managed Care – PPO

## 2020-09-28 DIAGNOSIS — R0602 Shortness of breath: Secondary | ICD-10-CM | POA: Diagnosis not present

## 2020-09-28 DIAGNOSIS — R079 Chest pain, unspecified: Secondary | ICD-10-CM

## 2020-09-28 NOTE — ED Triage Notes (Signed)
Pt in with c/o CP and sob that started Wednesday. States he is having mid CP that does not radiate anywhere.  Denies nausea, vomiting, headache, or dizziness  Dr. Adela Glimpse into triage to assess pt status, advised pt that he should be seen in ED for further evaluation, pt understood plan of care

## 2020-09-28 NOTE — ED Notes (Signed)
Called for triage x3, no answer. 

## 2020-09-28 NOTE — ED Notes (Signed)
Patient is being discharged from the Urgent Care and sent to the Emergency Department via pov . Per Urbano Heir, MD patient is in need of higher level of care due to SOB, tachypnea, and active CP. Patient is aware and verbalizes understanding of plan of care.  Vitals:   09/28/20 1344 09/28/20 1353  BP: (!) 213/115   Pulse:    Resp:  (!) 33  SpO2:

## 2020-11-10 DIAGNOSIS — I10 Essential (primary) hypertension: Secondary | ICD-10-CM

## 2020-11-11 MED ORDER — HYDROCHLOROTHIAZIDE 25 MG PO TABS: 25.0000 mg | ORAL_TABLET | Freq: Every morning | ORAL | 0 refills | Status: DC

## 2020-11-11 MED ORDER — IRBESARTAN 300 MG PO TABS: 300.0000 mg | ORAL_TABLET | Freq: Every day | ORAL | 0 refills | Status: DC

## 2020-11-11 MED ORDER — LABETALOL HCL 200 MG PO TABS: 200.0000 mg | ORAL_TABLET | Freq: Two times a day (BID) | ORAL | 0 refills | Status: DC

## 2020-11-11 MED ORDER — AMLODIPINE BESYLATE 10 MG PO TABS: 10.0000 mg | ORAL_TABLET | Freq: Every day | ORAL | 0 refills | Status: DC

## 2020-11-11 MED ORDER — SPIRONOLACTONE 50 MG PO TABS: 50.0000 mg | ORAL_TABLET | Freq: Every day | ORAL | 2 refills | Status: DC

## 2020-11-18 ENCOUNTER — Ambulatory Visit (HOSPITAL_COMMUNITY)
Admission: EM | Admit: 2020-11-18 | Discharge: 2020-11-18 | Disposition: A | Discharge status: Home / Self Care | Payer: BC Managed Care – PPO | Attending: Emergency Medicine | Admitting: Emergency Medicine

## 2020-11-18 ENCOUNTER — Encounter (HOSPITAL_COMMUNITY): Payer: Self-pay | Admitting: *Deleted

## 2020-11-18 ENCOUNTER — Other Ambulatory Visit: Payer: Self-pay

## 2020-11-18 DIAGNOSIS — M5441 Lumbago with sciatica, right side: Secondary | ICD-10-CM | POA: Diagnosis not present

## 2020-11-18 DIAGNOSIS — M5442 Lumbago with sciatica, left side: Secondary | ICD-10-CM

## 2020-11-18 DIAGNOSIS — R030 Elevated blood-pressure reading, without diagnosis of hypertension: Secondary | ICD-10-CM

## 2020-11-18 MED ORDER — METHOCARBAMOL 500 MG PO TABS: 500.0000 mg | ORAL_TABLET | Freq: Two times a day (BID) | ORAL | 0 refills | Status: DC

## 2020-11-18 MED ORDER — IBUPROFEN 600 MG PO TABS: 600.0000 mg | ORAL_TABLET | Freq: Four times a day (QID) | ORAL | 0 refills | Status: DC | PRN

## 2020-11-18 NOTE — ED Triage Notes (Signed)
Pt reports back pain for 2 weeks with out fall.

## 2020-11-18 NOTE — Discharge Instructions (Addendum)
Take ibuprofen as needed for discomfort.  Take the muscle relaxer as needed for muscle spasm; Do not drive, operate machinery, or drink alcohol with this medication as it can cause drowsiness.   Follow up with your primary care provider or an orthopedist if your symptoms are not improving.    Your blood pressure is elevated today at 176/117.  Please have this rechecked by your primary care provider in 1-2 weeks.

## 2020-11-18 NOTE — ED Provider Notes (Signed)
[facility]    CSN: [id number] Arrival date & time: 11/18/20  5597      History   Chief Complaint Chief Complaint  Patient presents with  . Back Pain    HPI Richard Hale is a 35 y.o. male.   Patient presents with 2-week history of lower back pain which occasionally radiates down both legs.  No falls or injury.  He denies numbness, weakness, paresthesias, loss of control of his bowel/bladder, saddle anesthesia, wounds, bruising, redness, fever, or other symptoms.  Treatment attempted at home with Tylenol.  His medical history includes morbid obesity, hypertension, congestive heart failure, prediabetes, acute coronary syndrome, cardiomyopathy, tobacco use.  Patient states he has not taken his blood pressure medication today and he has an appointment with his PCP on 12/11/2020.  The history is provided by the patient and medical records.    Past Medical History:  Diagnosis Date  . CHF (congestive heart failure) (HCC)   . Hypertension   . Morbid obesity (HCC)   . Obstructive sleep apnea    CPAP  . Tobacco use 04/06/2019    Patient Active Problem List   Diagnosis Date Noted  . Cardiomyopathy (HCC) 08/28/2019  . Chest pain of uncertain etiology 08/28/2019  . Acute coronary syndrome (HCC) 04/06/2019  . Tobacco use 04/06/2019  . Hypokalemia 04/06/2019  . Hypertensive urgency 12/14/2018  . Hypertension 09/05/2018  . Obstructive sleep apnea 09/05/2018  . Morbid obesity (HCC) 09/05/2018  . Prediabetes 09/05/2018    Past Surgical History:  Procedure Laterality Date  . NOSE SURGERY         Home Medications    Prior to Admission medications   Medication Sig Start Date End Date Taking? Authorizing Provider  amLODipine (NORVASC) 10 MG tablet Take 1 tablet (10 mg total) by mouth daily. PT OVERDUE FOR OV PLEASE CALL FOR APPT 11/11/20  Yes Rollene Rotunda, MD  hydrochlorothiazide (HYDRODIURIL) 25 MG tablet Take 1 tablet (25 mg total) by mouth every morning. PT  OVERDUE FOR OV PLEASE CALL FOR APPT 11/11/20  Yes Rollene Rotunda, MD  ibuprofen (ADVIL) 600 MG tablet Take 1 tablet (600 mg total) by mouth every 6 (six) hours as needed. 11/18/20  Yes Mickie Bail, NP  irbesartan (AVAPRO) 300 MG tablet Take 1 tablet (300 mg total) by mouth daily. PT OVERDUE FOR OV PLEASE CALL FOR APPT 11/11/20  Yes Rollene Rotunda, MD  labetalol (NORMODYNE) 200 MG tablet Take 1 tablet (200 mg total) by mouth 2 (two) times daily. PT OVERDUE FOR OV PLEASE CALL FOR APPT 11/11/20  Yes Rollene Rotunda, MD  methocarbamol (ROBAXIN) 500 MG tablet Take 1 tablet (500 mg total) by mouth 2 (two) times daily. 11/18/20  Yes Mickie Bail, NP  pantoprazole (PROTONIX) 40 MG tablet Take 1 tablet (40 mg total) by mouth 2 (two) times daily. To reduce stomach acid 08/16/19  Yes Fulp, Cammie, MD  spironolactone (ALDACTONE) 50 MG tablet Take 1 tablet (50 mg total) by mouth daily. PT OVERDUE FOR OV PLEASE CALL FOR APPT 11/11/20 02/09/21 Yes Rollene Rotunda, MD    Family History Family History  Problem Relation Age of Onset  . Hypertension Mother   . Heart failure Mother   . Diabetes Maternal Grandmother   . CAD Father 4       Heart attack and Stroke    Social History Social History   Tobacco Use  . Smoking status: Current Every Day Smoker    Packs/day: 0.25    Types:  Cigarettes  . Smokeless tobacco: Never Used  . Tobacco comment: 5 cigs a day  Vaping Use  . Vaping Use: Never used  Substance Use Topics  . Alcohol use: Yes    Comment: occ  . Drug use: No     Allergies   Lisinopril   Review of Systems Review of Systems  Constitutional: Negative for chills and fever.  HENT: Negative for ear pain and sore throat.   Eyes: Negative for pain and visual disturbance.  Respiratory: Negative for cough and shortness of breath.   Cardiovascular: Negative for chest pain and palpitations.  Gastrointestinal: Negative for abdominal pain and vomiting.  Genitourinary: Negative for dysuria and  hematuria.  Musculoskeletal: Positive for back pain. Negative for arthralgias.  Skin: Negative for color change and rash.  Neurological: Negative for syncope, weakness and numbness.  All other systems reviewed and are negative.    Physical Exam Triage Vital Signs ED Triage Vitals  Enc Vitals Group     BP 11/18/20 0942 (!) 176/117     Pulse Rate 11/18/20 0942 87     Resp 11/18/20 0942 18     Temp 11/18/20 0942 98.1 F (36.7 C)     Temp Source 11/18/20 0942 Oral     SpO2 11/18/20 0942 98 %     Weight --      Height --      Head Circumference --      Peak Flow --      Pain Score 11/18/20 0953 (S) 0     Pain Loc --      Pain Edu? --      Excl. in GC? --    No data found.  Updated Vital Signs BP (!) 176/117 (BP Location: Right Arm)   Pulse 87   Temp 98.1 F (36.7 C) (Oral)   Resp 18   SpO2 98%   Visual Acuity Right Eye Distance:   Left Eye Distance:   Bilateral Distance:    Right Eye Near:   Left Eye Near:    Bilateral Near:     Physical Exam Vitals and nursing note reviewed.  Constitutional:      General: He is not in acute distress.    Appearance: He is well-developed and well-nourished. He is obese.  HENT:     Head: Normocephalic and atraumatic.     Mouth/Throat:     Mouth: Mucous membranes are moist.  Eyes:     Conjunctiva/sclera: Conjunctivae normal.  Cardiovascular:     Rate and Rhythm: Normal rate and regular rhythm.     Heart sounds: Normal heart sounds.  Pulmonary:     Effort: Pulmonary effort is normal. No respiratory distress.     Breath sounds: Normal breath sounds.  Abdominal:     Palpations: Abdomen is soft.     Tenderness: There is no abdominal tenderness.  Musculoskeletal:        General: No swelling, tenderness, deformity, signs of injury or edema. Normal range of motion.     Cervical back: Neck supple.  Skin:    General: Skin is warm and dry.     Findings: No bruising, erythema, lesion or rash.  Neurological:     General: No focal  deficit present.     Mental Status: He is alert and oriented to person, place, and time.     Sensory: No sensory deficit.     Motor: No weakness.     Gait: Gait normal.     Comments: Negative straight  leg raise.  Psychiatric:        Mood and Affect: Mood and affect and mood normal.        Behavior: Behavior normal.      UC Treatments / Results  Labs (all labs ordered are listed, but only abnormal results are displayed) Labs Reviewed - No data to display  EKG   Radiology No results found.  Procedures Procedures (including critical care time)  Medications Ordered in UC Medications - No data to display  Initial Impression / Assessment and Plan / UC Course  I have reviewed the triage vital signs and the nursing notes.  Pertinent labs & imaging results that were available during my care of the patient were reviewed by me and considered in my medical decision making (see chart for details).   Low back pain with sciatica.  Elevated blood pressure reading.  Treating with ibuprofen and Robaxin.  Precautions for drowsiness with Robaxin discussed.  Instructed patient to follow-up with his PCP or an orthopedist if his symptoms are not improving.  Discussed that his blood pressure is elevated today; he states he has not taken his blood pressure medications this morning.  Instructed him to follow-up with his PCP in 1 to 2 weeks for recheck of his blood pressure.  He agrees to plan of care.   Final Clinical Impressions(s) / UC Diagnoses   Final diagnoses:  Acute bilateral low back pain with bilateral sciatica  Elevated blood pressure reading     Discharge Instructions     Take ibuprofen as needed for discomfort.  Take the muscle relaxer as needed for muscle spasm; Do not drive, operate machinery, or drink alcohol with this medication as it can cause drowsiness.   Follow up with your primary care provider or an orthopedist if your symptoms are not improving.    Your blood pressure  is elevated today at 176/117.  Please have this rechecked by your primary care provider in 1-2 weeks.         ED Prescriptions    Medication Sig Dispense Auth. Provider   ibuprofen (ADVIL) 600 MG tablet Take 1 tablet (600 mg total) by mouth every 6 (six) hours as needed. 30 tablet Mickie Bail, NP   methocarbamol (ROBAXIN) 500 MG tablet Take 1 tablet (500 mg total) by mouth 2 (two) times daily. 20 tablet Mickie Bail, NP     PDMP not reviewed this encounter.   Mickie Bail, NP 11/18/20 1032

## 2020-11-21 ENCOUNTER — Ambulatory Visit: Payer: BC Managed Care – PPO | Attending: Internal Medicine | Admitting: Internal Medicine

## 2020-11-21 ENCOUNTER — Encounter: Payer: Self-pay | Admitting: Internal Medicine

## 2020-11-21 ENCOUNTER — Other Ambulatory Visit: Payer: Self-pay

## 2020-11-21 VITALS — BP 150/100 | HR 103 | Temp 98.2°F | Resp 16 | Ht 74.0 in | Wt 397.4 lb

## 2020-11-21 DIAGNOSIS — I5022 Chronic systolic (congestive) heart failure: Secondary | ICD-10-CM

## 2020-11-21 DIAGNOSIS — G4733 Obstructive sleep apnea (adult) (pediatric): Secondary | ICD-10-CM

## 2020-11-21 DIAGNOSIS — I10 Essential (primary) hypertension: Secondary | ICD-10-CM

## 2020-11-21 DIAGNOSIS — M5432 Sciatica, left side: Secondary | ICD-10-CM | POA: Diagnosis not present

## 2020-11-21 DIAGNOSIS — F17200 Nicotine dependence, unspecified, uncomplicated: Secondary | ICD-10-CM

## 2020-11-21 DIAGNOSIS — Z2821 Immunization not carried out because of patient refusal: Secondary | ICD-10-CM | POA: Insufficient documentation

## 2020-11-21 DIAGNOSIS — Z9989 Dependence on other enabling machines and devices: Secondary | ICD-10-CM

## 2020-11-21 MED ORDER — BUPROPION HCL ER (SR) 150 MG PO TB12: 150.0000 mg | ORAL_TABLET | Freq: Two times a day (BID) | ORAL | 1 refills | Status: DC

## 2020-11-21 MED ORDER — CYCLOBENZAPRINE HCL 10 MG PO TABS: 10.0000 mg | ORAL_TABLET | Freq: Two times a day (BID) | ORAL | 0 refills | Status: DC | PRN

## 2020-11-21 MED ORDER — LABETALOL HCL 300 MG PO TABS: 300.0000 mg | ORAL_TABLET | Freq: Two times a day (BID) | ORAL | 6 refills | Status: DC

## 2020-11-21 MED ORDER — TRAMADOL HCL 50 MG PO TABS: 50.0000 mg | ORAL_TABLET | Freq: Four times a day (QID) | ORAL | 0 refills | Status: AC | PRN

## 2020-11-21 MED ORDER — PREDNISONE 10 MG PO TABS: 10.0000 mg | ORAL_TABLET | Freq: Every day | ORAL | 0 refills | Status: DC

## 2020-11-21 NOTE — Patient Instructions (Addendum)
We have increased your blood pressure medication labetalol to 300 mg twice a day.  Continue to monitor your blood pressure with goal being 130/80 or lower.  Try to use your CPAP machine every night as discussed.  Obesity, Adult Obesity is having too much body fat. Being obese means that your weight is more than what is healthy for you. BMI is a number that explains how much body fat you have. If you have a BMI of 30 or more, you are obese. Obesity is often caused by eating or drinking more calories than your body uses. Changing your lifestyle can help you lose weight. Obesity can cause serious health problems, such as:  Stroke.  Coronary artery disease (CAD).  Type 2 diabetes.  Some types of cancer, including cancers of the colon, breast, uterus, and gallbladder.  Osteoarthritis.  High blood pressure (hypertension).  High cholesterol.  Sleep apnea.  Gallbladder stones.  Infertility problems. What are the causes?  Eating meals each day that are high in calories, sugar, and fat.  Being born with genes that may make you more likely to become obese.  Having a medical condition that causes obesity.  Taking certain medicines.  Sitting a lot (having a sedentary lifestyle).  Not getting enough sleep.  Drinking a lot of drinks that have sugar in them. What increases the risk?  Having a family history of obesity.  Being an [place] American woman.  Being a Hispanic man.  Living in an area with limited access to: ? [facility], recreation centers, or sidewalks. ? Healthy food choices, such as grocery stores and farmers' markets. What are the signs or symptoms? The main sign is having too much body fat. How is this treated?  Treatment for this condition often includes changing your lifestyle. Treatment may include: ? Changing your diet. This may include making a healthy meal plan. ? Exercise. This may include activity that causes your heart to beat faster (aerobic exercise)  and strength training. Work with your doctor to design a program that works for you. ? Medicine to help you lose weight. This may be used if you are not able to lose 1 pound a week after 6 weeks of healthy eating and more exercise. ? Treating conditions that cause the obesity. ? Surgery. Options may include gastric banding and gastric bypass. This may be done if:  Other treatments have not helped to improve your condition.  You have a BMI of 40 or higher.  You have life-threatening health problems related to obesity. Follow these instructions at home: Eating and drinking  Follow advice from your doctor about what to eat and drink. Your doctor may tell you to: ? Limit fast food, sweets, and processed snack foods. ? Choose low-fat options. For example, choose low-fat milk instead of whole milk. ? Eat 5 or more servings of fruits or vegetables each day. ? Eat at home more often. This gives you more control over what you eat. ? Choose healthy foods when you eat out. ? Learn to read food labels. This will help you learn how much food is in 1 serving. ? Keep low-fat snacks available. ? Avoid drinks that have a lot of sugar in them. These include soda, fruit juice, iced tea with sugar, and flavored milk.  Drink enough water to keep your pee (urine) pale yellow.  Do not go on fad diets.   Physical activity  Exercise often, as told by your doctor. Most adults should get up to 150 minutes of moderate-intensity  exercise every week.Ask your doctor: ? What types of exercise are safe for you. ? How often you should exercise.  Warm up and stretch before being active.  Do slow stretching after being active (cool down).  Rest between times of being active. Lifestyle  Work with your doctor and a food expert (dietitian) to set a weight-loss goal that is best for you.  Limit your screen time.  Find ways to reward yourself that do not involve food.  Do not drink alcohol if: ? Your doctor  tells you not to drink. ? You are pregnant, may be pregnant, or are planning to become pregnant.  If you drink alcohol: ? Limit how much you use to:  0-1 drink a day for women.  0-2 drinks a day for men. ? Be aware of how much alcohol is in your drink. In the [place] one drink equals one 12 oz bottle of beer (355 mL), one 5 oz glass of wine (148 mL), or one 1 oz glass of hard liquor (44 mL). General instructions  Keep a weight-loss journal. This can help you keep track of: ? The food that you eat. ? How much exercise you get.  Take over-the-counter and prescription medicines only as told by your doctor.  Take vitamins and supplements only as told by your doctor.  Think about joining a support group.  Keep all follow-up visits as told by your doctor. This is important. Contact a doctor if:  You cannot meet your weight loss goal after you have changed your diet and lifestyle for 6 weeks. Get help right away if you:  Are having trouble breathing.  Are having thoughts of harming yourself. Summary  Obesity is having too much body fat.  Being obese means that your weight is more than what is healthy for you.  Work with your doctor to set a weight-loss goal.  Get regular exercise as told by your doctor. This information is not intended to replace advice given to you by your health care provider. Make sure you discuss any questions you have with your health care provider. Document Revised: 06/02/2018 Document Reviewed: 06/02/2018 Elsevier Patient Education  2021 [employer].

## 2020-11-21 NOTE — Progress Notes (Signed)
Patient ID: Richard Hale, male    DOB: [birth year]  MRN: [id number]  CC: re-establish and Hospitalization Follow-up (Urgent care )   Subjective: Richard Hale is a 34 y.o. male who presents for chronic disease management.  Previous PCP was Dr. Jillyn Hidden. His concerns today include:  Patient with history of HTN, preDM, cardiomyopathy with EF 45% in 2020, OSA on CPAP, morbid obesity, tob dep  HYPERTENSION/Cardiomyopathy Currently taking: see medication list. Medications include amlodipine 10 mg, hydrochlorothiazide 25 mg, labetalol 200 mg twice a day, Ewa Pro 300 mg daily, spironolactone 50 mg daily. He took his medicines already for today. Med Adherence: [x]  Yes    []  No Medication side effects: []  Yes    [x]  No Adherence with salt restriction: [x]  Yes    []  No Home Monitoring?: [x]  Yes    []  No Monitoring Frequency: once a day with arm cuff Home BP results range: 150s/90s SOB? []  Yes    [x]  No Chest Pain?: []  Yes    [x]  No Leg swelling?: []  Yes    [x]  No Headaches?: []  Yes    [x]  No Dizziness? [x]  Yes when BP lower   Comments: Has an appointment coming up with the cardiologist Dr. on the third of next month.  OSA on CPAP: Compliant with using CPAP. Reports that on average he would miss using the CPAP machine about twice a week because he ends up falling asleep before he puts it on. The following morning on days when he forgets to use it he wakes up with a morning headache..  Tob since age 39.  Tried to quit in past.  Only able to last about 2 wks.  Has nicotine losengers even to him by a friend but has not started using them because there was a warning on it that if you have high blood pressure you should speak with your physician first before starting therapy Currently smoking 1/2 pk/day  Lower back pain: Patient seen at urgent care 3 days ago with complaints of lower back pain x2 weeks. No initiating factors. Pain is stabbing and twisting. It radiates down  the left leg. No loss of bowel or bladder function. Worse when laying down and turning over in bed. He does not feel it when he is up moving around. He does some lifting at work but uses a for anything over 40 pounds. He was prescribed some ibuprofen and Robaxin from urgent care but has not found those to be helpful.  Obesity: works in .  Works nights 6 days/wk. not getting in much exercise outside of work. Eating habits: "I need to change it up.  I eat vegetables but not all the time." Drinks tea, sodas and coffee, does not like water.  HM: Does not want COVID vaccines Patient Active Problem List   Diagnosis Date Noted  . Influenza vaccine refused 11/21/2020  . COVID-19 vaccination declined 11/21/2020  . Cardiomyopathy (HCC) 08/28/2019  . Chest pain of uncertain etiology 08/28/2019  . Acute coronary syndrome (HCC) 04/06/2019  . Tobacco use 04/06/2019  . Hypokalemia 04/06/2019  . Hypertensive urgency 12/14/2018  . Hypertension 09/05/2018  . Obstructive sleep apnea 09/05/2018  . Morbid obesity (HCC) 09/05/2018  . Prediabetes 09/05/2018     Current Outpatient Medications on File Prior to Visit  Medication Sig Dispense Refill  . amLODipine (NORVASC) 10 MG tablet Take 1 tablet (10 mg total) by mouth daily. PT OVERDUE FOR OV PLEASE CALL FOR APPT 30 tablet  0  . hydrochlorothiazide (HYDRODIURIL) 25 MG tablet Take 1 tablet (25 mg total) by mouth every morning. PT OVERDUE FOR OV PLEASE CALL FOR APPT 30 tablet 0  . irbesartan (AVAPRO) 300 MG tablet Take 1 tablet (300 mg total) by mouth daily. PT OVERDUE FOR OV PLEASE CALL FOR APPT 30 tablet 0  . pantoprazole (PROTONIX) 40 MG tablet Take 1 tablet (40 mg total) by mouth 2 (two) times daily. To reduce stomach acid (Patient not taking: Reported on 11/21/2020) 60 tablet 4  . spironolactone (ALDACTONE) 50 MG tablet Take 1 tablet (50 mg total) by mouth daily. PT OVERDUE FOR OV PLEASE CALL FOR APPT 30 tablet 2   Current  Facility-Administered Medications on File Prior to Visit  Medication Dose Route Frequency Provider Last Rate Last Admin  . cloNIDine (CATAPRES) tablet 0.1 mg  0.1 mg Oral Once Rollene Rotunda, MD      . cloNIDine (CATAPRES) tablet 0.1 mg  0.1 mg Oral Once Rollene Rotunda, MD        Allergies  Allergen Reactions  . Lisinopril Cough    Social History   Socioeconomic History  . Marital status: Single    Spouse name: Not on file  . Number of children: Not on file  . Years of education: Not on file  . Highest education level: Not on file  Occupational History  . Not on file  Tobacco Use  . Smoking status: Current Every Day Smoker    Packs/day: 0.25    Types: Cigarettes  . Smokeless tobacco: Never Used  . Tobacco comment: 5 cigs a day  Vaping Use  . Vaping Use: Never used  Substance and Sexual Activity  . Alcohol use: Yes    Comment: occ  . Drug use: No  . Sexual activity: Yes  Other Topics Concern  . Not on file  Social History Narrative   Lives with girlfriend and her kids.  He has three children.  Works at a [employer] broadcast [employer].     Social Determinants of Health   Financial Resource Strain: Not on file  Food Insecurity: Not on file  Transportation Needs: Not on file  Physical Activity: Not on file  Stress: Not on file  Social Connections: Not on file  Intimate Partner Violence: Not on file    Family History  Problem Relation Age of Onset  . Hypertension Mother   . Heart failure Mother   . Diabetes Maternal Grandmother   . CAD Father 70       Heart attack and Stroke    Past Surgical History:  Procedure Laterality Date  . NOSE SURGERY      ROS: Review of Systems Negative except as stated above  PHYSICAL EXAM: BP (!) 150/100   Pulse (!) 103   Temp 98.2 F (36.8 C)   Resp 16   Ht 6\' 2"  (1.88 m)   Wt (!) 397 lb 6.4 oz (180.3 kg)   SpO2 97%   BMI 51.02 kg/m   Wt Readings from Last 3 Encounters:  11/21/20 (!) 397 lb 6.4 oz (180.3 kg)  10/24/19 (!) 380 lb  (172.4 kg)  09/27/19 (!) 388 lb (176 kg)    Physical Exam  General appearance - alert, well appearing, morbidly obese African-American male and in no distress Mental status - normal mood, behavior, speech, dress, motor activity, and thought processes Neck - supple, no significant adenopathy Chest - clear to auscultation, no wheezes, rales or rhonchi, symmetric air entry Heart - normal rate, regular  rhythm, normal S1, S2, no murmurs, rubs, clicks or gallops Extremities - peripheral pulses normal, no pedal edema, no clubbing or cyanosis MSK: No tenderness on palpation of the lumbar spine and surrounding paraspinal muscles. Straight leg raise negative on both sides. Power in both lower extremities 5/5 bilaterally. CMP Latest Ref Rng & Units 05/18/2020 10/17/2019 04/07/2019  Glucose 70 - 99 mg/dL 660(Y) 301(S) 99  BUN 6 - 20 mg/dL 11 12 18   Creatinine 0.61 - 1.24 mg/dL 0.10 9.32  Sodium 135 - 145 mmol/L 139 139 139  Potassium 3.5 - 5.1 mmol/L 3.3(L) 3.5 3.5  Chloride 98 - 111 mmol/L 103 104 104  CO2 22 - 32 mmol/L 23 24 26   Calcium 8.9 - 10.3 mg/dL 8.9 3.55) )  Total Protein 6.5 - 8.1 g/dL - - -  Total Bilirubin 0.3 - 1.2 mg/dL - - -  Alkaline Phos 38 - 126 U/L - - -  AST 15 - 41 U/L - - -  ALT 0 - 44 U/L - - -   Lipid Panel     Component Value Date/Time   CHOL 132 04/07/2019 0856   CHOL 136 08/08/2018 1651   TRIG 143 04/07/2019 0856   HDL 43 04/07/2019 0856   HDL 49 08/08/2018 1651   CHOLHDL 3.1 04/07/2019 0856   VLDL 29 04/07/2019 0856   LDLCALC 60 04/07/2019 0856   LDLCALC 65 08/08/2018 1651    CBC    Component Value Date/Time   WBC 9.4 10/17/2019 1126   RBC 4.79 10/17/2019 1126   HGB 14.4 10/17/2019 1126   HGB 13.5 04/23/2017 1143   HCT 43.2 10/17/2019 1126   HCT 39.1 04/23/2017 1143   PLT 363 10/17/2019 1126   PLT 343 04/23/2017 1143   MCV 90.2 10/17/2019 1126   MCV 86 04/23/2017 1143   MCH 30.1 10/17/2019 1126   MCHC 33.3 10/17/2019 1126   RDW 13.2  10/17/2019 1126   RDW 14.0 04/23/2017 1143   LYMPHSABS 2.8 04/23/2017 1143   MONOABS 0.8 11/15/2016 2332   EOSABS 0.3 04/23/2017 1143   BASOSABS 0.0 04/23/2017 1143    ASSESSMENT AND PLAN:  1. Essential hypertension Not at goal. Continue current medications. I recommend increasing labetalol to 300 mg twice a day. Continue to check blood pressure at home with goal being 130/80 or lower. He has an appointment with his cardiologist next month and blood pressure can be rechecked at that visit. DASH diet discussed and encourage - CBC - Lipid panel - Comprehensive metabolic panel - labetalol (NORMODYNE) 300 MG tablet; Take 1 tablet (300 mg total) by mouth 2 (two) times daily. PT OVERDUE FOR OV PLEASE CALL FOR APPT  Dispense: 60 tablet; Refill: 6  2. Sciatic nerve pain, left Likely due to pinched nerve. I recommend that he avoid any heavy lifting over the next several days. When he returns to work he should use the cream to lift anything over 20 pounds. Stop the ibuprofen and Robaxin. I have given a short course of tramadol and Flexeril. I have warned that both medication can cause drowsiness. -Recommend use of a heating pad to the back. I have given him the next 4 days off from work. - traMADol (ULTRAM) 50 MG tablet; Take 1 tablet (50 mg total) by mouth every 6 (six) hours as needed for up to 5 days.  Dispense: 20 tablet; Refill: 0 - cyclobenzaprine (FLEXERIL) 10 MG tablet; Take 1 tablet (10 mg total) by mouth 2 (two) times daily as needed  for muscle spasms.  Dispense: 30 tablet; Refill: 0 - predniSONE (DELTASONE) 10 MG tablet; Take 1 tablet (10 mg total) by mouth daily with breakfast.  Dispense: 4 tablet; Refill: 0  3. Morbid obesity (HCC) Discussed and encourage healthy eating habits and regular exercise. Encouraged him to move as much as he can and to incorporate more movement into his daily activities like parking farther away from buildings and taking the stairs when he can. Discussed and  encourage healthy eating habits. Offered referral to medical weight management or the nutritionist but patient did not make a decision on this today. Printed information given on healthy eating habits. - Hemoglobin A1c  4. Tobacco dependence Advised to quit. Discussed health risks associated with smoking. Patient wanting to quit. I will hold off on using nicotine replacement products given that his blood pressure is markedly elevated. He is agreeable to trying Wellbutrin. Less than 5 minutes spent with counseling. - buPROPion (WELLBUTRIN SR) 150 MG 12 hr tablet; Take 1 tablet (150 mg total) by mouth 2 (two) times daily.  Dispense: 60 tablet; Refill: 1  5. OSA on CPAP Encourage consistent daily use of his CPAP.  6. CHF (congestive heart failure), NYHA class II, chronic, systolic (HCC) Compensated. Continue current medications and low-salt diet  7. Influenza vaccine refused  8. COVID-19 vaccination declined    Patient was given the opportunity to ask questions.  Patient verbalized understanding of the plan and was able to repeat key elements of the plan.   Orders Placed This Encounter  Procedures  . CBC  . Lipid panel  . Comprehensive metabolic panel  . Hemoglobin A1c     Requested Prescriptions   Signed Prescriptions Disp Refills  . buPROPion (WELLBUTRIN SR) 150 MG 12 hr tablet 60 tablet 1    Sig: Take 1 tablet (150 mg total) by mouth 2 (two) times daily.  . traMADol (ULTRAM) 50 MG tablet 20 tablet 0    Sig: Take 1 tablet (50 mg total) by mouth every 6 (six) hours as needed for up to 5 days.  . cyclobenzaprine (FLEXERIL) 10 MG tablet 30 tablet 0    Sig: Take 1 tablet (10 mg total) by mouth 2 (two) times daily as needed for muscle spasms.  . predniSONE (DELTASONE) 10 MG tablet 4 tablet 0    Sig: Take 1 tablet (10 mg total) by mouth daily with breakfast.  . labetalol (NORMODYNE) 300 MG tablet 60 tablet 6    Sig: Take 1 tablet (300 mg total) by mouth 2 (two) times daily. PT  OVERDUE FOR OV PLEASE CALL FOR APPT    Return in about 3 months (around 02/18/2021).  Jonah Blue, MD, FACP

## 2020-11-22 LAB — COMPREHENSIVE METABOLIC PANEL
ALT: 17 IU/L (ref 0–44)
AST: 20 IU/L (ref 0–40)
Albumin/Globulin Ratio: 1.3 (ref 1.2–2.2)
Albumin: 4.2 g/dL (ref 4.0–5.0)
Alkaline Phosphatase: 115 IU/L (ref 44–121)
BUN/Creatinine Ratio: 14 (ref 9–20)
BUN: 14 mg/dL (ref 6–20)
Bilirubin Total: 0.2 mg/dL (ref 0.0–1.2)
CO2: 22 mmol/L (ref 20–29)
Calcium: 9.6 mg/dL (ref 8.7–10.2)
Chloride: 102 mmol/L (ref 96–106)
Creatinine, Ser: 1.02 mg/dL (ref 0.76–1.27)
GFR calc Af Amer: 111 mL/min/{1.73_m2} (ref >59)
GFR calc non Af Amer: 96 mL/min/{1.73_m2} (ref >59)
Globulin, Total: 3.2 g/dL (ref 1.5–4.5)
Glucose: 154 mg/dL — ABNORMAL HIGH (ref 65–99)
Potassium: 3.8 mmol/L (ref 3.5–5.2)
Sodium: 141 mmol/L (ref 134–144)
Total Protein: 7.4 g/dL (ref 6.0–8.5)

## 2020-11-22 LAB — CBC
Hematocrit: 42 % (ref 37.5–51.0)
Hemoglobin: 14.6 g/dL (ref 13.0–17.7)
MCH: 30.1 pg (ref 26.6–33.0)
MCHC: 34.8 g/dL (ref 31.5–35.7)
MCV: 87 fL (ref 79–97)
Platelets: 367 10*3/uL (ref 150–450)
RBC: 4.85 x10E6/uL (ref 4.14–5.80)
RDW: 12.2 % (ref 11.6–15.4)
WBC: 6.8 10*3/uL (ref 3.4–10.8)

## 2020-11-22 LAB — HEMOGLOBIN A1C
Est. average glucose Bld gHb Est-mCnc: 128 mg/dL
Hgb A1c MFr Bld: 6.1 % — ABNORMAL HIGH (ref 4.8–5.6)

## 2020-11-22 LAB — LIPID PANEL
Chol/HDL Ratio: 2.6 ratio (ref 0.0–5.0)
Cholesterol, Total: 129 mg/dL (ref 100–199)
HDL: 49 mg/dL (ref >39)
LDL Chol Calc (NIH): 56 mg/dL (ref 0–99)
Triglycerides: 139 mg/dL (ref 0–149)
VLDL Cholesterol Cal: 24 mg/dL (ref 5–40)

## 2020-11-24 ENCOUNTER — Encounter: Payer: Self-pay | Admitting: Internal Medicine

## 2020-12-11 NOTE — Progress Notes (Deleted)
Cardiology Office Note   Date:  12/11/2020   ID:  Sixto, [place]dish December 28, 1985, MRN [id number]  PCP:  [name], MD  Cardiologist:   Rollene Rotunda, MD Referring:  [name], MD    No chief complaint on file.     History of Present Illness: Richard Hale is a 35 y.o. male who presents for evaluation of chest pain.   He had an echo in June with an EF of 45%.  He was in the hospital in March with hypertensive urgency for 1 night.  He did run out of his meds.  He was back in the hospital in June with hypertension.  That is when he was found to have a reduced ejection fraction.  He does not report any palpitations, presyncope or syncope.  He does not have any shortness of breath, PND or orthopnea.  He does have sleep apnea but he does not really wear his CPAP at night.   At the last visit he was describing chest pain.  this was non anginal.  He was very hypertensive at the last visit and required clonidine in the office.  ***     ***  He has chest discomfort.  This is constant.  Is been going on for quite a while.  It never lets up.  It is 8 out of 10 in intensity at its peak and down to 5 out of 10 in intensity if he takes Tylenol.  He cannot bring this on.  He cannot exacerbated with movement or activity.  Is not radiating to his jaw or to his arms.  Is under the left breast.  It is a sharp discomfort.  He has no association with palpation or movement.  He is still able to be active.  He had this discomfort in the hospital when his high-sensitivity troponin was normal.  It was felt to be atypical.  Of note his blood pressure is probably never well controlled otherwise not taking it routinely at home    Past Medical History:  Diagnosis Date  . CHF (congestive heart failure) (HCC)   . Hypertension   . Morbid obesity (HCC)   . Obstructive sleep apnea    CPAP  . Tobacco use 04/06/2019    Past Surgical History:  Procedure Laterality Date  . NOSE SURGERY        Current Outpatient Medications  Medication Sig Dispense Refill  . amLODipine (NORVASC) 10 MG tablet Take 1 tablet (10 mg total) by mouth daily. PT OVERDUE FOR OV PLEASE CALL FOR APPT 30 tablet 0  . buPROPion (WELLBUTRIN SR) 150 MG 12 hr tablet Take 1 tablet (150 mg total) by mouth 2 (two) times daily. 60 tablet 1  . cyclobenzaprine (FLEXERIL) 10 MG tablet Take 1 tablet (10 mg total) by mouth 2 (two) times daily as needed for muscle spasms. 30 tablet 0  . hydrochlorothiazide (HYDRODIURIL) 25 MG tablet Take 1 tablet (25 mg total) by mouth every morning. PT OVERDUE FOR OV PLEASE CALL FOR APPT 30 tablet 0  . irbesartan (AVAPRO) 300 MG tablet Take 1 tablet (300 mg total) by mouth daily. PT OVERDUE FOR OV PLEASE CALL FOR APPT 30 tablet 0  . labetalol (NORMODYNE) 300 MG tablet Take 1 tablet (300 mg total) by mouth 2 (two) times daily. PT OVERDUE FOR OV PLEASE CALL FOR APPT 60 tablet 6  . pantoprazole (PROTONIX) 40 MG tablet Take 1 tablet (40 mg total) by mouth 2 (two) times  daily. To reduce stomach acid (Patient not taking: Reported on 11/21/2020) 60 tablet 4  . predniSONE (DELTASONE) 10 MG tablet Take 1 tablet (10 mg total) by mouth daily with breakfast. 4 tablet 0  . spironolactone (ALDACTONE) 50 MG tablet Take 1 tablet (50 mg total) by mouth daily. PT OVERDUE FOR OV PLEASE CALL FOR APPT 30 tablet 2   Current Facility-Administered Medications  Medication Dose Route Frequency Provider Last Rate Last Admin  . cloNIDine (CATAPRES) tablet 0.1 mg  0.1 mg Oral Once Rollene Rotunda, MD      . cloNIDine (CATAPRES) tablet 0.1 mg  0.1 mg Oral Once Rollene Rotunda, MD        Allergies:   Lisinopril    ROS:  Please see the history of present illness.   Otherwise, review of systems are positive for ***.   All other systems are reviewed and negative.    PHYSICAL EXAM: VS:  There were no vitals taken for this visit. , BMI There is no height or weight on file to calculate BMI. GENERAL:  Well  appearing NECK:  No jugular venous distention, waveform within normal limits, carotid upstroke brisk and symmetric, no bruits, no thyromegaly LUNGS:  Clear to auscultation bilaterally CHEST:  Unremarkable HEART:  PMI not displaced or sustained,S1 and S2 within normal limits, no S3, no S4, no clicks, no rubs, *** murmurs ABD:  Flat, positive bowel sounds normal in frequency in pitch, no bruits, no rebound, no guarding, no midline pulsatile mass, no hepatomegaly, no splenomegaly EXT:  2 plus pulses throughout, no edema, no cyanosis no clubbing    ***GENERAL:  Well appearing HEENT:  Pupils equal round and reactive, fundi not visualized, oral mucosa unremarkable NECK:  No jugular venous distention, waveform within normal limits, carotid upstroke brisk and symmetric, no bruits, no thyromegaly LYMPHATICS:  No cervical, inguinal adenopathy LUNGS:  Clear to auscultation bilaterally BACK:  No CVA tenderness CHEST:  Unremarkable HEART:  PMI not displaced or sustained,S1 and S2 within normal limits, no S3, no S4, no clicks, no rubs, no murmurs ABD:  Flat, positive bowel sounds normal in frequency in pitch, no bruits, no rebound, no guarding, no midline pulsatile mass, no hepatomegaly, no splenomegaly EXT:  2 plus pulses throughout, no edema, no cyanosis no clubbing SKIN:  No rashes no nodules NEURO:  Cranial nerves II through XII grossly intact, motor grossly intact throughout PSYCH:  Cognitively intact, oriented to person place and time    EKG:  EKG is *** ordered today. The ekg ordered today demonstrates sinus rhythm, rate ***, left axis deviation, borderline QT prolongation, lateral T wave inversions consistent with repolarization changes.  This is not different from previous.   Recent Labs: 11/21/2020: ALT 17; BUN 14; Creatinine, Ser 1.02; Hemoglobin 14.6; Platelets 367; Potassium 3.8; Sodium 141    Lipid Panel    Component Value Date/Time   CHOL 129 11/21/2020 1152   TRIG 139  11/21/2020 1152   HDL 49 11/21/2020 1152   CHOLHDL 2.6 11/21/2020 1152   CHOLHDL 3.1 04/07/2019 0856   VLDL 29 04/07/2019 0856   LDLCALC 56 11/21/2020 1152      Wt Readings from Last 3 Encounters:  11/21/20 (!) 397 lb 6.4 oz (180.3 kg)  10/24/19 (!) 380 lb (172.4 kg)  09/27/19 (!) 388 lb (176 kg)      Other studies Reviewed: Additional studies/ records that were reviewed today include: *** Review of the above records demonstrates:  Please see elsewhere in the note.  ASSESSMENT AND PLAN:  CHEST PAIN:    ***   Chest pain is atypical.  There is no reason to suspect an ischemic etiology.  He had high-sensitivity troponins despite ongoing severe pain.  I suspect a musculoskeletal or GI source I referred him back to his primary provider.  CARDIOMYOPATHY:  ***  his is very likely related to hypertension he needs continued med adjustment as below.  I will consider other work-up although I am not strongly suspecting ischemic or infiltrative etiology.  MORBID OBESITY:   ***   We had a long discussion about this and I gave him very specific instructions.  He understands that this is a life limiting issue.  TOBACCO ABUSE :  ***   gave him the number for [id number].  HTN:    ***  We gave him clonidine 0.1 mg twice in the office.  He and I spent a long time talking about the need for blood pressure control.  I did add spironolactone 50 mg daily to his regimen and he will need a basic metabolic profile in 1 week.  We will see him back in a few weeks for further med titration.  He should keep a blood pressure diary.  SLEEP APNEA: ***  He says he could work with the machine and he could get it working and he understands the importance of this and he will try to comply.  Current medicines are reviewed at length with the patient today.  The patient does not have concerns regarding medicines.  The following changes have been made:  ***.   Labs/ tests ordered today include:  ***  No orders of  the defined types were placed in this encounter.    Disposition:   FU with APP in ***.       Signed, Rollene Rotunda, MD  12/11/2020 9:27 PM    [facility]

## 2020-12-12 ENCOUNTER — Ambulatory Visit: Payer: BC Managed Care – PPO | Admitting: Cardiology

## 2020-12-12 DIAGNOSIS — R072 Precordial pain: Secondary | ICD-10-CM

## 2020-12-12 DIAGNOSIS — I160 Hypertensive urgency: Secondary | ICD-10-CM

## 2020-12-12 DIAGNOSIS — Z720 Tobacco use: Secondary | ICD-10-CM

## 2021-03-27 NOTE — Progress Notes (Deleted)
Cardiology Office Note   Date:  03/27/2021   ID:  Richard, Hale 07-05-1986, MRN [id number]  PCP:  [name], MD  Cardiologist:   Rollene Rotunda, MD Referring:  [name], MD    No chief complaint on file.     History of Present Illness: Richard Hale is a 35 y.o. male who presents for evaluation of chest pain.   He had an echo in June with an EF of 45%.  ***    ***  I reviewed hospital records.  He was in the hospital in March with hypertensive urgency for 1 night.  He did run out of his meds.  He was back in the hospital in June with hypertension.  That is when he was found to have a reduced ejection fraction.  He does not report any palpitations, presyncope or syncope.  He does not have any shortness of breath, PND or orthopnea.  He does have sleep apnea but he does not really wear his CPAP at night.  He has chest discomfort.  This is constant.  Is been going on for quite a while.  It never lets up.  It is 8 out of 10 in intensity at its peak and down to 5 out of 10 in intensity if he takes Tylenol.  He cannot bring this on.  He cannot exacerbated with movement or activity.  Is not radiating to his jaw or to his arms.  Is under the left breast.  It is a sharp discomfort.  He has no association with palpation or movement.  He is still able to be active.  He had this discomfort in the hospital when his high-sensitivity troponin was normal.  It was felt to be atypical.  Of note his blood pressure is probably never well controlled otherwise not taking it routinely at home    Past Medical History:  Diagnosis Date   CHF (congestive heart failure) (HCC)    Hypertension    Morbid obesity (HCC)    Obstructive sleep apnea    CPAP   Tobacco use 04/06/2019    Past Surgical History:  Procedure Laterality Date   NOSE SURGERY       Current Outpatient Medications  Medication Sig Dispense Refill   amLODipine (NORVASC) 10 MG tablet Take 1 tablet (10 mg  total) by mouth daily. PT OVERDUE FOR OV PLEASE CALL FOR APPT 30 tablet 0   buPROPion (WELLBUTRIN SR) 150 MG 12 hr tablet Take 1 tablet (150 mg total) by mouth 2 (two) times daily. 60 tablet 1   cyclobenzaprine (FLEXERIL) 10 MG tablet Take 1 tablet (10 mg total) by mouth 2 (two) times daily as needed for muscle spasms. 30 tablet 0   hydrochlorothiazide (HYDRODIURIL) 25 MG tablet Take 1 tablet (25 mg total) by mouth every morning. PT OVERDUE FOR OV PLEASE CALL FOR APPT 30 tablet 0   irbesartan (AVAPRO) 300 MG tablet Take 1 tablet (300 mg total) by mouth daily. PT OVERDUE FOR OV PLEASE CALL FOR APPT 30 tablet 0   labetalol (NORMODYNE) 300 MG tablet Take 1 tablet (300 mg total) by mouth 2 (two) times daily. PT OVERDUE FOR OV PLEASE CALL FOR APPT 60 tablet 6   pantoprazole (PROTONIX) 40 MG tablet Take 1 tablet (40 mg total) by mouth 2 (two) times daily. To reduce stomach acid (Patient not taking: Reported on 11/21/2020) 60 tablet 4   predniSONE (DELTASONE) 10 MG tablet Take 1 tablet (10 mg total)  by mouth daily with breakfast. 4 tablet 0   spironolactone (ALDACTONE) 50 MG tablet Take 1 tablet (50 mg total) by mouth daily. PT OVERDUE FOR OV PLEASE CALL FOR APPT 30 tablet 2   Current Facility-Administered Medications  Medication Dose Route Frequency Provider Last Rate Last Admin   cloNIDine (CATAPRES) tablet 0.1 mg  0.1 mg Oral Once Rollene Rotunda, MD       cloNIDine (CATAPRES) tablet 0.1 mg  0.1 mg Oral Once Rollene Rotunda, MD        Allergies:   Lisinopril    ROS:  Please see the history of present illness.   Otherwise, review of systems are positive for ***.   All other systems are reviewed and negative.    PHYSICAL EXAM: VS:  There were no vitals taken for this visit. , BMI There is no height or weight on file to calculate BMI. GENERAL:  Well appearing NECK:  No jugular venous distention, waveform within normal limits, carotid upstroke brisk and symmetric, no bruits, no thyromegaly LUNGS:   Clear to auscultation bilaterally CHEST:  Unremarkable HEART:  PMI not displaced or sustained,S1 and S2 within normal limits, no S3, no S4, no clicks, no rubs, *** murmurs ABD:  Flat, positive bowel sounds normal in frequency in pitch, no bruits, no rebound, no guarding, no midline pulsatile mass, no hepatomegaly, no splenomegaly EXT:  2 plus pulses throughout, no edema, no cyanosis no clubbing     ***GENERAL:  Well appearing HEENT:  Pupils equal round and reactive, fundi not visualized, oral mucosa unremarkable NECK:  No jugular venous distention, waveform within normal limits, carotid upstroke brisk and symmetric, no bruits, no thyromegaly LYMPHATICS:  No cervical, inguinal adenopathy LUNGS:  Clear to auscultation bilaterally BACK:  No CVA tenderness CHEST:  Unremarkable HEART:  PMI not displaced or sustained,S1 and S2 within normal limits, no S3, no S4, no clicks, no rubs, no murmurs ABD:  Flat, positive bowel sounds normal in frequency in pitch, no bruits, no rebound, no guarding, no midline pulsatile mass, no hepatomegaly, no splenomegaly EXT:  2 plus pulses throughout, no edema, no cyanosis no clubbing SKIN:  No rashes no nodules NEURO:  Cranial nerves II through XII grossly intact, motor grossly intact throughout PSYCH:  Cognitively intact, oriented to person place and time    EKG:  EKG is *** ordered today. The ekg ordered today demonstrates sinus rhythm, rate ***, left axis deviation, borderline QT prolongation, lateral T wave inversions consistent with repolarization changes.  This is not different from previous.   Recent Labs: 11/21/2020: ALT 17; BUN 14; Creatinine, Ser 1.02; Hemoglobin 14.6; Platelets 367; Potassium 3.8; Sodium 141    Lipid Panel    Component Value Date/Time   CHOL 129 11/21/2020 1152   TRIG 139 11/21/2020 1152   HDL 49 11/21/2020 1152   CHOLHDL 2.6 11/21/2020 1152   CHOLHDL 3.1 04/07/2019 0856   VLDL 29 04/07/2019 0856   LDLCALC 56 11/21/2020  1152      Wt Readings from Last 3 Encounters:  11/21/20 (!) 397 lb 6.4 oz (180.3 kg)  10/24/19 (!) 380 lb (172.4 kg)  09/27/19 (!) 388 lb (176 kg)      Other studies Reviewed: Additional studies/ records that were reviewed today include: ***. Review of the above records demonstrates:  Please see elsewhere in the note.     ASSESSMENT AND PLAN:  CHEST PAIN:   Chest pain is *** atypical.  There is no reason to suspect an ischemic etiology.  He had high-sensitivity troponins despite ongoing severe pain.  I suspect a musculoskeletal or GI source I referred him back to his primary provider.  CARDIOMYOPATHY: This is *** very likely related to hypertension he needs continued med adjustment as below.  I will consider other work-up although I am not strongly suspecting ischemic or infiltrative etiology.  MORBID OBESITY:   ***  We had a long discussion about this and I gave him very specific instructions.  He understands that this is a life limiting issue.  TOBACCO ABUSE :   ***  I gave him the number for [id number].  HTN:   ***  We gave him clonidine 0.1 mg twice in the office.  He and I spent a long time talking about the need for blood pressure control.  I did add spironolactone 50 mg daily to his regimen and he will need a basic metabolic profile in 1 week.  We will see him back in a few weeks for further med titration.  He should keep a blood pressure diary.  SLEEP APNEA: ***  He says he could work with the machine and he could get it working and he understands the importance of this and he will try to comply.  Current medicines are reviewed at length with the patient today.  The patient does not have concerns regarding medicines.  The following changes have been made:  As above.   Labs/ tests ordered today include:   No orders of the defined types were placed in this encounter.    Disposition:   FU with APP in one month.       Signed, Rollene Rotunda, MD  03/27/2021 10:06 PM     [facility]

## 2021-03-28 ENCOUNTER — Ambulatory Visit: Payer: BC Managed Care – PPO | Admitting: Cardiology

## 2021-06-05 NOTE — Progress Notes (Signed)
Cardiology Office Note   Date:  06/06/2021   ID:  Breydon, Senters June 04, 1986, MRN [id number]  PCP:  [name], MD  Cardiologist:   Rollene Rotunda, MD Referring:  [name], MD    Chief Complaint  Patient presents with   Cardiomyopathy       History of Present Illness: Richard Hale is a 35 y.o. male who presents for evaluation of chest pain.   He had an echo in June with an EF of 45%.   He was hospitalized in March 2020 with hypertensive urgency due to running out of his medication.  He was hospitalized again in June with hypertension and found to have a reduced EF.  He was last seen by Dr. Antoine Poche in clinic on 08/29/2019.  At that time he reported chest discomfort that was constant.  He reported having this chest discomfort in the hospital with a high-sensitivity troponin that was normal.  Chest pressure was felt to be atypical.    Since I last saw him he has been compliant with his medications.  He says that in between taking his medications he sometimes notices blood pressure to be 190/112.  He works 7 PM to 7 AM.  He does not have any new shortness of breath, PND or orthopnea.  Has had no palpitations, presyncope or syncope.  He denies any chest pressure, neck or arm discomfort.  He has had no weight gain or loss or edema.  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   Past Medical History:  Diagnosis Date   CHF (congestive heart failure) (HCC)    Hypertension    Morbid obesity (HCC)    Obstructive sleep apnea    CPAP   Tobacco use 04/06/2019    Past Surgical History:  Procedure Laterality Date   NOSE SURGERY       Current Outpatient Medications  Medication Sig Dispense Refill   cloNIDine (CATAPRES - DOSED IN MG/24 HR) 0.3 mg/24hr patch Place 1 patch (0.3 mg total) onto the skin once a week. 4 patch 12   cloNIDine (CATAPRES) 0.1 MG tablet Take 1 tablet (0.1 mg total) by mouth every 8 (eight) hours as needed. 90 tablet 11    pantoprazole (PROTONIX) 40 MG tablet Take 1 tablet (40 mg total) by mouth 2 (two) times daily. To reduce stomach acid 60 tablet 4   amLODipine (NORVASC) 10 MG tablet Take 1 tablet (10 mg total) by mouth daily. 90 tablet 3   buPROPion (WELLBUTRIN SR) 150 MG 12 hr tablet Take 1 tablet (150 mg total) by mouth 2 (two) times daily. (Patient not taking: Reported on 06/06/2021) 60 tablet 1   cyclobenzaprine (FLEXERIL) 10 MG tablet Take 1 tablet (10 mg total) by mouth 2 (two) times daily as needed for muscle spasms. (Patient not taking: Reported on 06/06/2021) 30 tablet 0   hydrochlorothiazide (HYDRODIURIL) 25 MG tablet Take 1 tablet (25 mg total) by mouth every morning. 90 tablet 3   irbesartan (AVAPRO) 300 MG tablet Take 1 tablet (300 mg total) by mouth daily. 90 tablet 3   labetalol (NORMODYNE) 300 MG tablet Take 1 tablet (300 mg total) by mouth 2 (two) times daily. 180 tablet 3   predniSONE (DELTASONE) 10 MG tablet Take 1 tablet (10 mg total) by mouth daily with breakfast. (Patient not taking: Reported on 06/06/2021) 4 tablet 0   spironolactone (ALDACTONE) 50 MG tablet Take 1 tablet (50 mg total) by mouth daily. 90  tablet 3   Current Facility-Administered Medications  Medication Dose Route Frequency Provider Last Rate Last Admin   cloNIDine (CATAPRES) tablet 0.1 mg  0.1 mg Oral Once Rollene Rotunda, MD       cloNIDine (CATAPRES) tablet 0.1 mg  0.1 mg Oral Once Rollene Rotunda, MD        Allergies:   Lisinopril    Social History:  The patient  reports that he has been smoking cigarettes. He has been smoking an average of .25 packs per day. He has never used smokeless tobacco. He reports current alcohol use. He reports that he does not use drugs.   Family History:  The patient's family history includes CAD (age of onset: 14) in his father; Diabetes in his maternal grandmother; Heart failure in his mother; Hypertension in his mother.    ROS:  Please see the history of present illness.   Otherwise,  review of systems are positive for none.   All other systems are reviewed and negative.    PHYSICAL EXAM: VS:  BP (!) 154/99   Pulse 79   Ht 6\' 2"  (1.88 m)   Wt (!) 397 lb 12.8 oz (180.4 kg)   SpO2 99%   BMI 51.07 kg/m  , BMI Body mass index is 51.07 kg/m. GENERAL:  Well appearing HEENT:  Pupils equal round and reactive, fundi not visualized, oral mucosa unremarkable NECK:  No jugular venous distention, waveform within normal limits, carotid upstroke brisk and symmetric, no bruits, no thyromegaly LYMPHATICS:  No cervical, inguinal adenopathy LUNGS:  Clear to auscultation bilaterally BACK:  No CVA tenderness CHEST:  Unremarkable HEART:  PMI not displaced or sustained,S1 and S2 within normal limits, no S3, no S4, no clicks, no rubs, no murmurs ABD:  Flat, positive bowel sounds normal in frequency in pitch, no bruits, no rebound, no guarding, no midline pulsatile mass, no hepatomegaly, no splenomegaly EXT:  2 plus pulses throughout, no edema, no cyanosis no clubbing SKIN:  No rashes no nodules NEURO:  Cranial nerves II through XII grossly intact, motor grossly intact throughout PSYCH:  Cognitively intact, oriented to person place and time    EKG:  EKG is ordered today. The ekg ordered today demonstrates sinus rhythm, rate 79, left axis deviation, borderline QT prolongation, lateral T wave inversions consistent with repolarization changes.  This is not different from previous.   Recent Labs: 11/21/2020: ALT 17; BUN 14; Creatinine, Ser 1.02; Hemoglobin 14.6; Platelets 367; Potassium 3.8; Sodium 141    Lipid Panel    Component Value Date/Time   CHOL 129 11/21/2020 1152   TRIG 139 11/21/2020 1152   HDL 49 11/21/2020 1152   CHOLHDL 2.6 11/21/2020 1152   CHOLHDL 3.1 04/07/2019 0856   VLDL 29 04/07/2019 0856   LDLCALC 56 11/21/2020 1152      Wt Readings from Last 3 Encounters:  06/06/21 (!) 397 lb 12.8 oz (180.4 kg)  11/21/20 (!) 397 lb 6.4 oz (180.3 kg)  10/24/19 (!) 380 lb  (172.4 kg)      Other studies Reviewed: Additional studies/ records that were reviewed today include: Hospital records and echo. . Review of the above records demonstrates:  Please see elsewhere in the note.     ASSESSMENT AND PLAN:  CHEST PAIN:   He has had no further chest pain.  No change in therapy.  CARDIOMYOPATHY: He has had mildly reduced ejection fraction.  Once we get his blood pressure controlled I might repeat an echocardiogram just for baseline.  Its been  a few years.  Right now he seems to have class I symptoms.  No change in therapy.   MORBID OBESITY: Simplified the dietary instructions to say he needs to stop drinking his high sugar content drinks.   TOBACCO ABUSE :   He understands the need to quit smoking.  HTN:   He is going to continue with as needed clonidine which he has been using on his own.  I will give him a prescription for every 8 hours.  He responds well to this and I would like to start a Catapres patch #3.    SLEEP APNEA: He is unfortunately unable to use CPAP.  We talked about weight loss.   Current medicines are reviewed at length with the patient today.  The patient does not have concerns regarding medicines.  The following changes have been made: As above  Labs/ tests ordered today include: None  Orders Placed This Encounter  Procedures   EKG 12-Lead      Disposition:   FU with Edd Fabian NP in 3 - 4 months.    Signed, Rollene Rotunda, MD  06/06/2021 2:28 PM     [facility]

## 2021-06-06 ENCOUNTER — Other Ambulatory Visit: Payer: Self-pay

## 2021-06-06 ENCOUNTER — Ambulatory Visit (INDEPENDENT_AMBULATORY_CARE_PROVIDER_SITE_OTHER): Payer: BC Managed Care – PPO | Admitting: Cardiology

## 2021-06-06 ENCOUNTER — Encounter: Payer: Self-pay | Admitting: Cardiology

## 2021-06-06 VITALS — BP 154/99 | HR 79 | Ht 74.0 in | Wt 397.8 lb

## 2021-06-06 DIAGNOSIS — I10 Essential (primary) hypertension: Secondary | ICD-10-CM | POA: Diagnosis not present

## 2021-06-06 DIAGNOSIS — I429 Cardiomyopathy, unspecified: Secondary | ICD-10-CM | POA: Diagnosis not present

## 2021-06-06 DIAGNOSIS — Z720 Tobacco use: Secondary | ICD-10-CM | POA: Diagnosis not present

## 2021-06-06 DIAGNOSIS — R072 Precordial pain: Secondary | ICD-10-CM | POA: Diagnosis not present

## 2021-06-06 MED ORDER — CLONIDINE HCL 0.1 MG PO TABS: 0.1000 mg | ORAL_TABLET | Freq: Three times a day (TID) | ORAL | 11 refills | Status: DC | PRN

## 2021-06-06 MED ORDER — AMLODIPINE BESYLATE 10 MG PO TABS: 10.0000 mg | ORAL_TABLET | Freq: Every day | ORAL | 3 refills | Status: DC

## 2021-06-06 MED ORDER — LABETALOL HCL 300 MG PO TABS: 300.0000 mg | ORAL_TABLET | Freq: Two times a day (BID) | ORAL | 3 refills | Status: DC

## 2021-06-06 MED ORDER — IRBESARTAN 300 MG PO TABS: 300.0000 mg | ORAL_TABLET | Freq: Every day | ORAL | 3 refills | Status: DC

## 2021-06-06 MED ORDER — CLONIDINE 0.3 MG/24HR TD PTWK: 0.3000 mg | MEDICATED_PATCH | TRANSDERMAL | 12 refills | Status: DC

## 2021-06-06 MED ORDER — HYDROCHLOROTHIAZIDE 25 MG PO TABS: 25.0000 mg | ORAL_TABLET | Freq: Every morning | ORAL | 3 refills | Status: DC

## 2021-06-06 MED ORDER — SPIRONOLACTONE 50 MG PO TABS: 50.0000 mg | ORAL_TABLET | Freq: Every day | ORAL | 3 refills | Status: DC

## 2021-06-06 NOTE — Patient Instructions (Signed)
Medication Instructions:  Dr. Antoine Poche has prescribed the following:  -- Clonidine (Catapres) patch - change once weekly  -- Clonidine 0.1mg  tablet to use 1 tablet every 8 hours as needed for elevated BP  *If you need a refill on your cardiac medications before your next appointment, please call your pharmacy*  Follow-Up: At [facility], you and your health needs are our priority.  As part of our continuing mission to provide you with exceptional heart care, we have created designated Provider Care Teams.  These Care Teams include your primary Cardiologist (physician) and Advanced Practice Providers (APPs -  Physician Assistants and Nurse Practitioners) who all work together to provide you with the care you need, when you need it.  We recommend signing up for the patient portal called "MyChart".  Sign up information is provided on this After Visit Summary.  MyChart is used to connect with patients for Virtual Visits (Telemedicine).  Patients are able to view lab/test results, encounter notes, upcoming appointments, etc.  Non-urgent messages can be sent to your provider as well.   To learn more about what you can do with MyChart, go to [email or web address].    Your next appointment:   3 month(s)  The format for your next appointment:   In Person  Provider:   Edd Fabian, FNP   Other Instructions

## 2021-06-24 DIAGNOSIS — I10 Essential (primary) hypertension: Secondary | ICD-10-CM

## 2021-06-25 MED ORDER — CLONIDINE 0.3 MG/24HR TD PTWK: 0.3000 mg | MEDICATED_PATCH | TRANSDERMAL | 12 refills | Status: DC

## 2021-06-25 MED ORDER — LABETALOL HCL 300 MG PO TABS: 300.0000 mg | ORAL_TABLET | Freq: Two times a day (BID) | ORAL | 11 refills | Status: DC

## 2021-06-25 MED ORDER — SPIRONOLACTONE 50 MG PO TABS: 50.0000 mg | ORAL_TABLET | Freq: Every day | ORAL | 11 refills | Status: DC

## 2021-06-25 MED ORDER — AMLODIPINE BESYLATE 10 MG PO TABS: 10.0000 mg | ORAL_TABLET | Freq: Every day | ORAL | 11 refills | Status: DC

## 2021-06-25 MED ORDER — HYDROCHLOROTHIAZIDE 25 MG PO TABS: 25.0000 mg | ORAL_TABLET | Freq: Every morning | ORAL | 11 refills | Status: DC

## 2021-06-25 MED ORDER — IRBESARTAN 300 MG PO TABS
300.0000 mg | ORAL_TABLET | Freq: Every day | ORAL | 11 refills | Status: DC
Start: 2021-06-25 — End: 2021-11-20

## 2021-08-19 ENCOUNTER — Ambulatory Visit: Payer: BC Managed Care – PPO | Admitting: General Practice

## 2021-09-09 ENCOUNTER — Ambulatory Visit: Payer: BC Managed Care – PPO | Admitting: Cardiology

## 2021-09-25 NOTE — Progress Notes (Deleted)
Cardiology Office Note:    Date:  09/25/2021   ID:  Richard Hale, DOB September 14, 1986, MRN [id number]  PCP:  [name], MD  Cardiologist:  Rollene Rotunda, MD   Referring MD: [name], MD   No chief complaint on file. ***  History of Present Illness:    Richard Hale is a 35 y.o. male with a hx of chronic systolic heart failure with an EF of 45%, OSA not on CPAP, hypertension, and medication noncompliance.  He was hospitalized in March 2020 with hypertensive urgency due to running out of his medication.  He was hospitalized again in June with hypertension and found to have a reduced EF.  He was last seen by Dr. Antoine Poche in clinic on 08/29/2019.  At that time he reported chest discomfort that was constant.  He reported having this chest discomfort in the hospital with a high-sensitivity troponin that was normal.  Chest pressure was felt to be atypical.  Hochrein suspected uncontrolled hypertension at that time.  He was maintained on 10 mg of amlodipine, 25 mg HCTZ, 300 mg irbesartan, and labetalol twice daily, 50 mg spironolactone.  He was given 0.1 mg clonidine in the office. He has since followed up with Dr. Antoine Poche and Edd Fabian NP. He was last seen in Aug 2022 and reported medication compliance. He was not using CPAP because he sleeps on his stomach.   He presents today for routine follow up.     Chest pain Atypical and in the setting of normal troponin.    Hypertension - 10 mg of amlodipine, 25 mg HCTZ, 300 mg irbesartan, and 300 mg labetalol twice daily, 50 mg spironolactone, 0.3 mg clonidine patch - BP today    Cardiomyopathy Chronic systolic heart failure, EF 45% Suspect this is related to his uncontrolled hypertension   Morbid obesity Instructed to stop drinking sugary beverages   Ongoing tobacco use Recommended cessation   OSA Not using CPAP, he sleeps on his stomach        Past Medical History:  Diagnosis Date   CHF  (congestive heart failure) (HCC)    Hypertension    Morbid obesity (HCC)    Obstructive sleep apnea    CPAP   Tobacco use 04/06/2019    Past Surgical History:  Procedure Laterality Date   NOSE SURGERY      Current Medications: No outpatient medications have been marked as taking for the 09/26/21 encounter (Appointment) with Marcelino Duster, PA.   Current Facility-Administered Medications for the 09/26/21 encounter (Appointment) with Marcelino Duster, PA  Medication   cloNIDine (CATAPRES) tablet 0.1 mg   cloNIDine (CATAPRES) tablet 0.1 mg     Allergies:   Lisinopril   Social History   Socioeconomic History   Marital status: Married    Spouse name: Not on file   Number of children: Not on file   Years of education: Not on file   Highest education level: Not on file  Occupational History   Not on file  Tobacco Use   Smoking status: Every Day    Packs/day: 0.25    Types: Cigarettes   Smokeless tobacco: Never   Tobacco comments:    5 cigs a day  Vaping Use   Vaping Use: Never used  Substance and Sexual Activity   Alcohol use: Yes    Comment: occ   Drug use: No   Sexual activity: Yes  Other Topics Concern   Not on file  Social History Narrative  Lives with girlfriend and her kids.  He has three children.  Works at a Radio broadcast assistant.     Social Determinants of Health   Financial Resource Strain: Not on file  Food Insecurity: Not on file  Transportation Needs: Not on file  Physical Activity: Not on file  Stress: Not on file  Social Connections: Not on file     Family History: The patient's ***family history includes CAD (age of onset: 51) in his father; Diabetes in his maternal grandmother; Heart failure in his mother; Hypertension in his mother.  ROS:   Please see the history of present illness.    *** All other systems reviewed and are negative.  EKGs/Labs/Other Studies Reviewed:    The following studies were reviewed today:  Echo 04/07/19: 1.  The left ventricle has a visually estimated ejection fraction of 45%.  The cavity size was mildly dilated. There is moderately increased left  ventricular wall thickness. Left ventricular diastolic Doppler parameters  are consistent with  pseudonormalization. Left ventrical global hypokinesis without regional  wall motion abnormalities.   2. The right ventricle has normal systolic function. The cavity was  normal. There is no increase in right ventricular wall thickness.   3. No evidence of mitral valve stenosis. No mitral regurgitation.   4. The aortic valve is tricuspid. No stenosis of the aortic valve.   5. The aortic root is normal in size and structure. No complete TR  doppler jet so unable to estimate PA systolic pressure.   EKG:  EKG is *** ordered today.  The ekg ordered today demonstrates ***  Recent Labs: 11/21/2020: ALT 17; BUN 14; Creatinine, Ser 1.02; Hemoglobin 14.6; Platelets 367; Potassium 3.8; Sodium 141  Recent Lipid Panel    Component Value Date/Time   CHOL 129 11/21/2020 1152   TRIG 139 11/21/2020 1152   HDL 49 11/21/2020 1152   CHOLHDL 2.6 11/21/2020 1152   CHOLHDL 3.1 04/07/2019 0856   VLDL 29 04/07/2019 0856   LDLCALC 56 11/21/2020 1152    Physical Exam:    VS:  There were no vitals taken for this visit.    Wt Readings from Last 3 Encounters:  06/06/21 (!) 397 lb 12.8 oz (180.4 kg)  11/21/20 (!) 397 lb 6.4 oz (180.3 kg)  10/24/19 (!) 380 lb (172.4 kg)     GEN: *** Well nourished, well developed in no acute distress HEENT: Normal NECK: No JVD; No carotid bruits LYMPHATICS: No lymphadenopathy CARDIAC: ***RRR, no murmurs, rubs, gallops RESPIRATORY:  Clear to auscultation without rales, wheezing or rhonchi  ABDOMEN: Soft, non-tender, non-distended MUSCULOSKELETAL:  No edema; No deformity  SKIN: Warm and dry NEUROLOGIC:  Alert and oriented x 3 PSYCHIATRIC:  Normal affect   ASSESSMENT:    No diagnosis found. PLAN:    In order of problems listed  above:  No diagnosis found.   Medication Adjustments/Labs and Tests Ordered: Current medicines are reviewed at length with the patient today.  Concerns regarding medicines are outlined above.  No orders of the defined types were placed in this encounter.  No orders of the defined types were placed in this encounter.   Signed, Marcelino Duster, [place]  09/25/2021 10:29 AM    [facility]

## 2021-09-26 ENCOUNTER — Ambulatory Visit: Payer: BC Managed Care – PPO | Admitting: Physician Assistant

## 2021-10-15 ENCOUNTER — Ambulatory Visit (INDEPENDENT_AMBULATORY_CARE_PROVIDER_SITE_OTHER): Payer: BC Managed Care – PPO | Admitting: Orthopedic Surgery

## 2021-10-15 ENCOUNTER — Ambulatory Visit: Payer: Self-pay

## 2021-10-15 ENCOUNTER — Other Ambulatory Visit: Payer: Self-pay

## 2021-10-15 ENCOUNTER — Encounter: Payer: Self-pay | Admitting: Orthopedic Surgery

## 2021-10-15 DIAGNOSIS — M79601 Pain in right arm: Secondary | ICD-10-CM

## 2021-10-15 DIAGNOSIS — M79604 Pain in right leg: Secondary | ICD-10-CM | POA: Diagnosis not present

## 2021-10-15 DIAGNOSIS — M545 Low back pain, unspecified: Secondary | ICD-10-CM | POA: Diagnosis not present

## 2021-10-15 DIAGNOSIS — M79602 Pain in left arm: Secondary | ICD-10-CM | POA: Diagnosis not present

## 2021-10-15 NOTE — Progress Notes (Signed)
Office Visit Note   Patient: Richard Hale           Date of Birth: 09/04/1986           MRN: [id number] Visit Date: 10/15/2021 Requested by: [name], MD [address] [place],  [place] [address] PCP: [name], MD  Subjective: Chief Complaint  Patient presents with   Other    Arm pain Back pain    HPI: Richard Hale is a 36 year old patient who works at [employer].  Describes bilateral shoulder pain right worse than left along with neck pain and low back pain.  Patient states he has right shoulder pain for the past 8 to 9 months.  No known injury.  The pain does wake him from sleep at night.  Runs down the right arm with occasional numbness and tingling as well as some neck pain.  He reports decreased strength in the right arm over the last several months.  Has some occasional upper back and scapular pain 2.  Putting arm up overhead does not make his pain better but putting the arm behind his back does improve his symptoms.  He cannot sleep on the right-hand side.  Denies any mechanical symptoms in the shoulder.  Denies any history of instability or injury to the shoulder itself.  The neck pain is severe enough at times that it gives him headaches.  Patient also reports low back pain.  Reports pain radiating down the back of the right leg into the ankle region.  Left leg symptoms not present.  He has to lift up to 50 pounds at times but is primarily involved in management.  His pain in his back is worse with walking or standing but not as bad with sitting.  He has tried ibuprofen Tylenol and muscle relaxer with no relief.              ROS: All systems reviewed are negative as they relate to the chief complaint within the history of present illness.  Patient denies  fevers or chills.   Assessment & Plan: Visit Diagnoses:  1. Bilateral arm pain   2. Low back pain, unspecified back pain laterality, unspecified chronicity, unspecified whether sciatica present    3. Pain in right leg     Plan: Impression is normal bilateral shoulder exams.  Rotator cuff strength is good.  This looks like radicular pain from the neck extending down to the arm.  Symptoms have been ongoing now for over 6 months.  Has failed conservative management.  Interfering with work and activities of daily living.  Plan MRI cervical spine to evaluate for right-sided radiculopathy.  Radiographs show loss of lordosis but not much in the way of degenerative changes.  Regarding the back I think the patient does have some spondylolisthesis and possible pars defect at L5-S1.  Radiographs difficult to interpret today due to soft tissue envelope.  Not much in the way of degenerative changes but there is some posterior element asymmetry in the lower lumbar levels compared with the mid to upper levels.  Based on those radiographic abnormalities and possible chronic pars defect plan MRI lumbar spine to evaluate right-sided radiculopathy.  Follow-up after that study.  Follow-Up Instructions: Return for after MRI.   Orders:  Orders Placed This Encounter  Procedures   XR Cervical Spine 2 or 3 views   XR Lumbar Spine 2-3 Views   XR Shoulder Right   MR Cervical Spine w/o contrast  MR Lumbar Spine w/o contrast   No orders of the defined types were placed in this encounter.     Procedures: No procedures performed   Clinical Data: No additional findings.  Objective: Vital Signs: There were no vitals taken for this visit.  Physical Exam:   Constitutional: Patient appears well-developed HEENT:  Head: Normocephalic Eyes:EOM are normal Neck: Normal range of motion Cardiovascular: Normal rate Pulmonary/chest: Effort normal Neurologic: Patient is alert Skin: Skin is warm Psychiatric: Patient has normal mood and affect   Ortho Exam: Ortho exam demonstrates pretty reasonable cervical spine range of motion.  5 out of 5 grip EPL FPL interosseous wrist flexion extension bicep triceps and  deltoid strength.  Does have paresthesias in the C5 and C6 distribution on the right compared to the left.  Reflexes symmetric 0 to 1+ out of 4 bilateral biceps and triceps.  Radial pulse intact bilaterally.  No muscle atrophy on either side.  Right shoulder exam demonstrates full active and passive range of motion with negative apprehension relocation testing and excellent rotator cuff strength infraspinatus supraspinatus and subscap muscle testing.  No masses lymphadenopathy or skin changes noted in that shoulder girdle region.  Patient has no nerve root tension signs.  Palpable posterior tib pulses bilaterally good ankle dorsiflexion plantarflexion strength with no groin pain with internal ex rotation of the leg.  No paresthesias L1 S1 bilaterally.  Does have more pain with extension and flexion.  No trochanteric tenderness.  Specialty Comments:  No specialty comments available.  Imaging: XR Cervical Spine 2 or 3 views  Result Date: 10/15/2021 AP lateral radiographs cervical spine reviewed.  There is loss of lordosis.  No acute fracture.  No spondylolisthesis.  No significant facet arthritis or degenerative disc disease.  XR Lumbar Spine 2-3 Views  Result Date: 10/15/2021 AP lateral radiographs lumbar spine reviewed.  L5-S1 spondylolisthesis grade 1 is present.  No significant degenerative disc disease in the remainder of the lumbar spine.  There is posterior element asymmetry in the L5-S1 region compared to higher level lumbar spine elements.  Visualized hips intact.  XR Shoulder Right  Result Date: 10/15/2021 AP axillary outlet radiographs right shoulder reviewed.  Shoulder is located.  No acute fracture.  No significant glenohumeral joint arthritis or AC joint arthritis.  Acromiohumeral distance maintained.    PMFS History: Patient Active Problem List   Diagnosis Date Noted   Influenza vaccine refused 11/21/2020   COVID-19 vaccination declined 11/21/2020   CHF (congestive heart  failure), NYHA class II, chronic, systolic (HCC) 11/21/2020   Tobacco dependence 11/21/2020   Cardiomyopathy (HCC) 08/28/2019   Chest pain of uncertain etiology 08/28/2019   Acute coronary syndrome (HCC) 04/06/2019   Tobacco use 04/06/2019   Hypokalemia 04/06/2019   Hypertensive urgency 12/14/2018   Essential hypertension 09/05/2018   Obstructive sleep apnea 09/05/2018   Morbid obesity (HCC) 09/05/2018   Prediabetes 09/05/2018   Past Medical History:  Diagnosis Date   CHF (congestive heart failure) (HCC)    Hypertension    Morbid obesity (HCC)    Obstructive sleep apnea    CPAP   Tobacco use 04/06/2019    Family History  Problem Relation Age of Onset   Hypertension Mother    Heart failure Mother    Diabetes Maternal Grandmother    CAD Father 59       Heart attack and Stroke    Past Surgical History:  Procedure Laterality Date   NOSE SURGERY     Social History   Occupational  History   Not on file  Tobacco Use   Smoking status: Every Day    Packs/day: 0.25    Types: Cigarettes   Smokeless tobacco: Never   Tobacco comments:    5 cigs a day  Vaping Use   Vaping Use: Never used  Substance and Sexual Activity   Alcohol use: Yes    Comment: occ   Drug use: No   Sexual activity: Yes

## 2021-10-23 ENCOUNTER — Telehealth: Payer: Self-pay | Admitting: Orthopedic Surgery

## 2021-10-23 NOTE — Telephone Encounter (Signed)
Called patient left message to return call to schedule an MRI review with Dr. Marlou Sa     MRI scheduled 11/02/2021

## 2021-11-02 ENCOUNTER — Ambulatory Visit
Admission: RE | Admit: 2021-11-02 | Discharge: 2021-11-02 | Disposition: A | Discharge status: Home / Self Care | Payer: BC Managed Care – PPO | Source: Ambulatory Visit | Attending: Orthopedic Surgery | Admitting: Orthopedic Surgery

## 2021-11-02 ENCOUNTER — Other Ambulatory Visit: Payer: Self-pay

## 2021-11-02 DIAGNOSIS — M79602 Pain in left arm: Secondary | ICD-10-CM

## 2021-11-02 DIAGNOSIS — M79601 Pain in right arm: Secondary | ICD-10-CM

## 2021-11-02 DIAGNOSIS — M48061 Spinal stenosis, lumbar region without neurogenic claudication: Secondary | ICD-10-CM | POA: Diagnosis not present

## 2021-11-02 DIAGNOSIS — M4802 Spinal stenosis, cervical region: Secondary | ICD-10-CM | POA: Diagnosis not present

## 2021-11-02 DIAGNOSIS — M545 Low back pain, unspecified: Secondary | ICD-10-CM

## 2021-11-02 DIAGNOSIS — M79604 Pain in right leg: Secondary | ICD-10-CM

## 2021-11-07 ENCOUNTER — Other Ambulatory Visit: Payer: Self-pay

## 2021-11-07 ENCOUNTER — Ambulatory Visit (INDEPENDENT_AMBULATORY_CARE_PROVIDER_SITE_OTHER): Payer: BC Managed Care – PPO | Admitting: Orthopedic Surgery

## 2021-11-07 DIAGNOSIS — M545 Low back pain, unspecified: Secondary | ICD-10-CM

## 2021-11-07 DIAGNOSIS — M79601 Pain in right arm: Secondary | ICD-10-CM

## 2021-11-07 DIAGNOSIS — M542 Cervicalgia: Secondary | ICD-10-CM

## 2021-11-07 DIAGNOSIS — M79602 Pain in left arm: Secondary | ICD-10-CM | POA: Diagnosis not present

## 2021-11-11 ENCOUNTER — Encounter: Payer: Self-pay | Admitting: Orthopedic Surgery

## 2021-11-11 ENCOUNTER — Other Ambulatory Visit: Payer: Self-pay | Admitting: Physical Medicine and Rehabilitation

## 2021-11-11 DIAGNOSIS — M542 Cervicalgia: Secondary | ICD-10-CM

## 2021-11-11 NOTE — Progress Notes (Signed)
Office Visit Note   Patient: Richard Hale           Date of Birth: 28-Oct-1985           MRN: [id number] Visit Date: 11/07/2021 Requested by: [name], MD [address] [place],  [place] [address] PCP: [name], MD  Subjective: Chief Complaint  Patient presents with   Neck - Follow-up   Lower Back - Follow-up    HPI: Richard Hale is a 36 year old patient with neck pain and low back pain.  His neck hurts him worse than the back.  He does not take any blood thinners.  Upper extremity wise the right arm and shoulder and hand are equal and pain to the left side.  It does radiate down the right arm more than the left.  His right leg is also more painful than the left leg.  He states when he puts his hands behind his back his symptoms are better.  Both scans are reviewed with the patient today.  On the lower spine he has chronic bilateral pars defects at L5 with trace spondylolisthesis.  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  Cervical spine MRI demonstrates disc bulging with probable tiny right paracentral disc protrusion at C5-6 and C6-7 but with no significant spinal stenosis or cord deformity.              ROS: All systems reviewed are negative as they relate to the chief complaint within the history of present illness.  Patient denies  fevers or chills.   Assessment & Plan: Visit Diagnoses:  1. Bilateral arm pain   2. Low back pain, unspecified back pain laterality, unspecified chronicity, unspecified whether sciatica present     Plan: Impression is back and neck pain.  He does have radiographically worse lower back findings but clinically his neck and right arm are hurting him more.  Shoulder exam is pretty unremarkable.  Plan to refer him to Dr. Alvester Morin for cervical spine ESI.  He can follow-up with me 4 weeks after that injection depending on resolution of symptoms.  Follow-Up Instructions: No follow-ups on file.   Orders:  No  orders of the defined types were placed in this encounter.  No orders of the defined types were placed in this encounter.     Procedures: No procedures performed   Clinical Data: No additional findings.  Objective: Vital Signs: There were no vitals taken for this visit.  Physical Exam:   Constitutional: Patient appears well-developed HEENT:  Head: Normocephalic Eyes:EOM are normal Neck: Normal range of motion Cardiovascular: Normal rate Pulmonary/chest: Effort normal Neurologic: Patient is alert Skin: Skin is warm Psychiatric: Patient has normal mood and affect   Ortho Exam: Ortho exam demonstrates full active and passive range of motion of the cervical spine.  5 out of 5 grip EPL FPL interosseous wrist flexion extension bicep triceps and deltoid strength.  Pedal pulses and radial pulses palpable.  No definite paresthesias C5-T1 or L1-S1.  Reflexes normal without asymmetry bilateral upper and lower extremities.  No muscle atrophy in the arms or legs.  Right shoulder exam demonstrates pretty reasonable range of motion with no restriction of passive motion good rotator cuff strength and no AC joint tenderness.  Specialty Comments:  No specialty comments available.  Imaging: No results found.   PMFS History: Patient Active Problem List   Diagnosis Date Noted   Influenza vaccine refused 11/21/2020   COVID-19 vaccination declined 11/21/2020  CHF (congestive heart failure), NYHA class II, chronic, systolic (HCC) 11/21/2020   Tobacco dependence 11/21/2020   Cardiomyopathy (HCC) 08/28/2019   Chest pain of uncertain etiology 08/28/2019   Acute coronary syndrome (HCC) 04/06/2019   Tobacco use 04/06/2019   Hypokalemia 04/06/2019   Hypertensive urgency 12/14/2018   Essential hypertension 09/05/2018   Obstructive sleep apnea 09/05/2018   Morbid obesity (HCC) 09/05/2018   Prediabetes 09/05/2018   Past Medical History:  Diagnosis Date   CHF (congestive heart failure)  (HCC)    Hypertension    Morbid obesity (HCC)    Obstructive sleep apnea    CPAP   Tobacco use 04/06/2019    Family History  Problem Relation Age of Onset   Hypertension Mother    Heart failure Mother    Diabetes Maternal Grandmother    CAD Father 92       Heart attack and Stroke    Past Surgical History:  Procedure Laterality Date   NOSE SURGERY     Social History   Occupational History   Not on file  Tobacco Use   Smoking status: Every Day    Packs/day: 0.25    Types: Cigarettes   Smokeless tobacco: Never   Tobacco comments:    5 cigs a day  Vaping Use   Vaping Use: Never used  Substance and Sexual Activity   Alcohol use: Yes    Comment: occ   Drug use: No   Sexual activity: Yes

## 2021-11-17 ENCOUNTER — Other Ambulatory Visit: Payer: Self-pay

## 2021-11-17 ENCOUNTER — Ambulatory Visit
Admission: RE | Admit: 2021-11-17 | Discharge: 2021-11-17 | Disposition: A | Discharge status: Home / Self Care | Payer: BC Managed Care – PPO | Source: Ambulatory Visit | Attending: Physical Medicine and Rehabilitation | Admitting: Physical Medicine and Rehabilitation

## 2021-11-17 ENCOUNTER — Encounter: Payer: Self-pay | Admitting: Internal Medicine

## 2021-11-17 DIAGNOSIS — M542 Cervicalgia: Secondary | ICD-10-CM

## 2021-11-17 DIAGNOSIS — M47812 Spondylosis without myelopathy or radiculopathy, cervical region: Secondary | ICD-10-CM | POA: Diagnosis not present

## 2021-11-17 MED ORDER — IOPAMIDOL (ISOVUE-M 300) INJECTION 61%
1.0000 mL | Freq: Once | INTRAMUSCULAR | Status: AC | PRN
  Administered 2021-11-17: 1 mL via EPIDURAL

## 2021-11-17 MED ORDER — TRIAMCINOLONE ACETONIDE 40 MG/ML IJ SUSP (RADIOLOGY)
60.0000 mg | Freq: Once | INTRAMUSCULAR | Status: AC
  Administered 2021-11-17: 60 mg via EPIDURAL

## 2021-11-17 NOTE — Discharge Instructions (Signed)

## 2021-11-17 NOTE — Telephone Encounter (Signed)
Pt called and made an appt  first one available was 01/09/22 at 4:10

## 2021-11-19 ENCOUNTER — Encounter: Payer: Self-pay | Admitting: Cardiology

## 2021-11-19 DIAGNOSIS — I10 Essential (primary) hypertension: Secondary | ICD-10-CM

## 2021-11-20 MED ORDER — CLONIDINE 0.3 MG/24HR TD PTWK: 0.3000 mg | MEDICATED_PATCH | TRANSDERMAL | 0 refills | Status: DC

## 2021-11-20 MED ORDER — HYDROCHLOROTHIAZIDE 25 MG PO TABS: 25.0000 mg | ORAL_TABLET | Freq: Every morning | ORAL | 0 refills | Status: DC

## 2021-11-20 MED ORDER — IRBESARTAN 300 MG PO TABS: 300.0000 mg | ORAL_TABLET | Freq: Every day | ORAL | 0 refills | Status: DC

## 2021-11-20 MED ORDER — AMLODIPINE BESYLATE 10 MG PO TABS: 10.0000 mg | ORAL_TABLET | Freq: Every day | ORAL | 0 refills | Status: DC

## 2021-11-20 MED ORDER — SPIRONOLACTONE 50 MG PO TABS: 50.0000 mg | ORAL_TABLET | Freq: Every day | ORAL | 0 refills | Status: DC

## 2021-11-20 MED ORDER — LABETALOL HCL 300 MG PO TABS: 300.0000 mg | ORAL_TABLET | Freq: Two times a day (BID) | ORAL | 0 refills | Status: DC

## 2021-11-20 NOTE — Progress Notes (Signed)
Cardiology Clinic Note   Patient Name: Richard Hale Date of Encounter: 11/24/2021  Primary Care Provider:  Ladell Pier, MD Primary Cardiologist:  Minus Breeding, MD  Patient Profile    Richard Hale 36 year old male presents to the clinic today for follow-up evaluation of his CHF and hypertension.  Past Medical History    Past Medical History:  Diagnosis Date   CHF (congestive heart failure) ([place])    Hypertension    Morbid obesity ([place])    Obstructive sleep apnea    CPAP   Tobacco use 04/06/2019   Past Surgical History:  Procedure Laterality Date   NOSE SURGERY      Allergies  Allergies  Allergen Reactions   Lisinopril Cough    History of Present Illness    Richard Hale has a PMH of  chronic systolic heart failure with an EF of 45%, OSA not on CPAP, hypertension, and medication noncompliance.  He was hospitalized in March with hypertensive urgency due to running out of his medication.  He was hospitalized again in June with hypertension and found to have a reduced EF.  He was last seen by Dr. Percival Spanish in clinic on 08/29/2019.  At that time he reported chest discomfort that was constant.  He reported having this chest discomfort in the hospital with a high-sensitivity troponin that was normal.  Chest pressure was felt to be atypical.  Hochrein suspected uncontrolled hypertension at that time.  That visit he was maintained on 10 mg of amlodipine, 25 mg HCTZ, 300 mg irbesartan, and labetalol twice daily, 50 mg spironolactone.  He was given 0.1 mg clonidine in the office.   He is was seen via virtual platform on 09/27/2019 and stated that his chest pain was better.  This was chronic in nature and labile.  He noticed that it was more intense on some days and less intense on other days, describing it as sharp pain under his left breast.  He was not able to bring on chest pain with physical activity and nothing relieved his chest pain.  He remaind very  physically active at work and walked for his entire shift.  He was doing  walks outside 1-2 times per week but due to Covid has discontinued his walking outside of work.  He had a exercise bike at his home that he planned to start using 15 minutes/day.  He continued to smoke 5 to 6 cigarettes/day which is down from 10-15 daily 1 month ago.  I sent him to the pharmacy for blood pressure evaluation, sent a blood pressure cuff to his home, gave him the salty 6 sheet, encouraged him to be more physically active, and sent him smoking cessation information.   He presented to see [name] on 10/03/2019.  At that time he was blood pressure was 180/120.  He was continued on labetalol, irbesartan, HCTZ, amlodipine, and spironolactone.  His medication dosing was changed to twice daily.  He was instructed to take his labetalol, irbesartan, and HCTZ around 1 AM at work and take his second dose of labetalol with amlodipine and spironolactone in the mornings after work.   He presented to the clinic 10/24/19 and stated he felt well. He had been exercising 30minutes daily after work. 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  He stated he was working on eating a lower sodium diet.  He also stated that he went to the emergency department on 10/17/2019 with complaints of  left-sided chest discomfort.  He waited in the emergency department but decided to leave because pain went away.  He  had no recurrent episodes of chest pain.  His blood pressure was better controlled today but still not at goal.  I increased his labetalol and planned follow up with the [place] clinic.   He followed up with Dr. Percival Spanish 06/06/2021.  He reported compliance with his medications.  He did note that his blood pressure was occasionally 190/112.  He continues to work night shift.  He denied shortness of breath, PND, orthopnea.  He denied palpitations, presyncope and syncope.  He denied chest pain, neck and back  discomfort.  He was not noted to have weight gain or loss and was not noted to have lower extremity swelling.  He was unable to use his CPAP due to being a stomach sleeper.  He was prescribed clonidine patch 0.3 mg - 24 hours.  He presents to the clinic today for follow-up evaluation states he feels well.  He reports that he did not take his blood pressure medication this morning.  He started wearing his blood pressure clonidine patch on Saturday.  He has noticed better blood pressure control at home with blood pressures in the 120-1 130/60-80 range.  We reviewed the importance of heart healthy diet, low-sodium, physical activity, smoking cessation.  He reports that he lives smoking less and has increased stress at work is working normally smokes.  His blood pressure in the clinic today before taking his blood pressure medication was 150s over 80s.  I will have him maintain a blood pressure log, continue his current blood pressure medication, increase his physical activity as tolerated, and continue to try to lose weight.  He has a appointment with his PCP in March to discuss options of weight loss and possibly bariatric surgery.  We will plan follow-up for 6 months.  Today he denies  shortness of breath, lower extremity edema, fatigue, palpitations, melena, hematuria, hemoptysis, diaphoresis, weakness, presyncope, syncope, orthopnea, and PND.     Home Medications    Prior to Admission medications   Medication Sig Start Date End Date Taking? Authorizing Provider  amLODipine (NORVASC) 10 MG tablet Take 1 tablet (10 mg total) by mouth daily. 11/20/21   Minus Breeding, MD  buPROPion (WELLBUTRIN SR) 150 MG 12 hr tablet Take 1 tablet (150 mg total) by mouth 2 (two) times daily. Patient not taking: Reported on 06/06/2021 11/21/20   Ladell Pier, MD  cloNIDine (CATAPRES - DOSED IN MG/24 HR) 0.3 mg/24hr patch Place 1 patch (0.3 mg total) onto the skin once a week. 11/20/21   Minus Breeding, MD  cloNIDine  (CATAPRES) 0.1 MG tablet Take 1 tablet (0.1 mg total) by mouth every 8 (eight) hours as needed. 06/06/21   Minus Breeding, MD  cyclobenzaprine (FLEXERIL) 10 MG tablet Take 1 tablet (10 mg total) by mouth 2 (two) times daily as needed for muscle spasms. Patient not taking: Reported on 06/06/2021 11/21/20   Ladell Pier, MD  hydrochlorothiazide (HYDRODIURIL) 25 MG tablet Take 1 tablet (25 mg total) by mouth every morning. 11/20/21   Minus Breeding, MD  irbesartan (AVAPRO) 300 MG tablet Take 1 tablet (300 mg total) by mouth daily. 11/20/21   Minus Breeding, MD  labetalol (NORMODYNE) 300 MG tablet Take 1 tablet (300 mg total) by mouth 2 (two) times daily. 11/20/21   Minus Breeding, MD  pantoprazole (PROTONIX) 40 MG tablet Take 1 tablet (40 mg total) by mouth 2 (two) times  daily. To reduce stomach acid 08/16/19   Fulp, Cammie, MD  predniSONE (DELTASONE) 10 MG tablet Take 1 tablet (10 mg total) by mouth daily with breakfast. Patient not taking: Reported on 06/06/2021 11/21/20   Ladell Pier, MD  spironolactone (ALDACTONE) 50 MG tablet Take 1 tablet (50 mg total) by mouth daily. 11/20/21   Minus Breeding, MD    Family History    Family History  Problem Relation Age of Onset   Hypertension Mother    Heart failure Mother    Diabetes Maternal Grandmother    CAD Father 67       Heart attack and Stroke   He indicated that his mother is alive. He indicated that his father is deceased. He indicated that the status of his maternal grandmother is unknown.  Social History    Social History   Socioeconomic History   Marital status: Married    Spouse name: Not on file   Number of children: Not on file   Years of education: Not on file   Highest education level: Not on file  Occupational History   Not on file  Tobacco Use   Smoking status: Every Day    Packs/day: 0.25    Types: Cigarettes   Smokeless tobacco: Never   Tobacco comments:    5 cigs a day  Vaping Use   Vaping Use: Never used   Substance and Sexual Activity   Alcohol use: Yes    Comment: occ   Drug use: No   Sexual activity: Yes  Other Topics Concern   Not on file  Social History Narrative   Lives with girlfriend and her kids.  He has three children.  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     Social Determinants of Health   Financial Resource Strain: Not on file  Food Insecurity: Not on file  Transportation Needs: Not on file  Physical Activity: Not on file  Stress: Not on file  Social Connections: Not on file  Intimate Partner Violence: Not on file     Review of Systems    General:  No chills, fever, night sweats or weight changes.  Cardiovascular:  No chest pain, dyspnea on exertion, edema, orthopnea, palpitations, paroxysmal nocturnal dyspnea. Dermatological: No rash, lesions/masses Respiratory: No cough, dyspnea Urologic: No hematuria, dysuria Abdominal:   No nausea, vomiting, diarrhea, bright red blood per rectum, melena, or hematemesis Neurologic:  No visual changes, wkns, changes in mental status. All other systems reviewed and are otherwise negative except as noted above.  Physical Exam    VS:  BP (!) 152/85    Pulse 88    Ht 6\' 2"  (1.88 m)    Wt (!) 406 lb 3.2 oz (184.3 kg)    BMI 52.15 kg/m  , BMI Body mass index is 52.15 kg/m. GEN: Well nourished, well developed, in no acute distress. HEENT: normal. Neck: Supple, no JVD, carotid bruits, or masses. Cardiac: RRR, no murmurs, rubs, or gallops. No clubbing, cyanosis, edema.  Radials/DP/PT 2+ and equal bilaterally.  Respiratory:  Respirations regular and unlabored, clear to auscultation bilaterally. GI: Soft, nontender, nondistended, BS + x 4. MS: no deformity or atrophy. Skin: warm and dry, no rash. Neuro:  Strength and sensation are intact. Psych: Normal affect.  Accessory Clinical Findings    Recent Labs: No results found for requested labs within last 8760 hours.   Recent Lipid Panel    Component Value Date/Time   CHOL 129 11/21/2020  1152   TRIG 139 11/21/2020  1152   HDL 49 11/21/2020 1152   CHOLHDL 2.6 11/21/2020 1152   CHOLHDL 3.1 04/07/2019 0856   VLDL 29 04/07/2019 0856   LDLCALC 56 11/21/2020 1152    ECG personally reviewed by me today-normal sinus rhythm nonspecific intraventricular block T wave abnormality consider lateral ischemia 88 bpm- No acute changes  Echocardiogram 04/07/2019  IMPRESSIONS      1. The left ventricle has a visually estimated ejection fraction of 45%. The cavity size was mildly dilated. There is moderately increased left ventricular wall thickness. Left ventricular diastolic Doppler parameters are consistent with  pseudonormalization. Left ventrical global hypokinesis without regional wall motion abnormalities.  2. The right ventricle has normal systolic function. The cavity was normal. There is no increase in right ventricular wall thickness.  3. No evidence of mitral valve stenosis. No mitral regurgitation.  4. The aortic valve is tricuspid. No stenosis of the aortic valve.  5. The aortic root is normal in size and structure. No complete TR doppler jet so unable to estimate PA systolic pressure.  Assessment & Plan   1.  Hypertension- blood pressure today 152/85.  Has obtained a home blood pressure cuff.  Better controlled at home 120-130 60-80Continue 10 mg of amlodipine Continue 25 mg HCTZ Continue 300 mg irbesartan Increase labetalol 300 mg twice daily Continue 50 mg spironolactone daily Clonidine patch 0.3 mg / 24 hours Heart healthy low-sodium diet-salty 6 given Increase physical activity as tolerated Continue weight loss Patient to present to pharmacy in 1 month for blood pressure evaluation Maintain blood pressure log.   Cardiomyopathy/Chronic systolic heart failure-weight stable.  Euvolemic.  No increased activity intolerance or dyspnea on exertion today, EF 45%.Suspect this is related to his uncontrolled hypertension Continue 10 mg of amlodipine, 25 mg HCTZ, irbesartan,  labetalol ,spironolactone daily Heart healthy low-sodium diet-salty 6 given Increase physical activity as tolerated Continue weight loss   Chest pain-none  today.  Chronic in nature.   Atypical, high-sensitivity troponin was negative while having chest discomfort. Continue to monitor  Morbid obesity-weight today 380 pounds fully clothed.  Encouraged wearing the same time each day after voiding. Heart healthy low-sodium diet Increase physical activity Continue weight loss    Tobacco use-currently using 4-5 cigarettes/day which is down from 10 to 15 cigarettes 1 month ago. Previous reduction in tobacco use Smoking cessation handout given   OSA-compliant with CPAP Continue weight loss Continue CPAP use   Disposition: Follow-up in 1 month with clinical pharmacist/hypertension clinic.  And with me in 2-3 months.  Jossie Ng. Burle Kwan NP-C    11/24/2021, 10:59 AM [place] [address] [phone] Fax [phone]  Notice: This dictation was prepared with Dragon dictation along with smaller phrase technology. Any transcriptional errors that result from this process are unintentional and may not be corrected upon review.  I spent 14 minutes examining this patient, reviewing medications, and using patient centered shared decision making involving her cardiac care.  Prior to her visit I spent greater than 20 minutes reviewing her past medical history,  medications, and prior cardiac tests.

## 2021-11-24 ENCOUNTER — Other Ambulatory Visit: Payer: Self-pay

## 2021-11-24 ENCOUNTER — Encounter: Payer: Self-pay | Admitting: General Practice

## 2021-11-24 ENCOUNTER — Ambulatory Visit (INDEPENDENT_AMBULATORY_CARE_PROVIDER_SITE_OTHER): Payer: BC Managed Care – PPO | Admitting: General Practice

## 2021-11-24 VITALS — BP 152/85 | HR 88 | Ht 74.0 in | Wt >= 6400 oz

## 2021-11-24 DIAGNOSIS — I429 Cardiomyopathy, unspecified: Secondary | ICD-10-CM

## 2021-11-24 DIAGNOSIS — R072 Precordial pain: Secondary | ICD-10-CM | POA: Diagnosis not present

## 2021-11-24 DIAGNOSIS — I10 Essential (primary) hypertension: Secondary | ICD-10-CM

## 2021-11-24 DIAGNOSIS — Z720 Tobacco use: Secondary | ICD-10-CM

## 2021-11-24 DIAGNOSIS — G4733 Obstructive sleep apnea (adult) (pediatric): Secondary | ICD-10-CM

## 2021-11-24 DIAGNOSIS — Z79899 Other long term (current) drug therapy: Secondary | ICD-10-CM

## 2021-11-24 LAB — HEPATIC FUNCTION PANEL
ALT: 29 IU/L (ref 0–44)
AST: 30 IU/L (ref 0–40)
Albumin: 4.1 g/dL (ref 4.0–5.0)
Alkaline Phosphatase: 102 IU/L (ref 44–121)
Bilirubin Total: 0.2 mg/dL (ref 0.0–1.2)
Bilirubin, Direct: 0.11 mg/dL (ref 0.00–0.40)
Total Protein: 7.5 g/dL (ref 6.0–8.5)

## 2021-11-24 LAB — LIPID PANEL
Chol/HDL Ratio: 2.2 ratio (ref 0.0–5.0)
Cholesterol, Total: 121 mg/dL (ref 100–199)
HDL: 54 mg/dL (ref >39)
LDL Chol Calc (NIH): 51 mg/dL (ref 0–99)
Triglycerides: 83 mg/dL (ref 0–149)
VLDL Cholesterol Cal: 16 mg/dL (ref 5–40)

## 2021-11-24 LAB — BASIC METABOLIC PANEL
BUN/Creatinine Ratio: 16 (ref 9–20)
BUN: 17 mg/dL (ref 6–20)
CO2: 26 mmol/L (ref 20–29)
Calcium: 9.5 mg/dL (ref 8.7–10.2)
Chloride: 102 mmol/L (ref 96–106)
Creatinine, Ser: 1.07 mg/dL (ref 0.76–1.27)
Glucose: 98 mg/dL (ref 70–99)
Potassium: 4.5 mmol/L (ref 3.5–5.2)
Sodium: 141 mmol/L (ref 134–144)
eGFR: 93 mL/min/{1.73_m2} (ref >59)

## 2021-11-24 LAB — CBC
Hematocrit: 45.1 % (ref 37.5–51.0)
Hemoglobin: 15.4 g/dL (ref 13.0–17.7)
MCH: 29.4 pg (ref 26.6–33.0)
MCHC: 34.1 g/dL (ref 31.5–35.7)
MCV: 86 fL (ref 79–97)
Platelets: 388 10*3/uL (ref 150–450)
RBC: 5.23 x10E6/uL (ref 4.14–5.80)
RDW: 12.3 % (ref 11.6–15.4)
WBC: 6.5 10*3/uL (ref 3.4–10.8)

## 2021-11-24 NOTE — Progress Notes (Signed)
Cardiology Clinic Note   Patient Name: Richard Hale Date of Encounter: 11/24/2021  Primary Care Provider:  Ladell Pier, MD Primary Cardiologist:  Minus Breeding, MD  Patient Profile    Richard Hale 36 year old male presents to the clinic today for follow-up evaluation of his CHF and hypertension.  Past Medical History    Past Medical History:  Diagnosis Date   CHF (congestive heart failure) ([place])    Hypertension    Morbid obesity ([place])    Obstructive sleep apnea    CPAP   Tobacco use 04/06/2019   Past Surgical History:  Procedure Laterality Date   NOSE SURGERY      Allergies  Allergies  Allergen Reactions   Lisinopril Cough    History of Present Illness    Richard Hale has a PMH of  chronic systolic heart failure with an EF of 45%, OSA not on CPAP, hypertension, and medication noncompliance.  He was hospitalized in March with hypertensive urgency due to running out of his medication.  He was hospitalized again in June with hypertension and found to have a reduced EF.  He was last seen by Dr. Percival Spanish in clinic on 08/29/2019.  At that time he reported chest discomfort that was constant.  He reported having this chest discomfort in the hospital with a high-sensitivity troponin that was normal.  Chest pressure was felt to be atypical.  Hochrein suspected uncontrolled hypertension at that time.  That visit he was maintained on 10 mg of amlodipine, 25 mg HCTZ, 300 mg irbesartan, and labetalol twice daily, 50 mg spironolactone.  He was given 0.1 mg clonidine in the office.   He is was seen via virtual platform on 09/27/2019 and stated that his chest pain was better.  This was chronic in nature and labile.  He noticed that it was more intense on some days and less intense on other days, describing it as sharp pain under his left breast.  He was not able to bring on chest pain with physical activity and nothing relieved his chest pain.  He remaind very  physically active at work and walked for his entire shift.  He was doing  walks outside 1-2 times per week but due to Covid has discontinued his walking outside of work.  He had a exercise bike at his home that he planned to start using 15 minutes/day.  He continued to smoke 5 to 6 cigarettes/day which is down from 10-15 daily 1 month ago.  I sent him to the pharmacy for blood pressure evaluation, sent a blood pressure cuff to his home, gave him the salty 6 sheet, encouraged him to be more physically active, and sent him smoking cessation information.   He presented to see [name] pharmacist on 10/03/2019.  At that time he was blood pressure was 180/120.  He was continued on labetalol, irbesartan, HCTZ, amlodipine, and spironolactone.  His medication dosing was changed to twice daily.  He was instructed to take his labetalol, irbesartan, and HCTZ around 1 AM at work and take his second dose of labetalol with amlodipine and spironolactone in the mornings after work.   He presented to the clinic 10/24/19 and stated he felt well. He had been exercising 74minutes daily after work. 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  He stated he was working on eating a lower sodium diet.  He also stated that he went to the emergency department on 10/17/2019 with complaints of  left-sided chest discomfort.  He waited in the emergency department but decided to leave because pain went away.  He  had no recurrent episodes of chest pain.  His blood pressure was better controlled today but still not at goal.  I increased his labetalol and planned follow up with the [facility].   He followed up with Dr. Percival Spanish 06/06/2021.  He reported compliance with his medications.  He did note that his blood pressure was occasionally 190/112.  He continues to work night shift.  He denied shortness of breath, PND, orthopnea.  He denied palpitations, presyncope and syncope.  He denied chest pain, neck and back  discomfort.  He was not noted to have weight gain or loss and was not noted to have lower extremity swelling.  He was unable to use his CPAP due to being a stomach sleeper.  He was prescribed clonidine patch 0.3 mg - 24 hours.  He presents to the clinic today for follow-up evaluation states he feels well.  He has not taken his blood pressure medication yet this morning.  He reports that he started using his clonidine patch on Saturday which she has not placed today.  He reports that at home his blood pressures have been well controlled in the 120-1 130/60-80 range.  He does notice that when his blood pressures are in the 120s over 60s that he does not feel well.  He continues to work night shift and reports compliance with his blood pressure medication.  We reviewed the importance of good blood pressure control, weight loss, physical activity, smoking cessation and diet.  He has a appointment scheduled with his PCP in March to review options for weight loss and possibly bariatric surgery.  I will continue his current medication regimen, give a blood pressure log, have him increase his physical activity as tolerated and plan follow-up for 4 to 6 months.  Today he denies  shortness of breath, lower extremity edema, fatigue, palpitations, melena, hematuria, hemoptysis, diaphoresis, weakness, presyncope, syncope, orthopnea, and PND.     Home Medications    Prior to Admission medications   Medication Sig Start Date End Date Taking? Authorizing Provider  amLODipine (NORVASC) 10 MG tablet Take 1 tablet (10 mg total) by mouth daily. 11/20/21   Minus Breeding, MD  buPROPion (WELLBUTRIN SR) 150 MG 12 hr tablet Take 1 tablet (150 mg total) by mouth 2 (two) times daily. Patient not taking: Reported on 06/06/2021 11/21/20   Ladell Pier, MD  cloNIDine (CATAPRES - DOSED IN MG/24 HR) 0.3 mg/24hr patch Place 1 patch (0.3 mg total) onto the skin once a week. 11/20/21   Minus Breeding, MD  cloNIDine (CATAPRES) 0.1  MG tablet Take 1 tablet (0.1 mg total) by mouth every 8 (eight) hours as needed. 06/06/21   Minus Breeding, MD  cyclobenzaprine (FLEXERIL) 10 MG tablet Take 1 tablet (10 mg total) by mouth 2 (two) times daily as needed for muscle spasms. Patient not taking: Reported on 06/06/2021 11/21/20   Ladell Pier, MD  hydrochlorothiazide (HYDRODIURIL) 25 MG tablet Take 1 tablet (25 mg total) by mouth every morning. 11/20/21   Minus Breeding, MD  irbesartan (AVAPRO) 300 MG tablet Take 1 tablet (300 mg total) by mouth daily. 11/20/21   Minus Breeding, MD  labetalol (NORMODYNE) 300 MG tablet Take 1 tablet (300 mg total) by mouth 2 (two) times daily. 11/20/21   Minus Breeding, MD  pantoprazole (PROTONIX) 40 MG tablet Take 1 tablet (40 mg total) by mouth 2 (  two) times daily. To reduce stomach acid 08/16/19   Fulp, Cammie, MD  predniSONE (DELTASONE) 10 MG tablet Take 1 tablet (10 mg total) by mouth daily with breakfast. Patient not taking: Reported on 06/06/2021 11/21/20   [name], MD  spironolactone (ALDACTONE) 50 MG tablet Take 1 tablet (50 mg total) by mouth daily. 11/20/21   Rollene Rotunda, MD    Family History    Family History  Problem Relation Age of Onset   Hypertension Mother    Heart failure Mother    Diabetes Maternal Grandmother    CAD Father 89       Heart attack and Stroke   He indicated that his mother is alive. He indicated that his father is deceased. He indicated that the status of his maternal grandmother is unknown.  Social History    Social History   Socioeconomic History   Marital status: Married    Spouse name: Not on file   Number of children: Not on file   Years of education: Not on file   Highest education level: Not on file  Occupational History   Not on file  Tobacco Use   Smoking status: Every Day    Packs/day: 0.25    Types: Cigarettes   Smokeless tobacco: Never   Tobacco comments:    5 cigs a day  Vaping Use   Vaping Use: Never used  Substance and  Sexual Activity   Alcohol use: Yes    Comment: occ   Drug use: No   Sexual activity: Yes  Other Topics Concern   Not on file  Social History Narrative   Lives with girlfriend and her kids.  He has three children.  Works at a [employer] broadcast assistant.     Social Determinants of Health   Financial Resource Strain: Not on file  Food Insecurity: Not on file  Transportation Needs: Not on file  Physical Activity: Not on file  Stress: Not on file  Social Connections: Not on file  Intimate Partner Violence: Not on file     Review of Systems    General:  No chills, fever, night sweats or weight changes.  Cardiovascular:  No chest pain, dyspnea on exertion, edema, orthopnea, palpitations, paroxysmal nocturnal dyspnea. Dermatological: No rash, lesions/masses Respiratory: No cough, dyspnea Urologic: No hematuria, dysuria Abdominal:   No nausea, vomiting, diarrhea, bright red blood per rectum, melena, or hematemesis Neurologic:  No visual changes, wkns, changes in mental status. All other systems reviewed and are otherwise negative except as noted above.  Physical Exam    VS:  BP (!) 152/85    Pulse 88    Ht 6\' 2"  (1.88 m)    Wt (!) 406 lb 3.2 oz (184.3 kg)    BMI 52.15 kg/m  , BMI Body mass index is 52.15 kg/m. GEN: Well nourished, well developed, in no acute distress. HEENT: normal. Neck: Supple, no JVD, carotid bruits, or masses. Cardiac: RRR, no murmurs, rubs, or gallops. No clubbing, cyanosis, edema.  Radials/DP/PT 2+ and equal bilaterally.  Respiratory:  Respirations regular and unlabored, clear to auscultation bilaterally. GI: Soft, nontender, nondistended, BS + x 4. MS: no deformity or atrophy. Skin: warm and dry, no rash. Neuro:  Strength and sensation are intact. Psych: Normal affect.  Accessory Clinical Findings    Recent Labs: No results found for requested labs within last 8760 hours.   Recent Lipid Panel    Component Value Date/Time   CHOL 129 11/21/2020 1152   TRIG 139  11/21/2020 1152   HDL 49 11/21/2020 1152   CHOLHDL 2.6 11/21/2020 1152   CHOLHDL 3.1 04/07/2019 0856   VLDL 29 04/07/2019 0856   LDLCALC 56 11/21/2020 1152    ECG personally reviewed by me today-normal sinus rhythm nonspecific intraventricular block, T wave abnormality consider lateral ischemia 88 bpm- No acute changes  Echocardiogram 04/07/2019  IMPRESSIONS      1. The left ventricle has a visually estimated ejection fraction of 45%. The cavity size was mildly dilated. There is moderately increased left ventricular wall thickness. Left ventricular diastolic Doppler parameters are consistent with  pseudonormalization. Left ventrical global hypokinesis without regional wall motion abnormalities.  2. The right ventricle has normal systolic function. The cavity was normal. There is no increase in right ventricular wall thickness.  3. No evidence of mitral valve stenosis. No mitral regurgitation.  4. The aortic valve is tricuspid. No stenosis of the aortic valve.  5. The aortic root is normal in size and structure. No complete TR doppler jet so unable to estimate PA systolic pressure.  Assessment & Plan   1.  Hypertension- blood pressure today 152/85  Has obtained a home blood pressure cuff.  Better controlled at home 120-130's over 60-80s. Continue 10 mg of amlodipine Continue 25 mg HCTZ Continue 300 mg irbesartan Continue labetalol 300 mg twice daily Continue 50 mg spironolactone daily Clonidine patch 0.3 mg / 24 hours Heart healthy low-sodium diet-salty 6 given Increase physical activity as tolerated Continue weight loss Continue blood pressure log.   Cardiomyopathy/Chronic systolic heart failure-weight stable.  Euvolemic.  No increased activity intolerance or dyspnea on exertion today, EF 45%.Suspect this is related to his uncontrolled hypertension Continue 10 mg of amlodipine, 25 mg HCTZ, irbesartan, labetalol ,spironolactone daily Heart healthy low-sodium diet-salty 6  given Increase physical activity as tolerated Continue weight loss   Chest pain-none  today.  Chronic in nature.   Atypical, high-sensitivity troponin was negative while having chest discomfort. Continue to monitor  Morbid obesity-weight today 380 pounds fully clothed.  Encouraged wearing the same time each day after voiding. Heart healthy low-sodium diet Increase physical activity Continue weight loss    Tobacco use-currently using 5-6 cigarettes/day which is down from 10 to 15 cigarettes 1 month ago. Previous reduction in tobacco use Smoking cessation reviewed/stressed   OSA-compliant with CPAP Continue weight loss Continue CPAP use   Disposition: Follow-up with Dr. Percival Spanish in 4-6 months.  Jossie Ng. Samreen Seltzer NP-C    11/24/2021, 10:16 AM [address] Office [phone] Fax [phone]  Notice: This dictation was prepared with Dragon dictation along with smaller phrase technology. Any transcriptional errors that result from this process are unintentional and may not be corrected upon review.  I spent 14 minutes examining this patient, reviewing medications, and using patient centered shared decision making involving her cardiac care.  Prior to her visit I spent greater than 20 minutes reviewing her past medical history,  medications, and prior cardiac tests.

## 2021-11-24 NOTE — Patient Instructions (Signed)
Medication Instructions:  The current medical regimen is effective;  continue present plan and medications as directed. Please refer to the Current Medication list given to you today.   *If you need a refill on your cardiac medications before your next appointment, please call your pharmacy*  Lab Work:    FASTING LIPID, LFT,BMET AND CBC     Special Instructions PLEASE TAKE AND LOG YOUR BLOOD PRESSURE CALL IF >130  Follow-Up: Your next appointment:  4-6 month(s) In Person with Rollene Rotunda, MD  Please call our office 2 months in advance to schedule this appointment  :1  At [facility], you and your health needs are our priority.  As part of our continuing mission to provide you with exceptional heart care, we have created designated Provider Care Teams.  These Care Teams include your primary Cardiologist (physician) and Advanced Practice Providers (APPs -  Physician Assistants and Nurse Practitioners) who all work together to provide you with the care you need, when you need it.

## 2021-12-30 ENCOUNTER — Other Ambulatory Visit: Payer: Self-pay | Admitting: Cardiology

## 2021-12-30 DIAGNOSIS — I10 Essential (primary) hypertension: Secondary | ICD-10-CM

## 2021-12-31 MED ORDER — AMLODIPINE BESYLATE 10 MG PO TABS: 10.0000 mg | ORAL_TABLET | Freq: Every day | ORAL | 5 refills | Status: DC

## 2021-12-31 MED ORDER — IRBESARTAN 300 MG PO TABS: 300.0000 mg | ORAL_TABLET | Freq: Every day | ORAL | 5 refills | Status: DC

## 2021-12-31 MED ORDER — HYDROCHLOROTHIAZIDE 25 MG PO TABS: 25.0000 mg | ORAL_TABLET | Freq: Every morning | ORAL | 5 refills | Status: DC

## 2021-12-31 MED ORDER — SPIRONOLACTONE 50 MG PO TABS: 50.0000 mg | ORAL_TABLET | Freq: Every day | ORAL | 5 refills | Status: DC

## 2021-12-31 MED ORDER — LABETALOL HCL 300 MG PO TABS: 300.0000 mg | ORAL_TABLET | Freq: Two times a day (BID) | ORAL | 5 refills | Status: DC

## 2021-12-31 MED ORDER — CLONIDINE 0.3 MG/24HR TD PTWK: 0.3000 mg | MEDICATED_PATCH | TRANSDERMAL | 5 refills | Status: DC

## 2022-01-01 ENCOUNTER — Other Ambulatory Visit: Payer: Self-pay

## 2022-01-01 ENCOUNTER — Encounter: Payer: Self-pay | Admitting: Internal Medicine

## 2022-01-01 ENCOUNTER — Ambulatory Visit: Payer: BC Managed Care – PPO | Attending: Internal Medicine | Admitting: Internal Medicine

## 2022-01-01 VITALS — BP 181/133 | HR 79 | Resp 16 | Wt >= 6400 oz

## 2022-01-01 DIAGNOSIS — I5022 Chronic systolic (congestive) heart failure: Secondary | ICD-10-CM

## 2022-01-01 DIAGNOSIS — I10 Essential (primary) hypertension: Secondary | ICD-10-CM | POA: Diagnosis not present

## 2022-01-01 DIAGNOSIS — G4733 Obstructive sleep apnea (adult) (pediatric): Secondary | ICD-10-CM

## 2022-01-01 DIAGNOSIS — Z9989 Dependence on other enabling machines and devices: Secondary | ICD-10-CM

## 2022-01-01 DIAGNOSIS — F17200 Nicotine dependence, unspecified, uncomplicated: Secondary | ICD-10-CM

## 2022-01-01 MED ORDER — VARENICLINE TARTRATE 0.5 MG X 11 & 1 MG X 42 PO TBPK
ORAL_TABLET | ORAL | 0 refills | Status: DC
Start: 2022-01-01 — End: 2022-07-04

## 2022-01-01 NOTE — Patient Instructions (Signed)
Healthy Eating ?Following a healthy eating pattern may help you to achieve and maintain a healthy body weight, reduce the risk of chronic disease, and live a long and productive life. It is important to follow a healthy eating pattern at an appropriate calorie level for your body. Your nutritional needs should be met primarily through food by choosing a variety of nutrient-rich foods. ?What are tips for following this plan? ?Reading food labels ?Read labels and choose the following: ?Reduced or low sodium. ?Juices with 100% fruit juice. ?Foods with low saturated fats and high polyunsaturated and monounsaturated fats. ?Foods with whole grains, such as whole wheat, cracked wheat, brown rice, and wild rice. ?Whole grains that are fortified with folic acid. This is recommended for women who are pregnant or who want to become pregnant. ?Read labels and avoid the following: ?Foods with a lot of added sugars. These include foods that contain brown sugar, corn sweetener, corn syrup, dextrose, fructose, glucose, high-fructose corn syrup, honey, invert sugar, lactose, malt syrup, maltose, molasses, raw sugar, sucrose, trehalose, or turbinado sugar. ?Do not eat more than the following amounts of added sugar per day: ?6 teaspoons (25 g) for women. ?9 teaspoons (38 g) for men. ?Foods that contain processed or refined starches and grains. ?Refined grain products, such as [place] flour, degermed cornmeal, Gruen bread, and Auth rice. ?Shopping ?Choose nutrient-rich snacks, such as vegetables, whole fruits, and nuts. Avoid high-calorie and high-sugar snacks, such as potato chips, fruit snacks, and candy. ?Use oil-based dressings and spreads on foods instead of solid fats such as butter, stick margarine, or cream cheese. ?Limit pre-made sauces, mixes, and "instant" products such as flavored rice, instant noodles, and ready-made pasta. ?Try more plant-protein sources, such as tofu, tempeh, black beans, edamame, lentils, nuts, and  seeds. ?Explore eating plans such as the Mediterranean diet or vegetarian diet. ?Cooking ?Use oil to saut? or stir-fry foods instead of solid fats such as butter, stick margarine, or lard. ?Try baking, boiling, grilling, or broiling instead of frying. ?Remove the fatty part of meats before cooking. ?Steam vegetables in water or broth. ?Meal planning ? ?At meals, imagine dividing your plate into fourths: ?One-half of your plate is fruits and vegetables. ?One-fourth of your plate is whole grains. ?One-fourth of your plate is protein, especially lean meats, poultry, eggs, tofu, beans, or nuts. ?Include low-fat dairy as part of your daily diet. ?Lifestyle ?Choose healthy options in all settings, including home, work, school, restaurants, or stores. ?Prepare your food safely: ?Wash your hands after handling raw meats. ?Keep food preparation surfaces clean by regularly washing with hot, soapy water. ?Keep raw meats separate from ready-to-eat foods, such as fruits and vegetables. ?Cook seafood, meat, poultry, and eggs to the recommended internal temperature. ?Store foods at safe temperatures. In general: ?Keep cold foods at 40?F (4.4?C) or below. ?Keep hot foods at 140?F (60?C) or above. ?Keep your freezer at 0?F (-17.8?C) or below. ?Foods are no longer safe to eat when they have been between the temperatures of 40?-140?F (4.4-60?C) for more than 2 hours. ?What foods should I eat? ?Fruits ?Aim to eat 2 cup-equivalents of fresh, canned (in natural juice), or frozen fruits each day. Examples of 1 cup-equivalent of fruit include 1 small apple, 8 large strawberries, 1 cup canned fruit, ? cup dried fruit, or 1 cup 100% juice. ?Vegetables ?Aim to eat 2?-3 cup-equivalents of fresh and frozen vegetables each day, including different varieties and colors. Examples of 1 cup-equivalent of vegetables include 2 medium carrots, 2 cups raw,  leafy greens, 1 cup chopped vegetable (raw or cooked), or 1 medium baked potato. ?Grains ?Aim to  eat 6 ounce-equivalents of whole grains each day. Examples of 1 ounce-equivalent of grains include 1 slice of bread, 1 cup ready-to-eat cereal, 3 cups popcorn, or ? cup cooked rice, pasta, or cereal. ?Meats and other proteins ?Aim to eat 5-6 ounce-equivalents of protein each day. Examples of 1 ounce-equivalent of protein include 1 egg, 1/2 cup nuts or seeds, or 1 tablespoon (16 g) peanut butter. A cut of meat or fish that is the size of a deck of cards is about 3-4 ounce-equivalents. ?Of the protein you eat each week, try to have at least 8 ounces come from seafood. This includes salmon, trout, herring, and anchovies. ?Dairy ?Aim to eat 3 cup-equivalents of fat-free or low-fat dairy each day. Examples of 1 cup-equivalent of dairy include 1 cup (240 mL) milk, 8 ounces (250 g) yogurt, 1? ounces (44 g) natural cheese, or 1 cup (240 mL) fortified soy milk. ?Fats and oils ?Aim for about 5 teaspoons (21 g) per day. Choose monounsaturated fats, such as canola and olive oils, avocados, peanut butter, and most nuts, or polyunsaturated fats, such as sunflower, corn, and soybean oils, walnuts, pine nuts, sesame seeds, sunflower seeds, and flaxseed. ?Beverages ?Aim for six 8-oz glasses of water per day. Limit coffee to three to five 8-oz cups per day. ?Limit caffeinated beverages that have added calories, such as soda and energy drinks. ?Limit alcohol intake to no more than 1 drink a day for nonpregnant women and 2 drinks a day for men. One drink equals 12 oz of beer (355 mL), 5 oz of wine (148 mL), or 1? oz of hard liquor (44 mL). ?Seasoning and other foods ?Avoid adding excess amounts of salt to your foods. Try flavoring foods with herbs and spices instead of salt. ?Avoid adding sugar to foods. ?Try using oil-based dressings, sauces, and spreads instead of solid fats. ?This information is based on general [place] nutrition guidelines. For more information, visit [employer]. Exact amounts may vary based on your nutrition  needs. ?Summary ?A healthy eating plan may help you to maintain a healthy weight, reduce the risk of chronic diseases, and stay active throughout your life. ?Plan your meals. Make sure you eat the right portions of a variety of nutrient-rich foods. ?Try baking, boiling, grilling, or broiling instead of frying. ?Choose healthy options in all settings, including home, work, school, restaurants, or stores. ?This information is not intended to replace advice given to you by your health care provider. Make sure you discuss any questions you have with your health care provider. ?Document Revised: 05/27/2021 Document Reviewed: 05/27/2021 ?Elsevier Patient Education ? [place]. ? ?

## 2022-01-01 NOTE — Progress Notes (Signed)
? ? ?Patient ID: Richard Hale, male    DOB: 1986/04/23  MRN: [id number] ? ?CC: Weight Loss ? ? ?Subjective: ?Richard Hale is a 36 y.o. male who presents for concerns about weight ?His concerns today include:  ?Patient with history of HTN, preDM, cardiomyopathy with EF 45% in 2020, OSA on CPAP, morbid obesity, tob dep ? ?Obesity: Patient is wanting to be referred for weight loss surgery.   ?He has struggled with his weight for years. ?He tries to go to the gym and walk on the treadmill and does a little weight training.  He also plays basketball outside with his children.  He has problems staying consistent.  Reports the same thing with his diet.  He does well for period of time and then falls off the wagon.  Admits that he drinks a lot of sugary drinks including sodas, juices and sweet tea.  He has never met with a nutritionist for dietary counseling. ? ?HTN/cardiomyopathy: Blood pressure significantly elevated today.  States that he fell asleep before taking his medications today.  He has not taken any of them today as yet.   ?He is on clonidine patch, p.o. clonidine 0.1 mg every 8 hours, amlodipine 10 mg, HCTZ 25 mg, labetalol 300 mg twice a day, Avapro 300 mg daily, spironolactone 50 mg daily ?Checks BP QOD.  Lowest was 150/98 ?Limits salt ?No CP/SOB ? ?OSA on CPAP:  no using his CPAP consistently.  Uses it about 3-4 times a week.  The other days he falls asleep watching TV before he can put his CPAP on.  ? ?Tob dep: He has cut down on smoking.  Currently at 4-3 a day from 1/2 pk.  Wants to quit. ? ?Patient Active Problem List  ? Diagnosis Date Noted  ? Influenza vaccine refused 11/21/2020  ? COVID-19 vaccination declined 11/21/2020  ? CHF (congestive heart failure), NYHA class II, chronic, systolic ([place]) 85/46/2703  ? Tobacco dependence 11/21/2020  ? Cardiomyopathy ([place]) 08/28/2019  ? Chest pain of uncertain etiology 50/06/3817  ? Acute coronary syndrome ([place]) 04/06/2019  ? Tobacco use 04/06/2019  ?  Hypokalemia 04/06/2019  ? Hypertensive urgency 12/14/2018  ? Essential hypertension 09/05/2018  ? Obstructive sleep apnea 09/05/2018  ? Morbid obesity ([place]) 09/05/2018  ? Prediabetes 09/05/2018  ?  ? ?Current Outpatient Medications on File Prior to Visit  ?Medication Sig Dispense Refill  ? amLODipine (NORVASC) 10 MG tablet Take 1 tablet (10 mg total) by mouth daily. 30 tablet 5  ? buPROPion (WELLBUTRIN SR) 150 MG 12 hr tablet Take 1 tablet (150 mg total) by mouth 2 (two) times daily. (Patient not taking: Reported on 01/01/2022) 60 tablet 1  ? cloNIDine (CATAPRES - DOSED IN MG/24 HR) 0.3 mg/24hr patch Place 1 patch (0.3 mg total) onto the skin once a week. 4 patch 5  ? cloNIDine (CATAPRES) 0.1 MG tablet Take 1 tablet (0.1 mg total) by mouth every 8 (eight) hours as needed. 90 tablet 11  ? cyclobenzaprine (FLEXERIL) 10 MG tablet Take 1 tablet (10 mg total) by mouth 2 (two) times daily as needed for muscle spasms. (Patient not taking: Reported on 01/01/2022) 30 tablet 0  ? hydrochlorothiazide (HYDRODIURIL) 25 MG tablet Take 1 tablet (25 mg total) by mouth every morning. 30 tablet 5  ? irbesartan (AVAPRO) 300 MG tablet Take 1 tablet (300 mg total) by mouth daily. 30 tablet 5  ? labetalol (NORMODYNE) 300 MG tablet Take 1 tablet (300 mg total) by mouth 2 (two) times daily.  60 tablet 5  ? pantoprazole (PROTONIX) 40 MG tablet Take 1 tablet (40 mg total) by mouth 2 (two) times daily. To reduce stomach acid (Patient not taking: Reported on 01/01/2022) 60 tablet 4  ? predniSONE (DELTASONE) 10 MG tablet Take 1 tablet (10 mg total) by mouth daily with breakfast. (Patient not taking: Reported on 01/01/2022) 4 tablet 0  ? spironolactone (ALDACTONE) 50 MG tablet Take 1 tablet (50 mg total) by mouth daily. 30 tablet 5  ? ?Current Facility-Administered Medications on File Prior to Visit  ?Medication Dose Route Frequency Provider Last Rate Last Admin  ? cloNIDine (CATAPRES) tablet 0.1 mg  0.1 mg Oral Once Minus Breeding, MD      ?  cloNIDine (CATAPRES) tablet 0.1 mg  0.1 mg Oral Once Minus Breeding, MD      ? ? ?Allergies  ?Allergen Reactions  ? Lisinopril Cough  ? ? ?Social History  ? ?Socioeconomic History  ? Marital status: Married  ?  Spouse name: Not on file  ? Number of children: Not on file  ? Years of education: Not on file  ? Highest education level: Not on file  ?Occupational History  ? Not on file  ?Tobacco Use  ? Smoking status: Every Day  ?  Packs/day: 0.25  ?  Types: Cigarettes  ? Smokeless tobacco: Never  ? Tobacco comments:  ?  5 cigs a day  ?Vaping Use  ? Vaping Use: Never used  ?Substance and Sexual Activity  ? Alcohol use: Yes  ?  Comment: occ  ? Drug use: No  ? Sexual activity: Yes  ?Other Topics Concern  ? Not on file  ?Social History Narrative  ? Lives with girlfriend and her kids.  He has three children.  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    ? ?Social Determinants of Health  ? ?Financial Resource Strain: Not on file  ?Food Insecurity: Not on file  ?Transportation Needs: Not on file  ?Physical Activity: Not on file  ?Stress: Not on file  ?Social Connections: Not on file  ?Intimate Partner Violence: Not on file  ? ? ?Family History  ?Problem Relation Age of Onset  ? Hypertension Mother   ? Heart failure Mother   ? Diabetes Maternal Grandmother   ? CAD Father 42  ?     Heart attack and Stroke  ? ? ?Past Surgical History:  ?Procedure Laterality Date  ? NOSE SURGERY    ? ? ?ROS: ?Review of Systems ?Negative except as stated above ? ?PHYSICAL EXAM: ?BP (!) 181/133   Pulse 79   Resp 16   Wt (!) 406 lb 3.2 oz (184.3 kg)   SpO2 92%   BMI 52.15 kg/m?   ?Wt Readings from Last 3 Encounters:  ?01/01/22 (!) 406 lb 3.2 oz (184.3 kg)  ?11/24/21 (!) 406 lb 3.2 oz (184.3 kg)  ?06/06/21 (!) 397 lb 12.8 oz (180.4 kg)  ? ? ?Physical Exam ? ? ?General appearance - alert, well appearing, morbidly obese young African-American male and in no distress ?Mental status - normal mood, behavior, speech, dress, motor activity, and thought processes ?Chest  - clear to auscultation, no wheezes, rales or rhonchi, symmetric air entry ?Heart - normal rate, regular rhythm, normal S1, S2, no murmurs, rubs, clicks or gallops ?Extremities - peripheral pulses normal, no pedal edema, no clubbing or cyanosis ? ? ?  Latest Ref Rng & Units 11/24/2021  ? 10:43 AM 11/21/2020  ? 11:52 AM 05/18/2020  ? 11:02 AM  ?CMP  ?  Glucose 70 - 99 mg/dL 98   154   100    ?BUN 6 - 20 mg/dL 17   14   11     ?Creatinine 0.76 - 1.27 mg/dL 1.07   1.02   0.86    ?Sodium 134 - 144 mmol/L 141   141   139    ?Potassium 3.5 - 5.2 mmol/L 4.5   3.8   3.3    ?Chloride 96 - 106 mmol/L 102   102   103    ?CO2 20 - 29 mmol/L 26   22   23     ?Calcium 8.7 - 10.2 mg/dL 9.5   9.6   8.9    ?Total Protein 6.0 - 8.5 g/dL 7.5   7.4     ?Total Bilirubin 0.0 - 1.2 mg/dL 0.2   0.2     ?Alkaline Phos 44 - 121 IU/L 102   115     ?AST 0 - 40 IU/L 30   20     ?ALT 0 - 44 IU/L 29   17     ? ?Lipid Panel  ?   ?Component Value Date/Time  ? CHOL 121 11/24/2021 1043  ? TRIG 83 11/24/2021 1043  ? HDL 54 11/24/2021 1043  ? CHOLHDL 2.2 11/24/2021 1043  ? CHOLHDL 3.1 04/07/2019 0856  ? VLDL 29 04/07/2019 0856  ? LDLCALC 51 11/24/2021 1043  ? ? ?CBC ?   ?Component Value Date/Time  ? WBC 6.5 11/24/2021 1043  ? WBC 9.4 10/17/2019 1126  ? RBC 5.23 11/24/2021 1043  ? RBC 4.79 10/17/2019 1126  ? HGB 15.4 11/24/2021 1043  ? HCT 45.1 11/24/2021 1043  ? PLT 388 11/24/2021 1043  ? MCV 86 11/24/2021 1043  ? MCH 29.4 11/24/2021 1043  ? MCH 30.1 10/17/2019 1126  ? MCHC 34.1 11/24/2021 1043  ? MCHC 33.3 10/17/2019 1126  ? RDW 12.3 11/24/2021 1043  ? LYMPHSABS 2.8 04/23/2017 1143  ? MONOABS 0.8 11/15/2016 2332  ? EOSABS 0.3 04/23/2017 1143  ? BASOSABS 0.0 04/23/2017 1143  ? ? ?ASSESSMENT AND PLAN: ?1. Essential hypertension ?Significantly uncontrolled with resistant hypertension.  He forgot to take his medications today ?Advised patient to take his medicines as soon as he returns home.  Continue to monitor blood pressure.  We will have him follow-up with  our clinical pharmacist in 2 weeks for recheck. ? ?2. Morbid obesity ([place]) ?Discussed the importance of dietary changes and trying to move more to help with weight loss.  Went over medications that are outdated to [facility]

## 2022-01-09 ENCOUNTER — Ambulatory Visit: Payer: BC Managed Care – PPO | Admitting: Internal Medicine

## 2022-01-20 ENCOUNTER — Encounter: Payer: Self-pay | Admitting: Pharmacist

## 2022-01-20 ENCOUNTER — Ambulatory Visit: Payer: BC Managed Care – PPO | Attending: Internal Medicine | Admitting: Pharmacist

## 2022-01-20 VITALS — BP 157/91 | HR 90

## 2022-01-20 DIAGNOSIS — I10 Essential (primary) hypertension: Secondary | ICD-10-CM

## 2022-01-20 NOTE — Progress Notes (Signed)
? ?  S:    ? ?No chief complaint on file. ? ? ?Richard Hale is a 36 y.o. male who presents for hypertension evaluation, education, and management. PMH is significant for HTN, preDM, cardiomyopathy with EF 45% in 2020, OSA on CPAP, morbid obesity, tob dep. Patient was referred and last seen by Primary Care Provider, Dr. Wynetta Emery, on 01/01/22. BP 181/133 at that visit.  ? ?Today, patient arrives in good spirits and presents without assistance. Denies dizziness, headache, blurred vision, swelling.  ? ?Patient reports hypertension is longstanding.  ? ?Family/Social history:  ?-Fhx: HTN, HF, DM, CAD ?-Tobacco: current 0.25 PPD smoker  ?-Alcohol: none reported ? ?Medication adherence is suboptimal. He tells me today he has been out of the labetalol for 1 month. Additionally, he has not taken his morning BP medications yet.  ? ?Current antihypertensives include: clonidine 0.3 mg weekly transdermal, amlodipine 10 mg daily (morning), HCTZ 25 mg daily (takes at night), irbesartan (takes in the morning), spironolactone 50 mg daily (this morning), labetalol 300 mg BID (ran out 1 month ago)  ? ?Reported home BP readings:  ?-Reports 137/80 this morning ?-Endorses 150s-160s/90s most often at home. No log with him today. ? ?Patient reported dietary habits:  ?-Reduced salt intake  ?-Has eliminated fast food, take-out ?-Caffeine: drinks energy drinks on Sunday nights (works night shift), [employer] ? ?Patient-reported exercise habits:  ?-Active mostly at work  ? ?O:  ?Vitals:  ? 01/20/22 1009  ?BP: (!) 157/91  ?Pulse: 90  ? ? ?Last 3 Office BP readings: ?BP Readings from Last 3 Encounters:  ?01/20/22 (!) 157/91  ?01/01/22 (!) 181/133  ?11/24/21 (!) 152/85  ? ?BMET ?   ?Component Value Date/Time  ? NA 141 11/24/2021 1043  ? K 4.5 11/24/2021 1043  ? CL 102 11/24/2021 1043  ? CO2 26 11/24/2021 1043  ? GLUCOSE 98 11/24/2021 1043  ? GLUCOSE 100 (H) 05/18/2020 1102  ? BUN 17 11/24/2021 1043  ? CREATININE 1.07 11/24/2021 1043  ? CALCIUM  9.5 11/24/2021 1043  ? GFRNONAA 96 11/21/2020 1152  ? GFRAA 111 11/21/2020 1152  ? ? ?Renal function: ?CrCl cannot be calculated (Patient's most recent lab result is older than the maximum 21 days allowed.). ? ?Clinical ASCVD: No  ?The ASCVD Risk score (Arnett DK, et al., 2019) failed to calculate for the following reasons: ?  The 2019 ASCVD risk score is only valid for ages 63 to 62 ? ?A/P: ?Hypertension diagnosed currently above goal on current medications. BP goal < 130/80 mmHg. Medication adherence appears suboptimal. Refills given for labetalol. I also encouraged him to take his medications before seeing me in 3-4 weeks.   ?-Continued current regimen- refills given for labetalol.  ?-F/u labs ordered - none ?-Counseled on lifestyle modifications for blood pressure control including reduced dietary sodium, increased exercise, adequate sleep. ?-Encouraged patient to check BP at home and bring log of readings to next visit. Counseled on proper use of home BP cuff.  ? ?Results reviewed and written information provided. Patient verbalized understanding of treatment plan. Total time in face-to-face counseling 30 minutes.  ? ?F/u clinic visit in 3-4 weeks. ? ?Benard Halsted, PharmD, BCACP, CPP ?Clinical Pharmacist ?[place] ?[phone] ? ? ?

## 2022-01-29 ENCOUNTER — Telehealth: Payer: Self-pay | Admitting: Pharmacist

## 2022-01-29 NOTE — Telephone Encounter (Signed)
Patient appearing on report for [employer] [facility] - Hypertension Control report due to last documented ambulatory blood pressure of 157/91 on 01/20/22 . Next appointment with PCP is 04/27/22. Scheduled with embedded pharmacist on 02/20/22.   ? ?Outreached patient to discuss hypertension control and medication management. Left voicemail for him to return my call at his convenience.  ? ?Catie Hedwig Morton, PharmD, BCACP ?[place] ?[phone] ? ?

## 2022-02-20 ENCOUNTER — Ambulatory Visit: Payer: BC Managed Care – PPO | Admitting: Pharmacist

## 2022-03-03 ENCOUNTER — Ambulatory Visit: Payer: BC Managed Care – PPO | Admitting: Registered"

## 2022-03-12 ENCOUNTER — Encounter: Payer: Self-pay | Admitting: Cardiology

## 2022-03-12 MED ORDER — CLONIDINE 0.3 MG/24HR TD PTWK: 0.3000 mg | MEDICATED_PATCH | TRANSDERMAL | 1 refills | Status: DC

## 2022-03-22 NOTE — Progress Notes (Deleted)
Cardiology Office Note   Date:  03/22/2022   ID:  Richard Hale, Richard Hale December 20, 1985, MRN [id number]  PCP:  [name], MD  Cardiologist:   Rollene Rotunda, MD Referring:  [name], MD    No chief complaint on file.      History of Present Illness: Richard Hale is a 36 y.o. male who presents for evaluation of chest pain.   He had an echo in June with an EF of 45%.   He was hospitalized in March 2020 with hypertensive urgency due to running out of his medication.  He was hospitalized again in June with hypertension and found to have a reduced EF.  He was last seen by Dr. Antoine Poche in clinic on 08/29/2019.  At that time he reported chest discomfort that was constant.  He reported having this chest discomfort in the hospital with a high-sensitivity troponin that was normal.  Chest pressure was felt to be atypical.  He has had problems with running out of his meds.    Since I last saw him ***   ***  he has been compliant with his medications.  He says that in between taking his medications he sometimes notices blood pressure to be 190/112.  He works 7 PM to 7 AM.  He does not have any new shortness of breath, PND or orthopnea.  Has had no palpitations, presyncope or syncope.  He denies any chest pressure, neck or arm discomfort.  He has had no weight gain or loss or edema.  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   Past Medical History:  Diagnosis Date   CHF (congestive heart failure) (H[facility])    Hypertension    Morbid obesity ([facility])    Obstructive sleep apnea    CPAP   Tobacco use 04/06/2019    Past Surgical History:  Procedure Laterality Date   NOSE SURGERY       Current Outpatient Medications  Medication Sig Dispense Refill   amLODipine (NORVASC) 10 MG tablet Take 1 tablet (10 mg total) by mouth daily. 30 tablet 5   buPROPion (WELLBUTRIN SR) 150 MG 12 hr tablet Take 1 tablet (150 mg total) by mouth 2 (two) times daily. (Patient not taking:  Reported on 01/01/2022) 60 tablet 1   cloNIDine (CATAPRES - DOSED IN MG/24 HR) 0.3 mg/24hr patch Place 1 patch (0.3 mg total) onto the skin once a week. 12 patch 1   cloNIDine (CATAPRES) 0.1 MG tablet Take 1 tablet (0.1 mg total) by mouth every 8 (eight) hours as needed. 90 tablet 11   hydrochlorothiazide (HYDRODIURIL) 25 MG tablet Take 1 tablet (25 mg total) by mouth every morning. 30 tablet 5   irbesartan (AVAPRO) 300 MG tablet Take 1 tablet (300 mg total) by mouth daily. 30 tablet 5   labetalol (NORMODYNE) 300 MG tablet Take 1 tablet (300 mg total) by mouth 2 (two) times daily. 60 tablet 5   pantoprazole (PROTONIX) 40 MG tablet Take 1 tablet (40 mg total) by mouth 2 (two) times daily. To reduce stomach acid (Patient not taking: Reported on 01/01/2022) 60 tablet 4   spironolactone (ALDACTONE) 50 MG tablet Take 1 tablet (50 mg total) by mouth daily. 30 tablet 5   varenicline (CHANTIX PAK) 0.5 MG X 11 & 1 MG X 42 tablet Take one 0.5 mg tablet by mouth once daily for 3 days, then increase to one 0.5 mg tablet twice daily for 4 days, then increase  to one 1 mg tablet twice daily. 53 tablet 0   No current facility-administered medications for this visit.    Allergies:   Lisinopril    ROS:  Please see the history of present illness.   Otherwise, review of systems are positive for ***.   All other systems are reviewed and negative.    PHYSICAL EXAM: VS:  There were no vitals taken for this visit. , BMI There is no height or weight on file to calculate BMI. GENERAL:  Well appearing NECK:  No jugular venous distention, waveform within normal limits, carotid upstroke brisk and symmetric, no bruits, no thyromegaly LUNGS:  Clear to auscultation bilaterally CHEST:  Unremarkable HEART:  PMI not displaced or sustained,S1 and S2 within normal limits, no S3, no S4, no clicks, no rubs, *** murmurs ABD:  Flat, positive bowel sounds normal in frequency in pitch, no bruits, no rebound, no guarding, no midline  pulsatile mass, no hepatomegaly, no splenomegaly EXT:  2 plus pulses throughout, no edema, no cyanosis no clubbing     ***GENERAL:  Well appearing HEENT:  Pupils equal round and reactive, fundi not visualized, oral mucosa unremarkable NECK:  No jugular venous distention, waveform within normal limits, carotid upstroke brisk and symmetric, no bruits, no thyromegaly LYMPHATICS:  No cervical, inguinal adenopathy LUNGS:  Clear to auscultation bilaterally BACK:  No CVA tenderness CHEST:  Unremarkable HEART:  PMI not displaced or sustained,S1 and S2 within normal limits, no S3, no S4, no clicks, no rubs, no murmurs ABD:  Flat, positive bowel sounds normal in frequency in pitch, no bruits, no rebound, no guarding, no midline pulsatile mass, no hepatomegaly, no splenomegaly EXT:  2 plus pulses throughout, no edema, no cyanosis no clubbing SKIN:  No rashes no nodules NEURO:  Cranial nerves II through XII grossly intact, motor grossly intact throughout PSYCH:  Cognitively intact, oriented to person place and time    EKG:  EKG is *** ordered today. The ekg ordered today demonstrates sinus rhythm, rate ***, left axis deviation, borderline QT prolongation, lateral T wave inversions consistent with repolarization changes.  This is not different from previous.   Recent Labs: 11/24/2021: ALT 29; BUN 17; Creatinine, Ser 1.07; Hemoglobin 15.4; Platelets 388; Potassium 4.5; Sodium 141    Lipid Panel    Component Value Date/Time   CHOL 121 11/24/2021 1043   TRIG 83 11/24/2021 1043   HDL 54 11/24/2021 1043   CHOLHDL 2.2 11/24/2021 1043   CHOLHDL 3.1 04/07/2019 0856   VLDL 29 04/07/2019 0856   LDLCALC 51 11/24/2021 1043      Wt Readings from Last 3 Encounters:  01/01/22 (!) 406 lb 3.2 oz (184.3 kg)  11/24/21 (!) 406 lb 3.2 oz (184.3 kg)  06/06/21 (!) 397 lb 12.8 oz (180.4 kg)      Other studies Reviewed: Additional studies/ records that were reviewed today include: *** Review of the  above records demonstrates:  Please see elsewhere in the note.     ASSESSMENT AND PLAN:  CHEST PAIN:   ***  He has had no further chest pain.  No change in therapy.  CARDIOMYOPATHY: He has had mildly reduced ejection fraction. ***  Once we get his blood pressure controlled I might repeat an echocardiogram just for baseline.  Its been a few years.  Right now he seems to have class I symptoms.  No change in therapy.   MORBID OBESITY:   ***  Simplified the dietary instructions to say he needs to stop drinking his  high sugar content drinks.   TOBACCO ABUSE :   ***   He understands the need to quit smoking.  HTN:   ***  He is going to continue with as needed clonidine which he has been using on his own.  I will give him a prescription for every 8 hours.  He responds well to this and I would like to start a Catapres patch #3.    SLEEP APNEA: He is unfortunately unable to use CPAP.  We talked about weight loss.   Current medicines are reviewed at length with the patient today.  The patient does not have concerns regarding medicines.  The following changes have been made: ***  Labs/ tests ordered today include:  ***  No orders of the defined types were placed in this encounter.     Disposition:   FU with ***   Signed, Rollene Rotunda, MD  03/22/2022 11:42 AM    [facility]

## 2022-03-23 ENCOUNTER — Ambulatory Visit: Payer: BC Managed Care – PPO | Admitting: Cardiology

## 2022-03-23 DIAGNOSIS — I10 Essential (primary) hypertension: Secondary | ICD-10-CM

## 2022-03-23 DIAGNOSIS — Z720 Tobacco use: Secondary | ICD-10-CM

## 2022-03-23 DIAGNOSIS — I420 Dilated cardiomyopathy: Secondary | ICD-10-CM

## 2022-03-27 ENCOUNTER — Encounter: Payer: Self-pay | Admitting: Cardiology

## 2022-04-27 ENCOUNTER — Ambulatory Visit: Payer: BC Managed Care – PPO | Admitting: Internal Medicine

## 2022-06-01 ENCOUNTER — Ambulatory Visit: Payer: BC Managed Care – PPO | Admitting: Internal Medicine

## 2022-06-05 ENCOUNTER — Ambulatory Visit: Payer: BC Managed Care – PPO | Admitting: Internal Medicine

## 2022-06-21 ENCOUNTER — Emergency Department (HOSPITAL_BASED_OUTPATIENT_CLINIC_OR_DEPARTMENT_OTHER): Payer: BC Managed Care – PPO | Admitting: Radiology

## 2022-06-21 ENCOUNTER — Emergency Department (HOSPITAL_BASED_OUTPATIENT_CLINIC_OR_DEPARTMENT_OTHER)
Admission: EM | Admit: 2022-06-21 | Discharge: 2022-06-21 | Disposition: A | Discharge status: Home / Self Care | Payer: BC Managed Care – PPO | Attending: Emergency Medicine | Admitting: Emergency Medicine

## 2022-06-21 ENCOUNTER — Other Ambulatory Visit: Payer: Self-pay

## 2022-06-21 ENCOUNTER — Encounter (HOSPITAL_BASED_OUTPATIENT_CLINIC_OR_DEPARTMENT_OTHER): Payer: Self-pay | Admitting: Emergency Medicine

## 2022-06-21 ENCOUNTER — Emergency Department (HOSPITAL_BASED_OUTPATIENT_CLINIC_OR_DEPARTMENT_OTHER): Payer: BC Managed Care – PPO

## 2022-06-21 DIAGNOSIS — R7989 Other specified abnormal findings of blood chemistry: Secondary | ICD-10-CM | POA: Insufficient documentation

## 2022-06-21 DIAGNOSIS — I509 Heart failure, unspecified: Secondary | ICD-10-CM | POA: Insufficient documentation

## 2022-06-21 DIAGNOSIS — E876 Hypokalemia: Secondary | ICD-10-CM | POA: Diagnosis not present

## 2022-06-21 DIAGNOSIS — I110 Hypertensive heart disease with heart failure: Secondary | ICD-10-CM | POA: Diagnosis not present

## 2022-06-21 DIAGNOSIS — R791 Abnormal coagulation profile: Secondary | ICD-10-CM | POA: Insufficient documentation

## 2022-06-21 DIAGNOSIS — R0602 Shortness of breath: Secondary | ICD-10-CM | POA: Diagnosis not present

## 2022-06-21 DIAGNOSIS — J189 Pneumonia, unspecified organism: Secondary | ICD-10-CM | POA: Diagnosis not present

## 2022-06-21 DIAGNOSIS — Z20822 Contact with and (suspected) exposure to covid-19: Secondary | ICD-10-CM | POA: Insufficient documentation

## 2022-06-21 DIAGNOSIS — Z79899 Other long term (current) drug therapy: Secondary | ICD-10-CM | POA: Diagnosis not present

## 2022-06-21 DIAGNOSIS — J811 Chronic pulmonary edema: Secondary | ICD-10-CM | POA: Diagnosis not present

## 2022-06-21 LAB — BASIC METABOLIC PANEL
Anion gap: 8 (ref 5–15)
BUN: 12 mg/dL (ref 6–20)
CO2: 28 mmol/L (ref 22–32)
Calcium: 8.7 mg/dL — ABNORMAL LOW (ref 8.9–10.3)
Chloride: 104 mmol/L (ref 98–111)
Creatinine, Ser: 1.06 mg/dL (ref 0.61–1.24)
GFR, Estimated: >60 mL/min (ref >60)
Glucose, Bld: 105 mg/dL — ABNORMAL HIGH (ref 70–99)
Potassium: 3.3 mmol/L — ABNORMAL LOW (ref 3.5–5.1)
Sodium: 140 mmol/L (ref 135–145)

## 2022-06-21 LAB — CBC
HCT: 41.2 % (ref 39.0–52.0)
Hemoglobin: 13.4 g/dL (ref 13.0–17.0)
MCH: 29.8 pg (ref 26.0–34.0)
MCHC: 32.5 g/dL (ref 30.0–36.0)
MCV: 91.6 fL (ref 80.0–100.0)
Platelets: 359 K/uL (ref 150–400)
RBC: 4.5 MIL/uL (ref 4.22–5.81)
RDW: 13.8 % (ref 11.5–15.5)
WBC: 7.9 K/uL (ref 4.0–10.5)
nRBC: 0 % (ref 0.0–0.2)

## 2022-06-21 LAB — SARS CORONAVIRUS 2 BY RT PCR: SARS Coronavirus 2 by RT PCR: NEGATIVE

## 2022-06-21 LAB — D-DIMER, QUANTITATIVE: D-Dimer, Quant: 0.72 ug{FEU}/mL — ABNORMAL HIGH (ref 0.00–0.50)

## 2022-06-21 LAB — BRAIN NATRIURETIC PEPTIDE: B Natriuretic Peptide: 193.6 pg/mL — ABNORMAL HIGH (ref 0.0–100.0)

## 2022-06-21 MED ORDER — IOHEXOL 350 MG/ML SOLN
100.0000 mL | Freq: Once | INTRAVENOUS | Status: AC | PRN
  Administered 2022-06-21: 100 mL via INTRAVENOUS

## 2022-06-21 MED ORDER — AZITHROMYCIN 250 MG PO TABS: ORAL_TABLET | ORAL | 0 refills | Status: DC

## 2022-06-21 MED ORDER — ALBUTEROL SULFATE HFA 108 (90 BASE) MCG/ACT IN AERS
2.0000 | INHALATION_SPRAY | RESPIRATORY_TRACT | Status: DC | PRN
  Administered 2022-06-21: 2 via RESPIRATORY_TRACT
  Filled 2022-06-21: qty 6.7

## 2022-06-21 MED ORDER — AMOXICILLIN 500 MG PO CAPS: 1000.0000 mg | ORAL_CAPSULE | Freq: Three times a day (TID) | ORAL | 0 refills | Status: DC

## 2022-06-21 NOTE — ED Notes (Signed)
Patient transported to CT 

## 2022-06-21 NOTE — ED Notes (Signed)
Patient with mild SOB at rest. Clear, diminished breath sounds noted. 98% on RA.

## 2022-06-21 NOTE — Discharge Instructions (Signed)
Take antibiotics as prescribed, return for worsening breathing difficulty or new concerns. Use Tylenol every 4 hours as needed for body aches or fever. Has discussed this may be also related to mild heart failure exacerbation so follow-up closely with cardiologist as you likely will need repeat echo and reassessment.  I would call Monday for the soonest appointment.

## 2022-06-21 NOTE — ED Triage Notes (Signed)
Shortness of breath for few weeks, thought it was cold but hasnt stopped. Started coughing up blood yesterday, no fevers nochills ,no sore throat

## 2022-06-21 NOTE — ED Notes (Signed)
Dc instructions reviewed with patient. Patient voiced understanding. Dc with belongings.  °

## 2022-06-21 NOTE — ED Provider Notes (Signed)
[facility] EMERGENCY DEPT Provider Note   CSN: [id number] Arrival date & time: 06/21/22  0932     History  Chief Complaint  Patient presents with   Shortness of Breath    Richard Hale is a 36 y.o. male.  Patient presents with intermittent cough and shortness of breath over the past 2 weeks.  Patient noticed small amount of blood yesterday with coughing.  Patient denies blood clot history, recent surgery, leg swelling, weight gain changes.  Patient has congestive heart failure history however denies being on Lasix and is followed outpatient by cardiology.  Patient takes blood pressure medication.  Patient denies any fevers.  Patient denies chest pain.  No recent travel.       Home Medications Prior to Admission medications   Medication Sig Start Date End Date Taking? Authorizing Provider  amoxicillin (AMOXIL) 500 MG capsule Take 2 capsules (1,000 mg total) by mouth 3 (three) times daily. 06/21/22  Yes Blane Ohara, MD  azithromycin (ZITHROMAX Z-PAK) 250 MG tablet 2 po day one, then 1 daily x 4 days 06/21/22  Yes Blane Ohara, MD  amLODipine (NORVASC) 10 MG tablet Take 1 tablet (10 mg total) by mouth daily. 12/31/21   Rollene Rotunda, MD  buPROPion (WELLBUTRIN SR) 150 MG 12 hr tablet Take 1 tablet (150 mg total) by mouth 2 (two) times daily. Patient not taking: Reported on 01/01/2022 11/21/20   [name], MD  cloNIDine (CATAPRES - DOSED IN MG/24 HR) 0.3 mg/24hr patch Place 1 patch (0.3 mg total) onto the skin once a week. 03/12/22   Rollene Rotunda, MD  cloNIDine (CATAPRES) 0.1 MG tablet Take 1 tablet (0.1 mg total) by mouth every 8 (eight) hours as needed. 06/06/21   Rollene Rotunda, MD  hydrochlorothiazide (HYDRODIURIL) 25 MG tablet Take 1 tablet (25 mg total) by mouth every morning. 12/31/21   Rollene Rotunda, MD  irbesartan (AVAPRO) 300 MG tablet Take 1 tablet (300 mg total) by mouth daily. 12/31/21   Rollene Rotunda, MD  labetalol (NORMODYNE) 300 MG tablet  Take 1 tablet (300 mg total) by mouth 2 (two) times daily. 12/31/21   Rollene Rotunda, MD  pantoprazole (PROTONIX) 40 MG tablet Take 1 tablet (40 mg total) by mouth 2 (two) times daily. To reduce stomach acid Patient not taking: Reported on 01/01/2022 08/16/19   Fulp, Hewitt Shorts, MD  spironolactone (ALDACTONE) 50 MG tablet Take 1 tablet (50 mg total) by mouth daily. 12/31/21   Rollene Rotunda, MD  varenicline (CHANTIX PAK) 0.5 MG X 11 & 1 MG X 42 tablet Take one 0.5 mg tablet by mouth once daily for 3 days, then increase to one 0.5 mg tablet twice daily for 4 days, then increase to one 1 mg tablet twice daily. 01/01/22   [name], MD      Allergies    Lisinopril    Review of Systems   Review of Systems  Constitutional:  Negative for chills and fever.  HENT:  Positive for congestion.   Eyes:  Negative for visual disturbance.  Respiratory:  Positive for cough and shortness of breath.   Cardiovascular:  Negative for chest pain.  Gastrointestinal:  Negative for abdominal pain and vomiting.  Genitourinary:  Negative for dysuria and flank pain.  Musculoskeletal:  Negative for back pain, neck pain and neck stiffness.  Skin:  Negative for rash.  Neurological:  Negative for light-headedness and headaches.    Physical Exam Updated Vital Signs BP (!) 156/107   Pulse 95  Temp 98.4 F (36.9 C) (Oral)   Resp 20   SpO2 95%  Physical Exam Vitals and nursing note reviewed.  Constitutional:      General: He is not in acute distress.    Appearance: He is well-developed.  HENT:     Head: Normocephalic and atraumatic.     Comments: Nasal congestion.    Mouth/Throat:     Mouth: Mucous membranes are moist.  Eyes:     General:        Right eye: No discharge.        Left eye: No discharge.     Conjunctiva/sclera: Conjunctivae normal.  Neck:     Trachea: No tracheal deviation.  Cardiovascular:     Rate and Rhythm: Normal rate and regular rhythm.     Heart sounds: No murmur  heard. Pulmonary:     Effort: Pulmonary effort is normal.     Breath sounds: Examination of the right-middle field reveals rales. Examination of the left-middle field reveals rales. Rales present.  Abdominal:     General: There is no distension.     Palpations: Abdomen is soft.     Tenderness: There is no abdominal tenderness. There is no guarding.  Musculoskeletal:     Cervical back: Normal range of motion and neck supple. No rigidity.     Right lower leg: No edema.     Left lower leg: No edema.  Skin:    General: Skin is warm.     Capillary Refill: Capillary refill takes less than 2 seconds.     Findings: No rash.  Neurological:     General: No focal deficit present.     Mental Status: He is alert.     Cranial Nerves: No cranial nerve deficit.  Psychiatric:        Mood and Affect: Mood normal.     ED Results / Procedures / Treatments   Labs (all labs ordered are listed, but only abnormal results are displayed) Labs Reviewed  BASIC METABOLIC PANEL - Abnormal; Notable for the following components:      Result Value   Potassium 3.3 (*)    Glucose, Bld 105 (*)    Calcium 8.7 (*)    All other components within normal limits  BRAIN NATRIURETIC PEPTIDE - Abnormal; Notable for the following components:   B Natriuretic Peptide 193.6 (*)    All other components within normal limits  D-DIMER, QUANTITATIVE - Abnormal; Notable for the following components:   D-Dimer, Quant 0.72 (*)    All other components within normal limits  SARS CORONAVIRUS 2 BY RT PCR  CBC    EKG None  Radiology CT Angio Chest PE W and/or Wo Contrast  Result Date: 06/21/2022 CLINICAL DATA:  36 year old male with shortness of breath and elevated D-dimer. EXAM: CT ANGIOGRAPHY CHEST WITH CONTRAST TECHNIQUE: Multidetector CT imaging of the chest was performed using the standard protocol during bolus administration of intravenous contrast. Multiplanar CT image reconstructions and MIPs were obtained to evaluate  the vascular anatomy. RADIATION DOSE REDUCTION: This exam was performed according to the departmental dose-optimization program which includes automated exposure control, adjustment of the mA and/or kV according to patient size and/or use of iterative reconstruction technique. CONTRAST:  OMNIPAQUE IOHEXOL 350 MG/ML SOLN COMPARISON:  12/14/2018 CT FINDINGS: Cardiovascular: This is a technically satisfactory study. No pulmonary emboli are identified. Mild cardiomegaly is noted. There is no evidence of thoracic aortic aneurysm or pericardial effusion. Mediastinum/Nodes: No enlarged mediastinal, hilar, or axillary lymph  nodes. Thyroid gland, trachea, and esophagus demonstrate no significant findings. Lungs/Pleura: A small area of ground-glass/possible airspace opacity within the inferolateral aspect of the RIGHT UPPER lobe (image 67: Series 6) is noted. There is no evidence of consolidation, mass, suspicious nodule, pleural effusion or pneumothorax. No interval change noted from the prior study. Upper Abdomen: No acute abnormality. Musculoskeletal: No acute or suspicious bony abnormalities are noted. Review of the MIP images confirms the above findings. IMPRESSION: 1. No evidence of pulmonary emboli. 2. Small area of ground-glass/possible airspace opacity within the inferolateral aspect of the RIGHT UPPER lobe, most likely representing a small area of atelectasis, infection or inflammation. 3. Mild cardiomegaly. Electronically Signed   By: Harmon Pier M.D.   On: 06/21/2022 13:28   DG Chest 2 View  Result Date: 06/21/2022 CLINICAL DATA:  Shortness of breath. Cold-like symptoms for 2 weeks. History of CHF. EXAM: CHEST - 2 VIEW COMPARISON:  Chest x-ray dated 10/17/2019 FINDINGS: Cardiomegaly. Central pulmonary vascular congestion and bilateral interstitial prominence, presumably interstitial edema, indicating CHF. Slightly more confluent opacity at the RIGHT lung base, pneumonia versus associated alveolar  pulmonary edema. No pleural effusion or pneumothorax is seen. Osseous structures about the chest are unremarkable. IMPRESSION: 1. Cardiomegaly with CHF. 2. Slightly more confluent opacity at the RIGHT lung base which could represent alveolar pulmonary edema related to the CHF or could represent a superimposed developing pneumonia. Electronically Signed   By: Bary Richard M.D.   On: 06/21/2022 10:36    Procedures Procedures    Medications Ordered in ED Medications  albuterol (VENTOLIN HFA) 108 (90 Base) MCG/ACT inhaler 2 puff (2 puffs Inhalation Given 06/21/22 1330)  iohexol (OMNIPAQUE) 350 MG/ML injection 100 mL (100 mLs Intravenous Contrast Given 06/21/22 1309)    ED Course/ Medical Decision Making/ A&P                           Medical Decision Making Amount and/or Complexity of Data Reviewed Labs: ordered. Radiology: ordered.  Risk Prescription drug management.   Patient with known heart failure controlled outpatient presents with recurrent cough and now hemoptysis with mild shortness of breath.  Patient is not on blood thinning medication, vital signs reassuring except for hypertension.  Clinical concern for infection versus heart failure exacerbation versus pulmonary embolism.  Chest x-ray reviewed no acute abnormalities.  CT angiogram ordered and reviewed independently showing no blood clots, concern for focal pneumonia versus fluid versus infection.  On reassessment patient is well-appearing, breathing comfortable.  Blood work results reviewed D-dimer mild elevated that led to patient getting CT scan.  BNP mild elevated 190s and potassium mild low 3.3.  Other electrolytes unremarkable normal Ravelo blood cell count normal hemoglobin.  Discussed plan for oral antibiotics and if no improvement or worsening symptoms to get rechecked by primary doctor and cardiology.  Patient comfortable this plan.        Final Clinical Impression(s) / ED Diagnoses Final diagnoses:  Shortness  of breath  Community acquired pneumonia, unspecified laterality    Rx / DC Orders ED Discharge Orders          Ordered    amoxicillin (AMOXIL) 500 MG capsule  3 times daily        06/21/22 1510    azithromycin (ZITHROMAX Z-PAK) 250 MG tablet        06/21/22 1510              Blane Ohara, MD 06/21/22 1513

## 2022-06-30 ENCOUNTER — Encounter: Payer: Self-pay | Admitting: Cardiology

## 2022-06-30 DIAGNOSIS — I10 Essential (primary) hypertension: Secondary | ICD-10-CM

## 2022-06-30 MED ORDER — IRBESARTAN 300 MG PO TABS: 300.0000 mg | ORAL_TABLET | Freq: Every day | ORAL | 0 refills | Status: DC

## 2022-06-30 MED ORDER — HYDROCHLOROTHIAZIDE 25 MG PO TABS: 25.0000 mg | ORAL_TABLET | Freq: Every morning | ORAL | 0 refills | Status: DC

## 2022-06-30 MED ORDER — SPIRONOLACTONE 50 MG PO TABS: 50.0000 mg | ORAL_TABLET | Freq: Every day | ORAL | 0 refills | Status: DC

## 2022-06-30 MED ORDER — LABETALOL HCL 300 MG PO TABS: 300.0000 mg | ORAL_TABLET | Freq: Two times a day (BID) | ORAL | 0 refills | Status: DC

## 2022-06-30 MED ORDER — CLONIDINE 0.3 MG/24HR TD PTWK: 0.3000 mg | MEDICATED_PATCH | TRANSDERMAL | 0 refills | Status: DC

## 2022-06-30 MED ORDER — AMLODIPINE BESYLATE 10 MG PO TABS: 10.0000 mg | ORAL_TABLET | Freq: Every day | ORAL | 0 refills | Status: DC

## 2022-06-30 NOTE — Addendum Note (Signed)
Addended by: Betha Loa F on: 06/30/2022 04:58 PM   Modules accepted: Orders

## 2022-06-30 NOTE — Addendum Note (Signed)
Addended by: Betha Loa F on: 06/30/2022 04:25 PM   Modules accepted: Orders

## 2022-07-01 ENCOUNTER — Ambulatory Visit
Admission: RE | Admit: 2022-07-01 | Discharge: 2022-07-01 | Disposition: A | Discharge status: Home / Self Care | Payer: BC Managed Care – PPO | Source: Ambulatory Visit | Attending: Internal Medicine | Admitting: Internal Medicine

## 2022-07-01 ENCOUNTER — Encounter (HOSPITAL_COMMUNITY): Payer: Self-pay

## 2022-07-01 ENCOUNTER — Emergency Department (HOSPITAL_BASED_OUTPATIENT_CLINIC_OR_DEPARTMENT_OTHER): Payer: BC Managed Care – PPO | Admitting: Radiology

## 2022-07-01 ENCOUNTER — Emergency Department (HOSPITAL_BASED_OUTPATIENT_CLINIC_OR_DEPARTMENT_OTHER): Payer: BC Managed Care – PPO

## 2022-07-01 ENCOUNTER — Other Ambulatory Visit: Payer: Self-pay

## 2022-07-01 ENCOUNTER — Inpatient Hospital Stay (HOSPITAL_BASED_OUTPATIENT_CLINIC_OR_DEPARTMENT_OTHER)
Admission: EM | Admit: 2022-07-01 | Discharge: 2022-07-04 | DRG: 291 | Disposition: A | Discharge status: Home / Self Care | Payer: BC Managed Care – PPO | Attending: Internal Medicine | Admitting: Internal Medicine

## 2022-07-01 ENCOUNTER — Encounter (HOSPITAL_BASED_OUTPATIENT_CLINIC_OR_DEPARTMENT_OTHER): Payer: Self-pay

## 2022-07-01 VITALS — BP 170/99 | HR 101 | Temp 98.2°F | Resp 20

## 2022-07-01 DIAGNOSIS — Z716 Tobacco abuse counseling: Secondary | ICD-10-CM | POA: Diagnosis not present

## 2022-07-01 DIAGNOSIS — I161 Hypertensive emergency: Secondary | ICD-10-CM | POA: Diagnosis not present

## 2022-07-01 DIAGNOSIS — I5023 Acute on chronic systolic (congestive) heart failure: Secondary | ICD-10-CM | POA: Diagnosis present

## 2022-07-01 DIAGNOSIS — Z888 Allergy status to other drugs, medicaments and biological substances status: Secondary | ICD-10-CM

## 2022-07-01 DIAGNOSIS — I5043 Acute on chronic combined systolic (congestive) and diastolic (congestive) heart failure: Principal | ICD-10-CM

## 2022-07-01 DIAGNOSIS — Z7984 Long term (current) use of oral hypoglycemic drugs: Secondary | ICD-10-CM

## 2022-07-01 DIAGNOSIS — Z79899 Other long term (current) drug therapy: Secondary | ICD-10-CM

## 2022-07-01 DIAGNOSIS — R079 Chest pain, unspecified: Secondary | ICD-10-CM | POA: Diagnosis not present

## 2022-07-01 DIAGNOSIS — I160 Hypertensive urgency: Secondary | ICD-10-CM | POA: Diagnosis present

## 2022-07-01 DIAGNOSIS — R778 Other specified abnormalities of plasma proteins: Secondary | ICD-10-CM | POA: Diagnosis not present

## 2022-07-01 DIAGNOSIS — R0602 Shortness of breath: Secondary | ICD-10-CM | POA: Diagnosis not present

## 2022-07-01 DIAGNOSIS — Z91148 Patient's other noncompliance with medication regimen for other reason: Secondary | ICD-10-CM

## 2022-07-01 DIAGNOSIS — Z720 Tobacco use: Secondary | ICD-10-CM | POA: Diagnosis not present

## 2022-07-01 DIAGNOSIS — I3139 Other pericardial effusion (noninflammatory): Secondary | ICD-10-CM | POA: Diagnosis not present

## 2022-07-01 DIAGNOSIS — I110 Hypertensive heart disease with heart failure: Principal | ICD-10-CM | POA: Diagnosis present

## 2022-07-01 DIAGNOSIS — E876 Hypokalemia: Secondary | ICD-10-CM | POA: Diagnosis present

## 2022-07-01 DIAGNOSIS — I428 Other cardiomyopathies: Secondary | ICD-10-CM | POA: Diagnosis present

## 2022-07-01 DIAGNOSIS — I248 Other forms of acute ischemic heart disease: Secondary | ICD-10-CM | POA: Diagnosis not present

## 2022-07-01 DIAGNOSIS — G4733 Obstructive sleep apnea (adult) (pediatric): Secondary | ICD-10-CM | POA: Diagnosis present

## 2022-07-01 DIAGNOSIS — R053 Chronic cough: Secondary | ICD-10-CM | POA: Diagnosis not present

## 2022-07-01 DIAGNOSIS — Z6841 Body Mass Index (BMI) 40.0 and over, adult: Secondary | ICD-10-CM | POA: Diagnosis present

## 2022-07-01 DIAGNOSIS — I10 Essential (primary) hypertension: Secondary | ICD-10-CM | POA: Diagnosis present

## 2022-07-01 DIAGNOSIS — Z8249 Family history of ischemic heart disease and other diseases of the circulatory system: Secondary | ICD-10-CM | POA: Diagnosis not present

## 2022-07-01 DIAGNOSIS — F17210 Nicotine dependence, cigarettes, uncomplicated: Secondary | ICD-10-CM | POA: Diagnosis not present

## 2022-07-01 DIAGNOSIS — R7989 Other specified abnormal findings of blood chemistry: Secondary | ICD-10-CM | POA: Diagnosis present

## 2022-07-01 DIAGNOSIS — R059 Cough, unspecified: Secondary | ICD-10-CM | POA: Diagnosis not present

## 2022-07-01 LAB — BASIC METABOLIC PANEL
Anion gap: 11 (ref 5–15)
BUN: 11 mg/dL (ref 6–20)
CO2: 25 mmol/L (ref 22–32)
Calcium: 9.2 mg/dL (ref 8.9–10.3)
Chloride: 104 mmol/L (ref 98–111)
Creatinine, Ser: 1.02 mg/dL (ref 0.61–1.24)
GFR, Estimated: >60 mL/min (ref >60)
Glucose, Bld: 173 mg/dL — ABNORMAL HIGH (ref 70–99)
Potassium: 3.3 mmol/L — ABNORMAL LOW (ref 3.5–5.1)
Sodium: 140 mmol/L (ref 135–145)

## 2022-07-01 LAB — CBC
HCT: 41.3 % (ref 39.0–52.0)
Hemoglobin: 13.8 g/dL (ref 13.0–17.0)
MCH: 30.2 pg (ref 26.0–34.0)
MCHC: 33.4 g/dL (ref 30.0–36.0)
MCV: 90.4 fL (ref 80.0–100.0)
Platelets: 373 10*3/uL (ref 150–400)
RBC: 4.57 MIL/uL (ref 4.22–5.81)
RDW: 13.7 % (ref 11.5–15.5)
WBC: 8.1 10*3/uL (ref 4.0–10.5)
nRBC: 0 % (ref 0.0–0.2)

## 2022-07-01 LAB — TROPONIN I (HIGH SENSITIVITY)
Troponin I (High Sensitivity): 132 ng/L (ref <18)
Troponin I (High Sensitivity): 115 ng/L (ref <18)

## 2022-07-01 LAB — D-DIMER, QUANTITATIVE: D-Dimer, Quant: 0.64 ug/mL-FEU — ABNORMAL HIGH (ref 0.00–0.50)

## 2022-07-01 MED ORDER — ASPIRIN 81 MG PO CHEW
324.0000 mg | CHEWABLE_TABLET | Freq: Once | ORAL | Status: AC
  Administered 2022-07-01: 324 mg via ORAL
  Filled 2022-07-01: qty 4

## 2022-07-01 MED ORDER — NITROGLYCERIN IN D5W 200-5 MCG/ML-% IV SOLN
0.0000 ug/min | INTRAVENOUS | Status: DC
  Administered 2022-07-01: 10 ug/min via INTRAVENOUS
  Administered 2022-07-02: 20 ug/min via INTRAVENOUS
  Filled 2022-07-01: qty 250

## 2022-07-01 MED ORDER — IOHEXOL 350 MG/ML SOLN
100.0000 mL | Freq: Once | INTRAVENOUS | Status: AC | PRN
  Administered 2022-07-01: 80 mL via INTRAVENOUS

## 2022-07-01 MED ORDER — FUROSEMIDE 10 MG/ML IJ SOLN
40.0000 mg | Freq: Once | INTRAMUSCULAR | Status: AC
  Administered 2022-07-01: 40 mg via INTRAVENOUS
  Filled 2022-07-01: qty 4

## 2022-07-01 NOTE — ED Provider Notes (Signed)
[facility]    CSN: [id number] Arrival date & time: 07/01/22  1142      History   Chief Complaint Chief Complaint  Patient presents with   APPT: SOB, Cough     HPI Richard Hale is a 36 y.o. male.   Patient presents with persistent symptoms of shortness of breath and nonproductive cough that has been present for multiple weeks.  Patient was seen on 06/21/2022 at emergency department.  He was treated with amoxicillin and azithromycin for suspicion of pneumonia.  He reports no improvement of symptoms.  Patient also complaining of left-sided chest pain that is intermittent, sharp pain.  Patient has history of CHF as well.  CT scan was completed at previous ED visit that showed groundglass opacities.  D-dimer was elevated which prompted CT scan.  No signs of PE on CT scan.  Also has elevated blood pressure reading but reports that he has not taken his blood pressure medication today.  Patient reports minimal nasal congestion as well.  Denies any known fevers.     Past Medical History:  Diagnosis Date   CHF (congestive heart failure) ([place])    Hypertension    Morbid obesity ([place])    Obstructive sleep apnea    CPAP   Tobacco use 04/06/2019    Patient Active Problem List   Diagnosis Date Noted   Influenza vaccine refused 11/21/2020   COVID-19 vaccination declined 11/21/2020   CHF (congestive heart failure), NYHA class II, chronic, systolic ([place]) 98/33/8250   Tobacco dependence 11/21/2020   Cardiomyopathy ([place]) 08/28/2019   Chest pain of uncertain etiology 53/97/6734   Acute coronary syndrome ([place]) 04/06/2019   Tobacco use 04/06/2019   Hypokalemia 04/06/2019   Hypertensive urgency 12/14/2018   Essential hypertension 09/05/2018   Obstructive sleep apnea 09/05/2018   Morbid obesity ([place]) 09/05/2018   Prediabetes 09/05/2018    Past Surgical History:  Procedure Laterality Date   NOSE SURGERY         Home Medications    Prior to Admission medications    Medication Sig Start Date End Date Taking? Authorizing Provider  amLODipine (NORVASC) 10 MG tablet Take 1 tablet (10 mg total) by mouth daily. 06/30/22   Minus Breeding, MD  amoxicillin (AMOXIL) 500 MG capsule Take 2 capsules (1,000 mg total) by mouth 3 (three) times daily. 06/21/22   Elnora Morrison, MD  azithromycin (ZITHROMAX Z-PAK) 250 MG tablet 2 po day one, then 1 daily x 4 days 06/21/22   Elnora Morrison, MD  buPROPion [facility] SR) 150 MG 12 hr tablet Take 1 tablet (150 mg total) by mouth 2 (two) times daily. Patient not taking: Reported on 01/01/2022 11/21/20   Ladell Pier, MD  cloNIDine (CATAPRES - DOSED IN MG/24 HR) 0.3 mg/24hr patch Place 1 patch (0.3 mg total) onto the skin once a week. 06/30/22   Minus Breeding, MD  cloNIDine (CATAPRES) 0.1 MG tablet Take 1 tablet (0.1 mg total) by mouth every 8 (eight) hours as needed. 06/06/21   Minus Breeding, MD  hydrochlorothiazide (HYDRODIURIL) 25 MG tablet Take 1 tablet (25 mg total) by mouth every morning. 06/30/22   Minus Breeding, MD  irbesartan (AVAPRO) 300 MG tablet Take 1 tablet (300 mg total) by mouth daily. 06/30/22   Minus Breeding, MD  labetalol (NORMODYNE) 300 MG tablet Take 1 tablet (300 mg total) by mouth 2 (two) times daily. 06/30/22   Minus Breeding, MD  pantoprazole (PROTONIX) 40 MG tablet Take 1 tablet (40 mg total) by mouth  2 (two) times daily. To reduce stomach acid Patient not taking: Reported on 01/01/2022 08/16/19   Fulp, Hewitt Shorts, MD  spironolactone (ALDACTONE) 50 MG tablet Take 1 tablet (50 mg total) by mouth daily. 06/30/22   Rollene Rotunda, MD  varenicline (CHANTIX PAK) 0.5 MG X 11 & 1 MG X 42 tablet Take one 0.5 mg tablet by mouth once daily for 3 days, then increase to one 0.5 mg tablet twice daily for 4 days, then increase to one 1 mg tablet twice daily. 01/01/22   [name], MD    Family History Family History  Problem Relation Age of Onset   Hypertension Mother    Heart failure Mother    Diabetes  Maternal Grandmother    CAD Father 42       Heart attack and Stroke    Social History Social History   Tobacco Use   Smoking status: Every Day    Packs/day: 0.25    Types: Cigarettes   Smokeless tobacco: Never   Tobacco comments:    5 cigs a day  Vaping Use   Vaping Use: Never used  Substance Use Topics   Alcohol use: Yes    Comment: occ   Drug use: Never     Allergies   Lisinopril   Review of Systems Review of Systems Per HPI  Physical Exam Triage Vital Signs ED Triage Vitals  Enc Vitals Group     BP 07/01/22 1201 (!) 170/99     Pulse Rate 07/01/22 1200 (!) 101     Resp 07/01/22 1200 20     Temp 07/01/22 1200 98.2 F (36.8 C)     Temp Source 07/01/22 1200 Oral     SpO2 07/01/22 1200 97 %     Weight --      Height --      Head Circumference --      Peak Flow --      Pain Score 07/01/22 1200 0     Pain Loc --      Pain Edu? --      Excl. in GC? --    No data found.  Updated Vital Signs BP (!) 170/99   Pulse (!) 101   Temp 98.2 F (36.8 C) (Oral)   Resp 20   SpO2 97%   Visual Acuity Right Eye Distance:   Left Eye Distance:   Bilateral Distance:    Right Eye Near:   Left Eye Near:    Bilateral Near:     Physical Exam Constitutional:      General: He is not in acute distress.    Appearance: Normal appearance. He is not toxic-appearing or diaphoretic.  HENT:     Head: Normocephalic and atraumatic.     Right Ear: Tympanic membrane and ear canal normal.     Left Ear: Tympanic membrane and ear canal normal.     Nose: Congestion present.     Mouth/Throat:     Mouth: Mucous membranes are moist.     Pharynx: No posterior oropharyngeal erythema.  Eyes:     Extraocular Movements: Extraocular movements intact.     Conjunctiva/sclera: Conjunctivae normal.     Pupils: Pupils are equal, round, and reactive to light.  Cardiovascular:     Rate and Rhythm: Normal rate and regular rhythm.     Pulses: Normal pulses.     Heart sounds: Normal heart  sounds.  Pulmonary:     Effort: Pulmonary effort is normal. No respiratory distress.  Breath sounds: Normal breath sounds. No stridor. No wheezing, rhonchi or rales.  Abdominal:     General: Abdomen is flat. Bowel sounds are normal.     Palpations: Abdomen is soft.  Musculoskeletal:        General: Normal range of motion.     Cervical back: Normal range of motion.  Skin:    General: Skin is warm and dry.  Neurological:     General: No focal deficit present.     Mental Status: He is alert and oriented to person, place, and time. Mental status is at baseline.  Psychiatric:        Mood and Affect: Mood normal.        Behavior: Behavior normal.      UC Treatments / Results  Labs (all labs ordered are listed, but only abnormal results are displayed) Labs Reviewed - No data to display  EKG   Radiology No results found.  Procedures Procedures (including critical care time)  Medications Ordered in UC Medications - No data to display  Initial Impression / Assessment and Plan / UC Course  I have reviewed the triage vital signs and the nursing notes.  Pertinent labs & imaging results that were available during my care of the patient were reviewed by me and considered in my medical decision making (see chart for details).     EKG completed which was fairly unremarkable compared to previous EKGs.  Limited options on evaluation and treatment given patient's extensive health history.  Prednisone would be most likely beneficial for patient given persistent symptoms but given most recent EF was 45%, so this may cause flareup of CHF.  Given persistent and worsening symptoms, I do think this warrants further evaluation and management at the hospital given limited resources here in urgent care.  Patient was agreeable to going to the hospital.  Vital signs stable at discharge.  Low suspicion that chest pain is cardiac in etiology so agree with patient self transport to the  hospital. Final Clinical Impressions(s) / UC Diagnoses   Final diagnoses:  Persistent cough  Shortness of breath     Discharge Instructions      Go to the emergency department for further evaluation and management as soon as you leave urgent care.     ED Prescriptions   None    PDMP not reviewed this encounter.   Gustavus Bryant, [place] 07/01/22 1245

## 2022-07-01 NOTE — ED Triage Notes (Signed)
Patient here POV from Home.  Endorses having SOB, Cough, CP for approximately 3 Weeks. Seen 10 Days ago for Same and diagnosed with PNA and placed on Antibiotics. Finished Antibiotics and Seeks Re-Evaluation as Symptoms have continued despite treatment.   Seen by UC today and sent for Re-Evaluation. No Fevers.  NAD Noted during Triage. A&Ox4. GCS 15. Ambulatory.

## 2022-07-01 NOTE — ED Provider Notes (Signed)
[facility] EMERGENCY DEPT Provider Note   CSN: [id number] Arrival date & time: 07/01/22  1827     History {Add pertinent medical, surgical, social history, OB history to HPI:1} Chief Complaint  Patient presents with   Shortness of Breath    Richard Hale is a 36 y.o. male.  Patient sent from urgent care with ongoing shortness of breath and cough for the past 3 weeks.  He was seen in the ED on September 10 and treated for pneumonia which she finished the antibiotics for.  States shortness of breath is about the same and he is still coughing up [place] and clear mucus.  Yesterday he had intermittent chest pain on the left side coming and going lasting for several minutes at a time.  No chest pain currently.  Yesterday was the first time he had this chest pain and reports that last for 20 to 30 minutes at a time.  He was seen at urgent care today and sent to the ED for ongoing shortness of breath and chest pain.  He does a history of hypertension, sleep apnea, obesity, CHF and states compliance with his medications.  Denies any CAD history denies any stents in his heart  The history is provided by the patient.  Shortness of Breath Associated symptoms: chest pain and cough   Associated symptoms: no abdominal pain, no fever, no headaches and no vomiting        Home Medications Prior to Admission medications   Medication Sig Start Date End Date Taking? Authorizing Provider  amLODipine (NORVASC) 10 MG tablet Take 1 tablet (10 mg total) by mouth daily. 06/30/22   Rollene Rotunda, MD  amoxicillin (AMOXIL) 500 MG capsule Take 2 capsules (1,000 mg total) by mouth 3 (three) times daily. 06/21/22   Blane Ohara, MD  azithromycin (ZITHROMAX Z-PAK) 250 MG tablet 2 po day one, then 1 daily x 4 days 06/21/22   Blane Ohara, MD  buPROPion [facility] SR) 150 MG 12 hr tablet Take 1 tablet (150 mg total) by mouth 2 (two) times daily. Patient not taking: Reported on 01/01/2022 11/21/20    [name], MD  cloNIDine (CATAPRES - DOSED IN MG/24 HR) 0.3 mg/24hr patch Place 1 patch (0.3 mg total) onto the skin once a week. 06/30/22   Rollene Rotunda, MD  cloNIDine (CATAPRES) 0.1 MG tablet Take 1 tablet (0.1 mg total) by mouth every 8 (eight) hours as needed. 06/06/21   Rollene Rotunda, MD  hydrochlorothiazide (HYDRODIURIL) 25 MG tablet Take 1 tablet (25 mg total) by mouth every morning. 06/30/22   Rollene Rotunda, MD  irbesartan (AVAPRO) 300 MG tablet Take 1 tablet (300 mg total) by mouth daily. 06/30/22   Rollene Rotunda, MD  labetalol (NORMODYNE) 300 MG tablet Take 1 tablet (300 mg total) by mouth 2 (two) times daily. 06/30/22   Rollene Rotunda, MD  pantoprazole (PROTONIX) 40 MG tablet Take 1 tablet (40 mg total) by mouth 2 (two) times daily. To reduce stomach acid Patient not taking: Reported on 01/01/2022 08/16/19   Fulp, Hewitt Shorts, MD  spironolactone (ALDACTONE) 50 MG tablet Take 1 tablet (50 mg total) by mouth daily. 06/30/22   Rollene Rotunda, MD  varenicline (CHANTIX PAK) 0.5 MG X 11 & 1 MG X 42 tablet Take one 0.5 mg tablet by mouth once daily for 3 days, then increase to one 0.5 mg tablet twice daily for 4 days, then increase to one 1 mg tablet twice daily. 01/01/22   [name], MD  Allergies    Lisinopril    Review of Systems   Review of Systems  Constitutional:  Negative for activity change, appetite change and fever.  Respiratory:  Positive for cough, chest tightness and shortness of breath.   Cardiovascular:  Positive for chest pain. Negative for leg swelling.  Gastrointestinal:  Negative for abdominal pain, nausea and vomiting.  Genitourinary:  Negative for dysuria and hematuria.  Neurological:  Negative for weakness and headaches.  Psychiatric/Behavioral:  Dysphoric mood: .ro.    all other systems are negative except as noted in the HPI and PMH.    Physical Exam Updated Vital Signs BP (!) 184/110   Pulse (!) 103   Temp 98.4 F (36.9 C)   Resp (!)  22   Ht 6\' 2"  (1.88 m)   Wt (!) 184.3 kg   SpO2 93%   BMI 52.17 kg/m  Physical Exam Vitals and nursing note reviewed.  Constitutional:      General: He is not in acute distress.    Appearance: He is well-developed.     Comments: Uncomfortable, increased work of breathing  HENT:     Head: Normocephalic and atraumatic.     Mouth/Throat:     Pharynx: No oropharyngeal exudate.  Eyes:     Conjunctiva/sclera: Conjunctivae normal.     Pupils: Pupils are equal, round, and reactive to light.  Neck:     Comments: No meningismus. Cardiovascular:     Rate and Rhythm: Normal rate and regular rhythm.     Heart sounds: Normal heart sounds. No murmur heard. Pulmonary:     Effort: Pulmonary effort is normal. No respiratory distress.     Breath sounds: Rales present.  Abdominal:     Palpations: Abdomen is soft.     Tenderness: There is no abdominal tenderness. There is no guarding or rebound.  Musculoskeletal:        General: No tenderness. Normal range of motion.     Cervical back: Normal range of motion and neck supple.     Right lower leg: No edema.     Left lower leg: No edema.  Skin:    General: Skin is warm.  Neurological:     Mental Status: He is alert and oriented to person, place, and time.     Cranial Nerves: No cranial nerve deficit.     Motor: No abnormal muscle tone.     Coordination: Coordination normal.     Comments:  5/5 strength throughout. CN 2-12 intact.Equal grip strength.   Psychiatric:        Behavior: Behavior normal.     ED Results / Procedures / Treatments   Labs (all labs ordered are listed, but only abnormal results are displayed) Labs Reviewed  BASIC METABOLIC PANEL - Abnormal; Notable for the following components:      Result Value   Potassium 3.3 (*)    Glucose, Bld 173 (*)    All other components within normal limits  TROPONIN I (HIGH SENSITIVITY) - Abnormal; Notable for the following components:   Troponin I (High Sensitivity) 132 (*)    All  other components within normal limits  CBC  D-DIMER, QUANTITATIVE  TROPONIN I (HIGH SENSITIVITY)    EKG EKG Interpretation  Date/Time:  Wednesday July 01 2022 18:37:57 EDT Ventricular Rate:  103 PR Interval:  188 QRS Duration: 118 QT Interval:  358 QTC Calculation: 468 R Axis:   -8 Text Interpretation: Sinus tachycardia Left ventricular hypertrophy with QRS widening ( R in aVL ,  Cornell product ) Nonspecific ST and T wave abnormality Abnormal ECG When compared with ECG of 01-Jul-2022 12:21, No significant change was found No significant change was found Confirmed by Ezequiel Essex [phone]) on 07/01/2022 7:58:26 PM  Radiology DG Chest 2 View  Result Date: 07/01/2022 CLINICAL DATA:  Chest pain and shortness of breath EXAM: CHEST - 2 VIEW COMPARISON:  06/21/2022 FINDINGS: Cardiac shadow is mildly enlarged but stable. Lungs are well aerated bilaterally. No focal infiltrate or effusion is seen. Mild central vascular congestion is seen although improved from the prior study. No bony abnormality is noted. IMPRESSION: Mild vascular congestion without focal infiltrate. Electronically Signed   By: Inez Catalina M.D.   On: 07/01/2022 19:07    Procedures Procedures  {Document cardiac monitor, telemetry assessment procedure when appropriate:1}  Medications Ordered in ED Medications  furosemide (LASIX) injection 40 mg (has no administration in time range)  aspirin chewable tablet 324 mg (has no administration in time range)    ED Course/ Medical Decision Making/ A&P                           Medical Decision Making Amount and/or Complexity of Data Reviewed Labs: ordered. Decision-making details documented in ED Course. Radiology: ordered and independent interpretation performed. Decision-making details documented in ED Course. ECG/medicine tests: ordered and independent interpretation performed. Decision-making details documented in ED Course.  Risk OTC drugs. Prescription drug  management.  3 weeks of ongoing shortness of breath with cough.  New intermittent chest pain since last night.  EKG is sinus rhythm without acute ST changes.  X-ray in triage shows edema.  Results reviewed and interpreted by me.  Will give IV Lasix  Echocardiogram in 2020 showed EF of 21% with diastolic dysfunction  {Document critical care time when appropriate:1} {Document review of labs and clinical decision tools ie heart score, Chads2Vasc2 etc:1}  {Document your independent review of radiology images, and any outside records:1} {Document your discussion with family members, caretakers, and with consultants:1} {Document social determinants of health affecting pt's care:1} {Document your decision making why or why not admission, treatments were needed:1} Final Clinical Impression(s) / ED Diagnoses Final diagnoses:  None    Rx / DC Orders ED Discharge Orders     None

## 2022-07-01 NOTE — Discharge Instructions (Signed)
Go to the emergency department for further evaluation and management as soon as you leave urgent care.

## 2022-07-01 NOTE — ED Notes (Signed)
Patient is being discharged from the Urgent Care and sent to the Emergency Department via self . Per Hildred Alamin, patient is in need of higher level of care due to HTN. Patient is aware and verbalizes understanding of plan of care.  Vitals:   07/01/22 1200 07/01/22 1201  BP:  (!) 170/99  Pulse: (!) 101   Resp: 20   Temp: 98.2 F (36.8 C)   SpO2: 97%

## 2022-07-01 NOTE — ED Triage Notes (Signed)
Pt presents with ongoing shortness of breath and non productive cough with no relief from prescribed medications.

## 2022-07-01 NOTE — ED Notes (Addendum)
Pt started on Nitro gtt for BP, per EDP wants DBP<100. Pt has no c/o CP

## 2022-07-02 ENCOUNTER — Encounter (HOSPITAL_COMMUNITY): Payer: Self-pay | Admitting: Family Medicine

## 2022-07-02 ENCOUNTER — Inpatient Hospital Stay (HOSPITAL_COMMUNITY): Payer: BC Managed Care – PPO

## 2022-07-02 DIAGNOSIS — I161 Hypertensive emergency: Secondary | ICD-10-CM | POA: Diagnosis not present

## 2022-07-02 DIAGNOSIS — Z8249 Family history of ischemic heart disease and other diseases of the circulatory system: Secondary | ICD-10-CM | POA: Diagnosis not present

## 2022-07-02 DIAGNOSIS — I5023 Acute on chronic systolic (congestive) heart failure: Secondary | ICD-10-CM

## 2022-07-02 DIAGNOSIS — I5043 Acute on chronic combined systolic (congestive) and diastolic (congestive) heart failure: Secondary | ICD-10-CM | POA: Diagnosis present

## 2022-07-02 DIAGNOSIS — Z91148 Patient's other noncompliance with medication regimen for other reason: Secondary | ICD-10-CM | POA: Diagnosis not present

## 2022-07-02 DIAGNOSIS — Z6841 Body Mass Index (BMI) 40.0 and over, adult: Secondary | ICD-10-CM | POA: Diagnosis not present

## 2022-07-02 DIAGNOSIS — E876 Hypokalemia: Secondary | ICD-10-CM

## 2022-07-02 DIAGNOSIS — Z79899 Other long term (current) drug therapy: Secondary | ICD-10-CM | POA: Diagnosis not present

## 2022-07-02 DIAGNOSIS — I10 Essential (primary) hypertension: Secondary | ICD-10-CM | POA: Diagnosis not present

## 2022-07-02 DIAGNOSIS — Z7984 Long term (current) use of oral hypoglycemic drugs: Secondary | ICD-10-CM | POA: Diagnosis not present

## 2022-07-02 DIAGNOSIS — R778 Other specified abnormalities of plasma proteins: Secondary | ICD-10-CM

## 2022-07-02 DIAGNOSIS — Z888 Allergy status to other drugs, medicaments and biological substances status: Secondary | ICD-10-CM | POA: Diagnosis not present

## 2022-07-02 DIAGNOSIS — I160 Hypertensive urgency: Secondary | ICD-10-CM | POA: Diagnosis present

## 2022-07-02 DIAGNOSIS — F17210 Nicotine dependence, cigarettes, uncomplicated: Secondary | ICD-10-CM | POA: Diagnosis present

## 2022-07-02 DIAGNOSIS — I428 Other cardiomyopathies: Secondary | ICD-10-CM | POA: Diagnosis present

## 2022-07-02 DIAGNOSIS — I110 Hypertensive heart disease with heart failure: Principal | ICD-10-CM

## 2022-07-02 DIAGNOSIS — I3139 Other pericardial effusion (noninflammatory): Secondary | ICD-10-CM | POA: Diagnosis present

## 2022-07-02 DIAGNOSIS — G4733 Obstructive sleep apnea (adult) (pediatric): Secondary | ICD-10-CM | POA: Diagnosis present

## 2022-07-02 DIAGNOSIS — I248 Other forms of acute ischemic heart disease: Secondary | ICD-10-CM | POA: Diagnosis present

## 2022-07-02 DIAGNOSIS — Z716 Tobacco abuse counseling: Secondary | ICD-10-CM | POA: Diagnosis not present

## 2022-07-02 DIAGNOSIS — Z720 Tobacco use: Secondary | ICD-10-CM

## 2022-07-02 LAB — COMPREHENSIVE METABOLIC PANEL
ALT: 25 U/L (ref 0–44)
AST: 32 U/L (ref 15–41)
Albumin: 3.6 g/dL (ref 3.5–5.0)
Alkaline Phosphatase: 78 U/L (ref 38–126)
Anion gap: 5 (ref 5–15)
BUN: 12 mg/dL (ref 6–20)
CO2: 28 mmol/L (ref 22–32)
Calcium: 8.7 mg/dL — ABNORMAL LOW (ref 8.9–10.3)
Chloride: 108 mmol/L (ref 98–111)
Creatinine, Ser: 1.08 mg/dL (ref 0.61–1.24)
GFR, Estimated: >60 mL/min (ref >60)
Glucose, Bld: 110 mg/dL — ABNORMAL HIGH (ref 70–99)
Potassium: 3.5 mmol/L (ref 3.5–5.1)
Sodium: 141 mmol/L (ref 135–145)
Total Bilirubin: 0.6 mg/dL (ref 0.3–1.2)
Total Protein: 7.1 g/dL (ref 6.5–8.1)

## 2022-07-02 LAB — CBC WITH DIFFERENTIAL/PLATELET
Abs Immature Granulocytes: 0.01 10*3/uL (ref 0.00–0.07)
Basophils Absolute: 0 10*3/uL (ref 0.0–0.1)
Basophils Relative: 0 %
Eosinophils Absolute: 0.7 10*3/uL — ABNORMAL HIGH (ref 0.0–0.5)
Eosinophils Relative: 10 %
HCT: 41 % (ref 39.0–52.0)
Hemoglobin: 13.3 g/dL (ref 13.0–17.0)
Immature Granulocytes: 0 %
Lymphocytes Relative: 50 %
Lymphs Abs: 3.7 10*3/uL (ref 0.7–4.0)
MCH: 30.1 pg (ref 26.0–34.0)
MCHC: 32.4 g/dL (ref 30.0–36.0)
MCV: 92.8 fL (ref 80.0–100.0)
Monocytes Absolute: 0.6 10*3/uL (ref 0.1–1.0)
Monocytes Relative: 8 %
Neutro Abs: 2.4 10*3/uL (ref 1.7–7.7)
Neutrophils Relative %: 32 %
Platelets: 346 10*3/uL (ref 150–400)
RBC: 4.42 MIL/uL (ref 4.22–5.81)
RDW: 13.5 % (ref 11.5–15.5)
WBC: 7.5 10*3/uL (ref 4.0–10.5)
nRBC: 0 % (ref 0.0–0.2)

## 2022-07-02 LAB — ECHOCARDIOGRAM COMPLETE
AR max vel: 2.22 cm2
AV Area VTI: 1.97 cm2
AV Area mean vel: 2.24 cm2
AV Mean grad: 3 mmHg
AV Peak grad: 5.6 mmHg
Ao pk vel: 1.18 m/s
Area-P 1/2: 4.41 cm2
Calc EF: 24.1 %
Height: 74 in
MV M vel: 2.61 m/s
MV Peak grad: 27.1 mmHg
S' Lateral: 6 cm
Single Plane A2C EF: 20.9 %
Single Plane A4C EF: 24.6 %
Weight: 6444.49 oz

## 2022-07-02 LAB — MAGNESIUM: Magnesium: 2.6 mg/dL — ABNORMAL HIGH (ref 1.7–2.4)

## 2022-07-02 LAB — PROCALCITONIN: Procalcitonin: <0.1 ng/mL

## 2022-07-02 LAB — PHOSPHORUS: Phosphorus: 3.4 mg/dL (ref 2.5–4.6)

## 2022-07-02 LAB — MRSA NEXT GEN BY PCR, NASAL: MRSA by PCR Next Gen: NOT DETECTED

## 2022-07-02 LAB — TSH: TSH: 0.962 u[IU]/mL (ref 0.350–4.500)

## 2022-07-02 LAB — BRAIN NATRIURETIC PEPTIDE: B Natriuretic Peptide: 166.7 pg/mL — ABNORMAL HIGH (ref 0.0–100.0)

## 2022-07-02 MED ORDER — HYDRALAZINE HCL 20 MG/ML IJ SOLN
5.0000 mg | INTRAMUSCULAR | Status: DC | PRN
  Administered 2022-07-02: 5 mg via INTRAVENOUS
  Filled 2022-07-02: qty 1

## 2022-07-02 MED ORDER — POTASSIUM CHLORIDE CRYS ER 20 MEQ PO TBCR
40.0000 meq | EXTENDED_RELEASE_TABLET | Freq: Once | ORAL | Status: AC
  Administered 2022-07-02: 40 meq via ORAL
  Filled 2022-07-02: qty 2

## 2022-07-02 MED ORDER — ACETAMINOPHEN 650 MG RE SUPP: 650.0000 mg | Freq: Four times a day (QID) | RECTAL | Status: DC | PRN

## 2022-07-02 MED ORDER — BUTALBITAL-APAP-CAFFEINE 50-325-40 MG PO TABS
1.0000 | ORAL_TABLET | Freq: Four times a day (QID) | ORAL | Status: DC | PRN
  Administered 2022-07-02: 1 via ORAL
  Filled 2022-07-02: qty 1

## 2022-07-02 MED ORDER — FUROSEMIDE 10 MG/ML IJ SOLN
80.0000 mg | Freq: Two times a day (BID) | INTRAMUSCULAR | Status: DC
  Administered 2022-07-02 – 2022-07-04 (×4): 80 mg via INTRAVENOUS
  Filled 2022-07-02 (×4): qty 8

## 2022-07-02 MED ORDER — NICOTINE 14 MG/24HR TD PT24: 14.0000 mg | MEDICATED_PATCH | Freq: Every day | TRANSDERMAL | Status: DC | PRN

## 2022-07-02 MED ORDER — AMLODIPINE BESYLATE 10 MG PO TABS
10.0000 mg | ORAL_TABLET | Freq: Every day | ORAL | Status: DC
  Administered 2022-07-02 – 2022-07-04 (×3): 10 mg via ORAL
  Filled 2022-07-02 (×3): qty 1

## 2022-07-02 MED ORDER — ACETAMINOPHEN 325 MG PO TABS
650.0000 mg | ORAL_TABLET | Freq: Four times a day (QID) | ORAL | Status: DC | PRN
  Administered 2022-07-02: 650 mg via ORAL
  Filled 2022-07-02: qty 2

## 2022-07-02 MED ORDER — ORAL CARE MOUTH RINSE: 15.0000 mL | OROMUCOSAL | Status: DC | PRN

## 2022-07-02 MED ORDER — PERFLUTREN LIPID MICROSPHERE
1.0000 mL | INTRAVENOUS | Status: AC | PRN
  Administered 2022-07-02: 2 mL via INTRAVENOUS

## 2022-07-02 MED ORDER — CHLORHEXIDINE GLUCONATE CLOTH 2 % EX PADS
6.0000 | MEDICATED_PAD | Freq: Every day | CUTANEOUS | Status: DC
  Administered 2022-07-02 – 2022-07-03 (×2): 6 via TOPICAL

## 2022-07-02 MED ORDER — SPIRONOLACTONE 25 MG PO TABS
50.0000 mg | ORAL_TABLET | Freq: Every day | ORAL | Status: DC
  Administered 2022-07-02 – 2022-07-03 (×2): 50 mg via ORAL
  Filled 2022-07-02 (×2): qty 2

## 2022-07-02 MED ORDER — FUROSEMIDE 10 MG/ML IJ SOLN
40.0000 mg | Freq: Two times a day (BID) | INTRAMUSCULAR | Status: DC
  Administered 2022-07-02: 40 mg via INTRAVENOUS
  Filled 2022-07-02 (×2): qty 4

## 2022-07-02 MED ORDER — IRBESARTAN 300 MG PO TABS
300.0000 mg | ORAL_TABLET | Freq: Every day | ORAL | Status: DC
  Administered 2022-07-02 – 2022-07-03 (×2): 300 mg via ORAL
  Filled 2022-07-02 (×2): qty 1

## 2022-07-02 NOTE — Progress Notes (Signed)
  Echocardiogram 2D Echocardiogram has been performed.  Lana Fish 07/02/2022, 8:45 AM

## 2022-07-02 NOTE — ED Notes (Signed)
[facility] called for transport to [facility] ICU room 1235

## 2022-07-02 NOTE — ED Notes (Signed)
Report given to [facility] Darrien RN

## 2022-07-02 NOTE — ED Notes (Signed)
Report given to [facility] RN @ [facility]

## 2022-07-02 NOTE — Progress Notes (Signed)
Richard Hale is a 36 y.o. male with medical history significant for chronic systolic heart failure, essential hypertension, chronic tobacco abuse, who is admitted to [facility] on 07/01/2022 by way of transfer from [facility] emergency department with suspected acute on chronic systolic heart failure after presenting from home to the latter facility complaining of shortness of breath.     He was admitted for acute CHF, and hypertensive crisis.  General exam: Appears calm and comfortable  Respiratory system: Clear to auscultation. Respiratory effort normal. Cardiovascular system: S1 & S2 heard, RRR. Pedal edema present, no murmer. Gastrointestinal system: Abdomen is nondistended, soft and nontender.  Normal bowel sounds heard. Central nervous system: Alert and oriented. No focal neurological deficits. Extremities: Symmetric 5 x 5 power. Skin: No rashes, lesions or ulcers Psychiatry:  Mood & affect appropriate.   Started the patient on IV lasix.  Cardiology consulted for elevated troponins and CHF.    Hosie Poisson, MD

## 2022-07-02 NOTE — Consult Note (Addendum)
Cardiology Consultation   Patient ID: HALSTON KINTZ MRN: [id number]; DOB: 1985/10/20  Admit date: 07/01/2022 Date of Consult: 07/02/2022  PCP:  Ladell Pier, MD   [facility] Cardiologist:  Minus Breeding, MD     Patient Profile:   Richard Hale is a 36 y.o. male with a hx of chronic systolic heart failure, HTN, chronic tobacco abuse, OSA not on CPAP, medication noncompliance who is being seen 07/02/2022 for the evaluation of CHF at the request of Dr. Karleen Hampshire.  History of Present Illness:   Mr. Nieblas is a 36 year old male with above medical history who is followed by Dr. Percival Spanish.  Per chart review, patient established care with [facility] in 12/2018.  At that time, patient was seen in the ER for evaluation of chest pain. Chest pain symptoms were pleuritic and related to a recent URI.  He was later admitted to the hospital in 03/2019 for hypertension.  Underwent an echocardiogram on 04/07/2019 that showed EF 45%, moderately increased LVH, no regional wall motion abnormalities.  Patient was later seen by Dr. Percival Spanish in clinic on 08/29/2019.  At that time, patient reported chest discomfort that was constant.  He reported having this chest discomfort while he was in the hospital and a high sensitive troponin at that time was normal.  Chest pressure was felt to be atypical.  Dr. Percival Spanish suspected that his symptoms were caused by uncontrolled HTN. Since then, patient has continued to follow with our office for HTN .  Patient was last seen by cardiology on 11/24/2021. At that time,  patient was feeling well. BP was elevated to 150/80. He was instructed to take amlodipine 10 mg daily, HCTZ 25 mg daily, irbesartan 300 mg daily, spironolactone 50 mg daily, and a clonidine patch 0.3 mg/24 hours.   Patient presented to the ER on 9/20 complaining of shortness of breath, cough, chest pain that had been present for approximately 3 weeks.  Patient was seen 10  days ago for the same symptoms, diagnosed with pneumonia and started on antibiotics.  He finished the antibiotics, and he is with seeking reevaluation as the symptoms have continued despite treatment. Labs in the ED showed Na 140, K 3.3, creatinine 1.02, WBC 8.1, hemoglobin 13.8, platelets 373. BNP elevated to 166.7. hsTn 132>>115.   CXR showed mild vascular congestion without focal infiltrate. EKG showed sinus rhythm, HR 98 BPM.   Currently stable   Past Medical History:  Diagnosis Date   CHF (congestive heart failure) ([place])    Hypertension    Morbid obesity ([place])    Obstructive sleep apnea    CPAP   Tobacco use 04/06/2019    Past Surgical History:  Procedure Laterality Date   NOSE SURGERY       Home Medications:  Prior to Admission medications   Medication Sig Start Date End Date Taking? Authorizing Provider  amoxicillin (AMOXIL) 500 MG capsule Take 2 capsules (1,000 mg total) by mouth 3 (three) times daily. 06/21/22  Yes Elnora Morrison, MD  azithromycin (ZITHROMAX Z-PAK) 250 MG tablet 2 po day one, then 1 daily x 4 days 06/21/22  Yes Elnora Morrison, MD  hydrochlorothiazide (HYDRODIURIL) 25 MG tablet Take 1 tablet (25 mg total) by mouth every morning. 06/30/22  Yes Minus Breeding, MD  labetalol (NORMODYNE) 300 MG tablet Take 1 tablet (300 mg total) by mouth 2 (two) times daily. 06/30/22  Yes Minus Breeding, MD  spironolactone (ALDACTONE) 50 MG tablet Take 1 tablet (50 mg total)  by mouth daily. 06/30/22  Yes Rollene Rotunda, MD  amLODipine (NORVASC) 10 MG tablet Take 1 tablet (10 mg total) by mouth daily. 06/30/22   Rollene Rotunda, MD  buPROPion (WELLBUTRIN SR) 150 MG 12 hr tablet Take 1 tablet (150 mg total) by mouth 2 (two) times daily. Patient not taking: Reported on 01/01/2022 11/21/20   [name], MD  cloNIDine (CATAPRES - DOSED IN MG/24 HR) 0.3 mg/24hr patch Place 1 patch (0.3 mg total) onto the skin once a week. 06/30/22   Rollene Rotunda, MD  cloNIDine (CATAPRES) 0.1 MG  tablet Take 1 tablet (0.1 mg total) by mouth every 8 (eight) hours as needed. 06/06/21   Rollene Rotunda, MD  irbesartan (AVAPRO) 300 MG tablet Take 1 tablet (300 mg total) by mouth daily. 06/30/22   Rollene Rotunda, MD  pantoprazole (PROTONIX) 40 MG tablet Take 1 tablet (40 mg total) by mouth 2 (two) times daily. To reduce stomach acid Patient not taking: Reported on 01/01/2022 08/16/19   Fulp, Hewitt Shorts, MD  varenicline (CHANTIX PAK) 0.5 MG X 11 & 1 MG X 42 tablet Take one 0.5 mg tablet by mouth once daily for 3 days, then increase to one 0.5 mg tablet twice daily for 4 days, then increase to one 1 mg tablet twice daily. 01/01/22   [name], MD    Inpatient Medications: Scheduled Meds:  amLODipine  10 mg Oral Daily   Chlorhexidine Gluconate Cloth  6 each Topical Daily   furosemide  40 mg Intravenous BID   irbesartan  300 mg Oral Daily   Continuous Infusions:  nitroGLYCERIN 20 mcg/min (07/02/22 0800)   PRN Meds: acetaminophen **OR** acetaminophen, butalbital-acetaminophen-caffeine, nicotine, mouth rinse, perflutren lipid microspheres (DEFINITY) IV suspension  Allergies:    Allergies  Allergen Reactions   Lisinopril Cough    Social History:   Social History   Socioeconomic History   Marital status: Married    Spouse name: Not on file   Number of children: Not on file   Years of education: Not on file   Highest education level: Not on file  Occupational History   Not on file  Tobacco Use   Smoking status: Every Day    Packs/day: 0.25    Types: Cigarettes   Smokeless tobacco: Never   Tobacco comments:    5 cigs a day  Vaping Use   Vaping Use: Never used  Substance and Sexual Activity   Alcohol use: Yes    Comment: occ   Drug use: Never   Sexual activity: Yes  Other Topics Concern   Not on file  Social History Narrative   Lives with girlfriend and her kids.  He has three children.  Works at a [employer] broadcast [employer].     Social Determinants of Health   Financial Resource  Strain: Not on file  Food Insecurity: No Food Insecurity (07/02/2022)   Hunger Vital Sign    Worried About Running Out of Food in the Last Year: Never true    Ran Out of Food in the Last Year: Never true  Transportation Needs: No Transportation Needs (07/02/2022)   PRAPARE - Administrator, Civil Service (Medical): No    Lack of Transportation (Non-Medical): No  Physical Activity: Not on file  Stress: Not on file  Social Connections: Not on file  Intimate Partner Violence: Not At Risk (07/02/2022)   Humiliation, Afraid, Rape, and Kick questionnaire    Fear of Current or Ex-Partner: No    Emotionally Abused:  No    Physically Abused: No    Sexually Abused: No    Family History:    Family History  Problem Relation Age of Onset   Hypertension Mother    Heart failure Mother    Diabetes Maternal Grandmother    CAD Father 6       Heart attack and Stroke     ROS:  Please see the history of present illness.   All other ROS reviewed and negative.     Physical Exam/Data:   Vitals:   07/02/22 0400 07/02/22 0600 07/02/22 0744 07/02/22 0800  BP:  (!) 153/109 (!) 181/121 (!) 154/116  Pulse:  83  92  Resp:  20  17  Temp: 98 F (36.7 C)   97.8 F (36.6 C)  TempSrc: Oral   Oral  SpO2:  96%  98%  Weight:      Height:        Intake/Output Summary (Last 24 hours) at 07/02/2022 1021 Last data filed at 07/02/2022 0800 Gross per 24 hour  Intake 265.04 ml  Output 2100 ml  Net -1834.96 ml      07/02/2022    1:41 AM 07/01/2022    6:42 PM 01/01/2022    3:53 PM  Last 3 Weights  Weight (lbs) 402 lb 12.5 oz 406 lb 4.9 oz 406 lb 3.2 oz  Weight (kg) 182.7 kg 184.3 kg 184.251 kg     Body mass index is 51.71 kg/m.  General:  morbidly obese HEENT: normal Neck: + JVD Vascular: No carotid bruits; Distal pulses 2+ bilaterally Cardiac:  normal S1, S2; RRR; no murmur  Lungs:  nl wob, decreased BS in the bases Abd: soft, nontender, no hepatomegaly  Ext: no  edema Musculoskeletal:  No deformities, BUE and BLE strength normal and equal Skin: warm and dry  Neuro:  CNs 2-12 intact, no focal abnormalities noted Psych:  Normal affect   EKG:  The EKG was personally reviewed and demonstrates:  Sinus rhythm, HR 98 BPM   Telemetry:  Telemetry was personally reviewed and demonstrates:  sinus rhythm  Relevant CV Studies: TTE 1. The left ventricle has a visually estimated ejection fraction of 45%.  The cavity size was mildly dilated. There is moderately increased left  ventricular wall thickness. Left ventricular diastolic Doppler parameters  are consistent with  pseudonormalization. Left ventrical global hypokinesis without regional  wall motion abnormalities.   2. The right ventricle has normal systolic function. The cavity was  normal. There is no increase in right ventricular wall thickness.   3. No evidence of mitral valve stenosis. No mitral regurgitation.   4. The aortic valve is tricuspid. No stenosis of the aortic valve.   5. The aortic root is normal in size and structure. No complete TR  doppler jet so unable to estimate PA systolic pressure.   Laboratory Data:  High Sensitivity Troponin:   Recent Labs  Lab 07/01/22 1845 07/01/22 2045  TROPONINIHS 132* 115*     Chemistry Recent Labs  Lab 07/01/22 1845 07/02/22 0349  NA 140 141  K 3.3* 3.5  CL 104 108  CO2 25 28  GLUCOSE 173* 110*  BUN 11 12  CREATININE 1.02 1.08  CALCIUM 9.2 8.7*  MG  --  2.6*  GFRNONAA >60 >60  ANIONGAP 11 5    Recent Labs  Lab 07/02/22 0349  PROT 7.1  ALBUMIN 3.6  AST 32  ALT 25  ALKPHOS 78  BILITOT 0.6   Lipids No results for  input(s): "CHOL", "TRIG", "HDL", "LABVLDL", "LDLCALC", "CHOLHDL" in the last 168 hours.  Hematology Recent Labs  Lab 07/01/22 1845 07/02/22 0349  WBC 8.1 7.5  RBC 4.57 4.42  HGB 13.8 13.3  HCT 41.3 41.0  MCV 90.4 92.8  MCH 30.2 30.1  MCHC 33.4 32.4  RDW 13.7 13.5  PLT 373 346   Thyroid  Recent Labs   Lab 07/02/22 0719  TSH 0.962    BNP Recent Labs  Lab 07/02/22 0719  BNP 166.7*    DDimer  Recent Labs  Lab 07/01/22 2023  DDIMER 0.64*     Radiology/Studies:  CT Angio Chest PE W and/or Wo Contrast  Result Date: 07/01/2022 CLINICAL DATA:  Shortness of breath and nonproductive cough. EXAM: CT ANGIOGRAPHY CHEST WITH CONTRAST TECHNIQUE: Multidetector CT imaging of the chest was performed using the standard protocol during bolus administration of intravenous contrast. Multiplanar CT image reconstructions and MIPs were obtained to evaluate the vascular anatomy. RADIATION DOSE REDUCTION: This exam was performed according to the departmental dose-optimization program which includes automated exposure control, adjustment of the mA and/or kV according to patient size and/or use of iterative reconstruction technique. CONTRAST:  67mL OMNIPAQUE IOHEXOL 350 MG/ML SOLN COMPARISON:  June 21, 2022 FINDINGS: Cardiovascular: The subsegmental pulmonary arteries are limited in evaluation secondary to suboptimal opacification with intravenous contrast. No evidence of pulmonary embolism. There is mild to moderate severity cardiomegaly. No pericardial effusion. Mediastinum/Nodes: There is mild bilateral lymphadenopathy. Thyroid gland, trachea, and esophagus demonstrate no significant findings. Lungs/Pleura: A small, stable area of atelectasis and/or infiltrate is seen within the inferolateral aspect of the right upper lobe. No pleural effusion or pneumothorax. Upper Abdomen: No acute abnormality. Musculoskeletal: No chest wall abnormality. No acute or significant osseous findings. Review of the MIP images confirms the above findings. IMPRESSION: 1. No evidence of pulmonary embolism. 2. Mild, stable inferior right upper lobe atelectasis and/or infiltrate. 3. Mild to moderate severity cardiomegaly. Electronically Signed   By: Aram Candela M.D.   On: 07/01/2022 22:05   DG Chest 2 View  Result Date:  07/01/2022 CLINICAL DATA:  Chest pain and shortness of breath EXAM: CHEST - 2 VIEW COMPARISON:  06/21/2022 FINDINGS: Cardiac shadow is mildly enlarged but stable. Lungs are well aerated bilaterally. No focal infiltrate or effusion is seen. Mild central vascular congestion is seen although improved from the prior study. No bony abnormality is noted. IMPRESSION: Mild vascular congestion without focal infiltrate. Electronically Signed   By: Alcide Clever M.D.   On: 07/01/2022 19:07     Assessment and Plan:   Acute on chronic combined systolic and diastolic heart failure/HTN -->echo today shows dilated LV with severe LV dysfunction, RV function is reduced as well, the windows are challenging. Much worse than prior. Likely non ischemic related to poorly controlled HTN - continue nitro gtt - lasix 80 mg IV BID - will switch irbesartan 300 mg daily  to entresto  - will start SGLT2 - EF is much worse, will hold off on BB until closer to dispo, would change to coreg - stop home clonidine - Continue irbesartan 300 mg daily  - Resume home spironolactone 50 mg daily    Risk Assessment/Risk Scores:     Carolan Clines E  For questions or updates, please contact [facility] Please consult [email or web address] for contact info under

## 2022-07-02 NOTE — Progress Notes (Signed)
patient placement notified of patients arrival to unit.

## 2022-07-02 NOTE — H&P (Addendum)
History and Physical    PLEASE NOTE THAT DRAGON DICTATION SOFTWARE WAS USED IN THE CONSTRUCTION OF THIS NOTE.   MAMOUDOU DIGIROLAMO KDX:[id number] DOB: 07-Jan-1986 DOA: 07/01/2022  PCP: [name], MD  Patient coming from: home   I have personally briefly reviewed patient's old medical records in [facility]  Chief Complaint: sob  HPI: Richard Hale is a 36 y.o. male with medical history significant for chronic systolic heart failure, essential hypertension, chronic tobacco abuse, who is admitted to [facility] on 07/01/2022 by way of transfer from [facility] emergency department with suspected acute on chronic systolic heart failure after presenting from home to the latter facility complaining of shortness of breath.   The patient reports 1 to 2 weeks of progressive shortness of breath associated with new onset nonproductive cough as well as worsening of edema in the bilateral lower extremities as well has an element of orthopnea.  Denies any associated subjective fever, chills, rigors, or generalized myalgias.  Not associate with any chest pain, palpitations, diaphoresis, dizziness, presyncope, or syncope.  No recent trauma or travel.  No hemoptysis.    Since onset of shortness of breath, the patient has been on antibiotics as an outpatient for potential atypical pneumonia vs acute bronchitis, including course of azithromycin as well as amoxicillin, upon which the patient is noted no seeing improvement in his respiratory status.  He has a medical history notable for chronic systolic heart failure, with most recent tachycardic room in June 2020, which demonstrated LVEF 45%, mildly dilated left ventricular cavity size, and normal right ventricular.  Insufficient diuretic regimen consists of HCTZ and spironolactone in the absence of any loop diuretics.    [facility] ED Course:  Vital signs in the ED were notable for the following: Afebrile; initial heart rate 104,  subsequently into the 90s following initiation of IV diuresis efforts, as further detailed below; blood pressure initially 187/115, subsequent improving to 157/93; respiratory rate 18-27, oxygen saturation 95 to 99% on room air.  Labs were notable for the following: BMP notable for the following: Potassium 3.3, bicarbonate 22, creatinine 1.02.  Initial high-sensitivity troponin I 132, with repeat value trending down to 115.  D-dimer 0.64.  CBC notable for [place] cell count 8100, hemoglobin 13.8.  EKG, comparison to most recent prior from 06/23/2022 shows sinus rhythm with incomplete left bundle branch block, heart rate 98, nonspecific T wave inversion in leads V5/V6, which appears unchanged relative to most recent prior EKG, showing no evidence of ST changes, including no evidence of ST elevation.  2 view chest x-ray shows interval increase in pulmonary vascular congestion, without evidence of infiltrate, pleural effusion, or pneumothorax.  CTA chest showed no evidence of acute pulmonary embolism.  While in the ED, the following were administered: Full dose aspirin x1, Lasix 40 mg IV x1, nitroglycerin drip initiated.  Subsequently, the patient was admitted to [facility] for further evaluation management of suspected acute on chronic systolic heart failure with presenting labs notable for mildly elevated troponin as well as hypokalemia.   Review of Systems: As per HPI otherwise 10 point review of systems negative.   Past Medical History:  Diagnosis Date   CHF (congestive heart failure) (HCC)    Hypertension    Morbid obesity (HCC)    Obstructive sleep apnea    CPAP   Tobacco use 04/06/2019    Past Surgical History:  Procedure Laterality Date   NOSE SURGERY      Social History:  reports  that he has been smoking cigarettes. He has been smoking an average of .25 packs per day. He has never used smokeless tobacco. He reports current alcohol use. He reports that he does not use  drugs.   Allergies  Allergen Reactions   Lisinopril Cough    Family History  Problem Relation Age of Onset   Hypertension Mother    Heart failure Mother    Diabetes Maternal Grandmother    CAD Father 57       Heart attack and Stroke    Family history reviewed and not pertinent    Prior to Admission medications   Medication Sig Start Date End Date Taking? Authorizing Provider  amoxicillin (AMOXIL) 500 MG capsule Take 2 capsules (1,000 mg total) by mouth 3 (three) times daily. 06/21/22  Yes Elnora Morrison, MD  azithromycin (ZITHROMAX Z-PAK) 250 MG tablet 2 po day one, then 1 daily x 4 days 06/21/22  Yes Elnora Morrison, MD  hydrochlorothiazide (HYDRODIURIL) 25 MG tablet Take 1 tablet (25 mg total) by mouth every morning. 06/30/22  Yes Minus Breeding, MD  labetalol (NORMODYNE) 300 MG tablet Take 1 tablet (300 mg total) by mouth 2 (two) times daily. 06/30/22  Yes Minus Breeding, MD  spironolactone (ALDACTONE) 50 MG tablet Take 1 tablet (50 mg total) by mouth daily. 06/30/22  Yes Minus Breeding, MD  amLODipine (NORVASC) 10 MG tablet Take 1 tablet (10 mg total) by mouth daily. 06/30/22   Minus Breeding, MD  buPROPion (WELLBUTRIN SR) 150 MG 12 hr tablet Take 1 tablet (150 mg total) by mouth 2 (two) times daily. Patient not taking: Reported on 01/01/2022 11/21/20   Ladell Pier, MD  cloNIDine (CATAPRES - DOSED IN MG/24 HR) 0.3 mg/24hr patch Place 1 patch (0.3 mg total) onto the skin once a week. 06/30/22   Minus Breeding, MD  cloNIDine (CATAPRES) 0.1 MG tablet Take 1 tablet (0.1 mg total) by mouth every 8 (eight) hours as needed. 06/06/21   Minus Breeding, MD  irbesartan (AVAPRO) 300 MG tablet Take 1 tablet (300 mg total) by mouth daily. 06/30/22   Minus Breeding, MD  pantoprazole (PROTONIX) 40 MG tablet Take 1 tablet (40 mg total) by mouth 2 (two) times daily. To reduce stomach acid Patient not taking: Reported on 01/01/2022 08/16/19   Fulp, Ander Gaster, MD  varenicline (CHANTIX PAK) 0.5 MG X  11 & 1 MG X 42 tablet Take one 0.5 mg tablet by mouth once daily for 3 days, then increase to one 0.5 mg tablet twice daily for 4 days, then increase to one 1 mg tablet twice daily. 01/01/22   Ladell Pier, MD     Objective    Physical Exam: Vitals:   07/02/22 0050 07/02/22 0121 07/02/22 0141 07/02/22 0145  BP: (!) 159/105 (!) 168/107  (!) 157/93  Pulse: 64 (!) 109  89  Resp: (!) 24 19  18   Temp:  98.3 F (36.8 C)    TempSrc:  Oral    SpO2: 94% 99%  98%  Weight:   (!) 182.7 kg   Height:   6\' 2"  (1.88 m)     General: appears to be stated age; alert, oriented; mildly increased work of breathing noted Skin: warm, dry, no rash Head:  AT/[place] Mouth:  Oral mucosa membranes appear moist, normal dentition Neck: supple; trachea midline Heart:  RRR; did not appreciate any M/R/G Lungs: CTAB, did not appreciate any wheezes, rales, or rhonchi Abdomen: + BS; soft, ND, NT Vascular: 2+ pedal pulses b/l;  2+ radial pulses b/l Extremities: Trace edema in bilateral lower extremities, no muscle wasting Neuro: strength and sensation intact in upper and lower extremities b/l    Labs on Admission: I have personally reviewed following labs and imaging studies  CBC: Recent Labs  Lab 07/01/22 1845  WBC 8.1  HGB 13.8  HCT 41.3  MCV 90.4  PLT 373   Basic Metabolic Panel: Recent Labs  Lab 07/01/22 1845  NA 140  K 3.3*  CL 104  CO2 25  GLUCOSE 173*  BUN 11  CREATININE 1.02  CALCIUM 9.2   GFR: Estimated Creatinine Clearance: 175 mL/min (by C-G formula based on SCr of 1.02 mg/dL). Liver Function Tests: No results for input(s): "AST", "ALT", "ALKPHOS", "BILITOT", "PROT", "ALBUMIN" in the last 168 hours. No results for input(s): "LIPASE", "AMYLASE" in the last 168 hours. No results for input(s): "AMMONIA" in the last 168 hours. Coagulation Profile: No results for input(s): "INR", "PROTIME" in the last 168 hours. Cardiac Enzymes: No results for input(s): "CKTOTAL", "CKMB",  "CKMBINDEX", "TROPONINI" in the last 168 hours. BNP (last 3 results) No results for input(s): "PROBNP" in the last 8760 hours. HbA1C: No results for input(s): "HGBA1C" in the last 72 hours. CBG: No results for input(s): "GLUCAP" in the last 168 hours. Lipid Profile: No results for input(s): "CHOL", "HDL", "LDLCALC", "TRIG", "CHOLHDL", "LDLDIRECT" in the last 72 hours. Thyroid Function Tests: No results for input(s): "TSH", "T4TOTAL", "FREET4", "T3FREE", "THYROIDAB" in the last 72 hours. Anemia Panel: No results for input(s): "VITAMINB12", "FOLATE", "FERRITIN", "TIBC", "IRON", "RETICCTPCT" in the last 72 hours. Urine analysis:    Component Value Date/Time   COLORURINE YELLOW 12/14/2018 1823   APPEARANCEUR CLEAR 12/14/2018 1823   LABSPEC 1.019 12/14/2018 1823   PHURINE 6.0 12/14/2018 1823   GLUCOSEU NEGATIVE 12/14/2018 1823   HGBUR NEGATIVE 12/14/2018 1823   BILIRUBINUR negative 04/06/2019 1717   KETONESUR negative 04/06/2019 1717   KETONESUR NEGATIVE 12/14/2018 1823   PROTEINUR 30 (A) 12/14/2018 1823   UROBILINOGEN 0.2 04/06/2019 1717   NITRITE Negative 04/06/2019 1717   NITRITE NEGATIVE 12/14/2018 1823   LEUKOCYTESUR Negative 04/06/2019 1717   LEUKOCYTESUR NEGATIVE 12/14/2018 1823    Radiological Exams on Admission: CT Angio Chest PE W and/or Wo Contrast  Result Date: 07/01/2022 CLINICAL DATA:  Shortness of breath and nonproductive cough. EXAM: CT ANGIOGRAPHY CHEST WITH CONTRAST TECHNIQUE: Multidetector CT imaging of the chest was performed using the standard protocol during bolus administration of intravenous contrast. Multiplanar CT image reconstructions and MIPs were obtained to evaluate the vascular anatomy. RADIATION DOSE REDUCTION: This exam was performed according to the departmental dose-optimization program which includes automated exposure control, adjustment of the mA and/or kV according to patient size and/or use of iterative reconstruction technique. CONTRAST:  80mL  OMNIPAQUE IOHEXOL 350 MG/ML SOLN COMPARISON:  June 21, 2022 FINDINGS: Cardiovascular: The subsegmental pulmonary arteries are limited in evaluation secondary to suboptimal opacification with intravenous contrast. No evidence of pulmonary embolism. There is mild to moderate severity cardiomegaly. No pericardial effusion. Mediastinum/Nodes: There is mild bilateral lymphadenopathy. Thyroid gland, trachea, and esophagus demonstrate no significant findings. Lungs/Pleura: A small, stable area of atelectasis and/or infiltrate is seen within the inferolateral aspect of the right upper lobe. No pleural effusion or pneumothorax. Upper Abdomen: No acute abnormality. Musculoskeletal: No chest wall abnormality. No acute or significant osseous findings. Review of the MIP images confirms the above findings. IMPRESSION: 1. No evidence of pulmonary embolism. 2. Mild, stable inferior right upper lobe atelectasis and/or infiltrate. 3. Mild to  moderate severity cardiomegaly. Electronically Signed   By: Aram Candela M.D.   On: 07/01/2022 22:05   DG Chest 2 View  Result Date: 07/01/2022 CLINICAL DATA:  Chest pain and shortness of breath EXAM: CHEST - 2 VIEW COMPARISON:  06/21/2022 FINDINGS: Cardiac shadow is mildly enlarged but stable. Lungs are well aerated bilaterally. No focal infiltrate or effusion is seen. Mild central vascular congestion is seen although improved from the prior study. No bony abnormality is noted. IMPRESSION: Mild vascular congestion without focal infiltrate. Electronically Signed   By: Alcide Clever M.D.   On: 07/01/2022 19:07     EKG: Independently reviewed, with result as described above.    Assessment/Plan    Principal Problem:   Acute on chronic systolic CHF (congestive heart failure) (HCC) Active Problems:   Essential hypertension   Obesity, Class III, BMI 40-49.9 (morbid obesity) (HCC)   Tobacco abuse   Hypokalemia   Elevated troponin      #) Acute on chronic systolic heart  failure: dx of acute decompensation on the basis of presenting 1 to 2 weeks of progressive shortness of breath associate with new onset nonproductive cough, orthopnea, worsening of edema in the bilateral lower extremities, with chest x-ray showing evidence that interval increase in pulmonary vascular congestion. This is in the context of a known history of chronic systolic heart failure, with most recent echocardiogram performed in June 2020, with LVEF 45%. Etiology leading to presenting acutely decompensated heart failure is not entirely clear at this time, although risk factors for acute decompensation of heart failure include perpetuation of suboptimal afterload status in the setting of body habitus. I suspect that mildly elevated initial troponin is a consequence of underlying acutely decompensated heart failure as opposed to representing ACS causing presenting acute heart failure exacerbation, particularly in the absence of any recent CP, and with presenting EKG showing no evidence of acute ischemic changes. However, will continue to evaluate with close monitoring on tele and proceed with echocardiogram in the morning. Patient conveys good compliance with home diuretic therapy.  Would likely benefit from further afterload reduction.     Plan: monitor strict I's & O's and daily weights.  Lasix 40 mg IV twice daily .  Potassium chloride 40 mill equivalents p.o. x1 dose now.  Monitor on telemetry, including trend in HR in response to diuresis, as above. Monitor continuous pulse oximetry. Repeat CMP in the morning, including for monitoring trend of potassium, bicarbonate, and renal function in response to interval diuresis efforts. Add-on serum magnesium level, and repeat this level in the AM. Close monitoring of ensuing blood pressure response to diuresis efforts, including to help guide need for improvement in afterload reduction in order to optimize cardiac output.  We will begin resumption of home  antihypertensive medications, starting with resumption of irbesartan and amlodipine.  Echocardiogram in the morning.  Add on BNP.  Check procalcitonin level.  TSH.  Continue nitroglycerin drip for BP control.          #) Morbid obesity: Diagnosis on the basis of presenting BMI of greater than 51.  Notable in the setting of acute on chronic systolic heart failure.   Plan: Check TSH.  Monitor strict I's and O's and daily weights.         #) Hypokalemia: Presenting serum potassium level 3.3, which is likely to trend down further in the setting of interval IV diuresis efforts.  Plan: Potassium chloride 40 mEq p.o. x1 dose now.  Repeat CMP in the morning.  May require scheduled potassium supplementation while undergoing active IV diuresis.  Add on serum magnesium level.  Monitor on telemetry.            #) Essential hypertension: Documented history of such, on several antihypertensive medications, as above, with likely room for further optimization of afterload reduction intervention rather than in the context of presenting acute on chronic systolic heart failure.  will begin resumption of home antihypertensive medications, as further outlined above.  Plan: Amlodipine for now.  IV Lasix in the setting of acute on chronic systolic heart failure, as above.  Nitroglycerin drip for blood pressure control, as above.  Close monitoring of ensuing blood pressure via routine vital signs.  Monitor strict I's and O's and daily weights.          #) Chronic tobacco abuse: Patient conveys that they are a current smoker, having smoked 0.25 ppd for over 10 years.   Plan: Counseled the patient for less than 2 minutes on the importance of complete smoking discontinuation.  Order placed for prn nicotine patch for use during this hospitalization.         DVT prophylaxis: SCD's   Code Status: Full code Family Communication: none Disposition Plan: Per Rounding Team Consults called:  none;  Admission status: inpatient   PLEASE NOTE THAT DRAGON DICTATION SOFTWARE WAS USED IN THE CONSTRUCTION OF THIS NOTE.   Chaney Born Serenity Batley DO [facility] From 7PM - 7AM   07/02/2022, 1:47 AM

## 2022-07-03 ENCOUNTER — Other Ambulatory Visit: Payer: Self-pay | Admitting: Physician Assistant

## 2022-07-03 DIAGNOSIS — I10 Essential (primary) hypertension: Secondary | ICD-10-CM | POA: Diagnosis not present

## 2022-07-03 DIAGNOSIS — I161 Hypertensive emergency: Secondary | ICD-10-CM

## 2022-07-03 DIAGNOSIS — I420 Dilated cardiomyopathy: Secondary | ICD-10-CM

## 2022-07-03 DIAGNOSIS — R778 Other specified abnormalities of plasma proteins: Secondary | ICD-10-CM | POA: Diagnosis not present

## 2022-07-03 DIAGNOSIS — I5023 Acute on chronic systolic (congestive) heart failure: Secondary | ICD-10-CM | POA: Diagnosis not present

## 2022-07-03 LAB — CBC
HCT: 44.3 % (ref 39.0–52.0)
Hemoglobin: 14.5 g/dL (ref 13.0–17.0)
MCH: 30.1 pg (ref 26.0–34.0)
MCHC: 32.7 g/dL (ref 30.0–36.0)
MCV: 91.9 fL (ref 80.0–100.0)
Platelets: 378 10*3/uL (ref 150–400)
RBC: 4.82 MIL/uL (ref 4.22–5.81)
RDW: 13.6 % (ref 11.5–15.5)
WBC: 6.3 10*3/uL (ref 4.0–10.5)
nRBC: 0 % (ref 0.0–0.2)

## 2022-07-03 LAB — MAGNESIUM: Magnesium: 2.4 mg/dL (ref 1.7–2.4)

## 2022-07-03 LAB — BASIC METABOLIC PANEL
Anion gap: 6 (ref 5–15)
BUN: 15 mg/dL (ref 6–20)
CO2: 26 mmol/L (ref 22–32)
Calcium: 8.9 mg/dL (ref 8.9–10.3)
Chloride: 107 mmol/L (ref 98–111)
Creatinine, Ser: 1.09 mg/dL (ref 0.61–1.24)
GFR, Estimated: >60 mL/min (ref >60)
Glucose, Bld: 114 mg/dL — ABNORMAL HIGH (ref 70–99)
Potassium: 3.5 mmol/L (ref 3.5–5.1)
Sodium: 139 mmol/L (ref 135–145)

## 2022-07-03 MED ORDER — SACUBITRIL-VALSARTAN 49-51 MG PO TABS
1.0000 | ORAL_TABLET | Freq: Two times a day (BID) | ORAL | Status: DC
  Administered 2022-07-03 – 2022-07-04 (×2): 1 via ORAL
  Filled 2022-07-03 (×2): qty 1

## 2022-07-03 MED ORDER — SPIRONOLACTONE 25 MG PO TABS
100.0000 mg | ORAL_TABLET | Freq: Every day | ORAL | Status: DC
  Administered 2022-07-04: 100 mg via ORAL
  Filled 2022-07-03: qty 4

## 2022-07-03 MED ORDER — CARVEDILOL 6.25 MG PO TABS
6.2500 mg | ORAL_TABLET | Freq: Two times a day (BID) | ORAL | Status: DC
  Administered 2022-07-03 – 2022-07-04 (×2): 6.25 mg via ORAL
  Filled 2022-07-03 (×2): qty 1

## 2022-07-03 MED ORDER — ENOXAPARIN SODIUM 80 MG/0.8ML IJ SOSY
80.0000 mg | PREFILLED_SYRINGE | Freq: Every day | INTRAMUSCULAR | Status: DC
  Filled 2022-07-03 (×2): qty 0.8

## 2022-07-03 MED ORDER — CARVEDILOL 6.25 MG PO TABS
6.2500 mg | ORAL_TABLET | Freq: Two times a day (BID) | ORAL | Status: DC
  Administered 2022-07-03: 6.25 mg via ORAL
  Filled 2022-07-03: qty 1

## 2022-07-03 MED ORDER — EMPAGLIFLOZIN 10 MG PO TABS
10.0000 mg | ORAL_TABLET | Freq: Every day | ORAL | Status: DC
  Administered 2022-07-03 – 2022-07-04 (×2): 10 mg via ORAL
  Filled 2022-07-03 (×2): qty 1

## 2022-07-03 MED ORDER — CARVEDILOL 12.5 MG PO TABS: 25.0000 mg | ORAL_TABLET | Freq: Two times a day (BID) | ORAL | Status: DC

## 2022-07-03 NOTE — Progress Notes (Signed)
Rounding Note    Patient Name: Richard Hale Date of Encounter: 07/03/2022  [facility] Cardiologist: Rollene Rotunda, MD   Subjective   Breathing has improved. No recent chest pain. Patient says he was probably taking his medication wrong at home, he does not take the night time dose of his medications despite labetalol being twice a day. SBP at home around 180s on bad days and 150s on good days  Inpatient Medications    Scheduled Meds:  amLODipine  10 mg Oral Daily   Chlorhexidine Gluconate Cloth  6 each Topical Daily   furosemide  80 mg Intravenous BID   irbesartan  300 mg Oral Daily   spironolactone  50 mg Oral Daily   Continuous Infusions:  nitroGLYCERIN 15 mcg/min (07/02/22 1300)   PRN Meds: acetaminophen **OR** acetaminophen, butalbital-acetaminophen-caffeine, hydrALAZINE, nicotine, mouth rinse   Vital Signs    Vitals:   07/03/22 0400 07/03/22 0500 07/03/22 0700 07/03/22 0915  BP:   (!) 169/97 (!) 161/100  Pulse: 83 85    Resp: (!) 25 10    Temp: 98 F (36.7 C)  98.1 F (36.7 C)   TempSrc: Oral  Oral   SpO2: 95% 93% 99%   Weight:      Height:        Intake/Output Summary (Last 24 hours) at 07/03/2022 1014 Last data filed at 07/02/2022 1300 Gross per 24 hour  Intake 18.26 ml  Output --  Net 18.26 ml      07/02/2022    1:41 AM 07/01/2022    6:42 PM 01/01/2022    3:53 PM  Last 3 Weights  Weight (lbs) 402 lb 12.5 oz 406 lb 4.9 oz 406 lb 3.2 oz  Weight (kg) 182.7 kg 184.3 kg 184.251 kg      Telemetry    NSR without significant ventricular ectopy - Personally Reviewed  ECG    NSR without significant ST-T wave changes - Personally Reviewed  Physical Exam   GEN: No acute distress.   Neck: No JVD Cardiac: RRR, no murmurs, rubs, or gallops.  Respiratory: Clear to auscultation bilaterally. GI: Soft, nontender, non-distended  MS: No edema; No deformity. Neuro:  Nonfocal  Psych: Normal affect   Labs    High Sensitivity Troponin:    Recent Labs  Lab 07/01/22 1845 07/01/22 2045  TROPONINIHS 132* 115*     Chemistry Recent Labs  Lab 07/01/22 1845 07/02/22 0349 07/03/22 0619  NA 140 141 139  K 3.3* 3.5 3.5  CL 104 108 107  CO2 25 28 26   GLUCOSE 173* 110* 114*  BUN 11 12 15   CREATININE 1.02 1.08 1.09  CALCIUM 9.2 8.7* 8.9  MG  --  2.6* 2.4  PROT  --  7.1  --   ALBUMIN  --  3.6  --   AST  --  32  --   ALT  --  25  --   ALKPHOS  --  78  --   BILITOT  --  0.6  --   GFRNONAA >60 >60 >60  ANIONGAP 11 5 6     Lipids No results for input(s): "CHOL", "TRIG", "HDL", "LABVLDL", "LDLCALC", "CHOLHDL" in the last 168 hours.  Hematology Recent Labs  Lab 07/01/22 1845 07/02/22 0349 07/03/22 0619  WBC 8.1 7.5 6.3  RBC 4.57 4.42 4.82  HGB 13.8 13.3 14.5  HCT 41.3 41.0 44.3  MCV 90.4 92.8 91.9  MCH 30.2 30.1 30.1  MCHC 33.4 32.4 32.7  RDW 13.7 13.5  13.6  PLT 373 346 378   Thyroid  Recent Labs  Lab 07/02/22 0719  TSH 0.962    BNP Recent Labs  Lab 07/02/22 0719  BNP 166.7*    DDimer  Recent Labs  Lab 07/01/22 2023  DDIMER 0.64*     Radiology    ECHOCARDIOGRAM COMPLETE  Result Date: 07/02/2022    ECHOCARDIOGRAM REPORT   Patient Name:   JAKARIUS FLAMENCO Mcglocklin Date of Exam: 07/02/2022 Medical Rec #:  [id number]         Height:       74.0 in Accession #:    [id number]        Weight:       402.8 lb Date of Birth:  Aug 28, 1986         BSA:          2.927 m Patient Age:    36 years          BP:           154/116 mmHg Patient Gender: M                 HR:           81 bpm. Exam Location:  Inpatient Procedure: 2D Echo and Intracardiac Opacification Agent Indications:    CHF  History:        Patient has prior history of Echocardiogram examinations, most                 recent 04/01/2019. Cardiomyopathy and CHF, Signs/Symptoms:Chest                 Pain; Risk Factors:Current Smoker and Hypertension.  Sonographer:    Cathie Hoops Referring Phys: [id number] Angie Fava  Sonographer Comments: Technically difficult  study due to poor echo windows, no subcostal window and patient is obese. Image acquisition challenging due to patient body habitus. IMPRESSIONS  1. Left ventricular ejection fraction, by estimation, is 20 to 25%. The left ventricle has severely decreased function. The left ventricle demonstrates global hypokinesis. The left ventricular internal cavity size was moderately dilated. There is severe  concentric left ventricular hypertrophy. Indeterminate diastolic filling due to E-A fusion.  2. Right ventricular systolic function is normal. The right ventricular size is normal. Tricuspid regurgitation signal is inadequate for assessing PA pressure.  3. A small pericardial effusion is present. The pericardial effusion is circumferential.  4. The mitral valve is grossly normal. No evidence of mitral valve regurgitation. No evidence of mitral stenosis.  5. The aortic valve is tricuspid. Aortic valve regurgitation is not visualized. No aortic stenosis is present.  6. The inferior vena cava is normal in size with greater than 50% respiratory variability, suggesting right atrial pressure of 3 mmHg. Comparison(s): Changes from prior study are noted. The left ventricular function is significantly worse. FINDINGS  Left Ventricle: Left ventricular ejection fraction, by estimation, is 20 to 25%. The left ventricle has severely decreased function. The left ventricle demonstrates global hypokinesis. The left ventricular internal cavity size was moderately dilated. There is severe concentric left ventricular hypertrophy. Indeterminate diastolic filling due to E-A fusion. Right Ventricle: The right ventricular size is normal. No increase in right ventricular wall thickness. Right ventricular systolic function is normal. Tricuspid regurgitation signal is inadequate for assessing PA pressure. Left Atrium: Left atrial size was normal in size. Right Atrium: Right atrial size was not well visualized. Pericardium: A small pericardial  effusion is present. The pericardial effusion is circumferential. Mitral Valve: The mitral  valve is grossly normal. No evidence of mitral valve regurgitation. No evidence of mitral valve stenosis. Tricuspid Valve: The tricuspid valve is grossly normal. Tricuspid valve regurgitation is not demonstrated. No evidence of tricuspid stenosis. Aortic Valve: The aortic valve is tricuspid. Aortic valve regurgitation is not visualized. No aortic stenosis is present. Aortic valve mean gradient measures 3.0 mmHg. Aortic valve peak gradient measures 5.6 mmHg. Aortic valve area, by VTI measures 1.97 cm. Pulmonic Valve: The pulmonic valve was grossly normal. Pulmonic valve regurgitation is not visualized. No evidence of pulmonic stenosis. Aorta: The aortic root and ascending aorta are structurally normal, with no evidence of dilitation. Venous: The inferior vena cava is normal in size with greater than 50% respiratory variability, suggesting right atrial pressure of 3 mmHg. IAS/Shunts: The interatrial septum was not well visualized.  LEFT VENTRICLE PLAX 2D LVIDd:         6.40 cm      Diastology LVIDs:         6.00 cm      LV e' medial:    9.90 cm/s LV PW:         1.70 cm      LV E/e' medial:  8.0 LV IVS:        1.80 cm      LV e' lateral:   4.22 cm/s LVOT diam:     2.40 cm      LV E/e' lateral: 18.7 LV SV:         47 LV SV Index:   16 LVOT Area:     4.52 cm  LV Volumes (MOD) LV vol d, MOD A2C: 239.0 ml LV vol d, MOD A4C: 341.0 ml LV vol s, MOD A2C: 189.0 ml LV vol s, MOD A4C: 257.0 ml LV SV MOD A2C:     50.0 ml LV SV MOD A4C:     341.0 ml LV SV MOD BP:      70.8 ml RIGHT VENTRICLE RV Basal diam:  4.00 cm RV Mid diam:    3.50 cm RV S prime:     14.10 cm/s TAPSE (M-mode): 2.3 cm LEFT ATRIUM             Index        RIGHT ATRIUM           Index LA diam:        5.70 cm 1.95 cm/m   RA Area:     13.70 cm LA Vol (A2C):   76.6 ml 26.17 ml/m  RA Volume:   32.50 ml  11.10 ml/m LA Vol (A4C):   90.3 ml 30.85 ml/m LA Biplane Vol: 86.7  ml 29.62 ml/m  AORTIC VALVE                    PULMONIC VALVE AV Area (Vmax):    2.22 cm     PV Vmax:       1.26 m/s AV Area (Vmean):   2.24 cm     PV Peak grad:  6.4 mmHg AV Area (VTI):     1.97 cm AV Vmax:           118.00 cm/s AV Vmean:          78.700 cm/s AV VTI:            0.239 m AV Peak Grad:      5.6 mmHg AV Mean Grad:      3.0 mmHg LVOT Vmax:  57.80 cm/s LVOT Vmean:        39.000 cm/s LVOT VTI:          0.104 m LVOT/AV VTI ratio: 0.44  AORTA Ao Root diam: 3.70 cm Ao Asc diam:  3.50 cm MITRAL VALVE MV Area (PHT): 4.41 cm    SHUNTS MV Decel Time: 172 msec    Systemic VTI:  0.10 m MR Peak grad: 27.1 mmHg    Systemic Diam: 2.40 cm MR Vmax:      260.50 cm/s MV E velocity: 78.80 cm/s MV A velocity: 57.80 cm/s MV E/A ratio:  1.36 Lennie Odor MD Electronically signed by Lennie Odor MD Signature Date/Time: 07/02/2022/11:26:09 AM    Final    CT Angio Chest PE W and/or Wo Contrast  Result Date: 07/01/2022 CLINICAL DATA:  Shortness of breath and nonproductive cough. EXAM: CT ANGIOGRAPHY CHEST WITH CONTRAST TECHNIQUE: Multidetector CT imaging of the chest was performed using the standard protocol during bolus administration of intravenous contrast. Multiplanar CT image reconstructions and MIPs were obtained to evaluate the vascular anatomy. RADIATION DOSE REDUCTION: This exam was performed according to the departmental dose-optimization program which includes automated exposure control, adjustment of the mA and/or kV according to patient size and/or use of iterative reconstruction technique. CONTRAST:  78mL OMNIPAQUE IOHEXOL 350 MG/ML SOLN COMPARISON:  June 21, 2022 FINDINGS: Cardiovascular: The subsegmental pulmonary arteries are limited in evaluation secondary to suboptimal opacification with intravenous contrast. No evidence of pulmonary embolism. There is mild to moderate severity cardiomegaly. No pericardial effusion. Mediastinum/Nodes: There is mild bilateral lymphadenopathy. Thyroid  gland, trachea, and esophagus demonstrate no significant findings. Lungs/Pleura: A small, stable area of atelectasis and/or infiltrate is seen within the inferolateral aspect of the right upper lobe. No pleural effusion or pneumothorax. Upper Abdomen: No acute abnormality. Musculoskeletal: No chest wall abnormality. No acute or significant osseous findings. Review of the MIP images confirms the above findings. IMPRESSION: 1. No evidence of pulmonary embolism. 2. Mild, stable inferior right upper lobe atelectasis and/or infiltrate. 3. Mild to moderate severity cardiomegaly. Electronically Signed   By: Aram Candela M.D.   On: 07/01/2022 22:05   DG Chest 2 View  Result Date: 07/01/2022 CLINICAL DATA:  Chest pain and shortness of breath EXAM: CHEST - 2 VIEW COMPARISON:  06/21/2022 FINDINGS: Cardiac shadow is mildly enlarged but stable. Lungs are well aerated bilaterally. No focal infiltrate or effusion is seen. Mild central vascular congestion is seen although improved from the prior study. No bony abnormality is noted. IMPRESSION: Mild vascular congestion without focal infiltrate. Electronically Signed   By: Alcide Clever M.D.   On: 07/01/2022 19:07    Cardiac Studies   Echo 04/07/2019 1. The left ventricle has a visually estimated ejection fraction of 45%.  The cavity size was mildly dilated. There is moderately increased left  ventricular wall thickness. Left ventricular diastolic Doppler parameters  are consistent with  pseudonormalization. Left ventrical global hypokinesis without regional  wall motion abnormalities.   2. The right ventricle has normal systolic function. The cavity was  normal. There is no increase in right ventricular wall thickness.   3. No evidence of mitral valve stenosis. No mitral regurgitation.   4. The aortic valve is tricuspid. No stenosis of the aortic valve.   5. The aortic root is normal in size and structure. No complete TR  doppler jet so unable to estimate PA  systolic pressure.    Echo 07/02/2022  1. Left ventricular ejection fraction, by estimation, is 20 to 25%.  The  left ventricle has severely decreased function. The left ventricle  demonstrates global hypokinesis. The left ventricular internal cavity size  was moderately dilated. There is severe   concentric left ventricular hypertrophy. Indeterminate diastolic filling  due to E-A fusion.   2. Right ventricular systolic function is normal. The right ventricular  size is normal. Tricuspid regurgitation signal is inadequate for assessing  PA pressure.   3. A small pericardial effusion is present. The pericardial effusion is  circumferential.   4. The mitral valve is grossly normal. No evidence of mitral valve  regurgitation. No evidence of mitral stenosis.   5. The aortic valve is tricuspid. Aortic valve regurgitation is not  visualized. No aortic stenosis is present.   6. The inferior vena cava is normal in size with greater than 50%  respiratory variability, suggesting right atrial pressure of 3 mmHg.   Comparison(s): Changes from prior study are noted. The left ventricular  function is significantly worse.   Patient Profile     36 y.o. male with PMH of chronic systolic CHF, HTN, chronic tobacco abuse, OSA not on CPAP and medication noncompliance who presented with SOB, cough and chest pain.   Assessment & Plan    Acute on chronic combined systolic and diastolic CHF  - Echo in June 2020 showed EF 45%. Repeat echo 07/02/2022 EF 20-25%, global hypokinesis, small pericardial effusion, no significant valve issue  - suspected worsening EF due to chronically uncontrolled HTN  - patient appears to be near euvolemic level. I/O not accurate as per nurse patient used bathroom several times, weight however came down from 406 lbs down to 390 lbs this morning. Suspect patient can be transitioned to 40mg  PO lasix daily starting tomorrow.   HTN  - home clonidine discontinued. Continue  spironolactone. SBP remain in the 170s, transition irbesartan to entresto, potentially increase entresto dose tomorrow if BP remain high. Add coreg 6.25mg  BID, uptitrate if BP allows. SBP goal 110-120s.       For questions or updates, please contact [place] Please consult [email or web address] for contact info under        Signed, Almyra Deforest, [place]  07/03/2022, 10:14 AM

## 2022-07-03 NOTE — Progress Notes (Signed)
Patient informed this nurse that he requires a work note from one of his physicians to show he was hospitalized prior to being discharged. This nurse will relay this request to this patient's day shift nurse for continuity of care.

## 2022-07-03 NOTE — Progress Notes (Signed)
PROGRESS NOTE    Richard Hale  WNI:[id number] DOB: 11/21/1985 DOA: 07/01/2022 PCP: Ladell Pier, MD    Chief Complaint  Patient presents with   Shortness of Breath    Brief Narrative:   Richard Hale is a 36 y.o. male with medical history significant for chronic systolic heart failure, essential hypertension, chronic tobacco abuse, who is admitted to [facility] on 07/01/2022 by way of transfer from [facility] emergency department with suspected acute on chronic systolic heart failure after presenting from home to the latter facility complaining of shortness of breath.      He was admitted for acute CHF, and hypertensive crisis.    Assessment & Plan:   Principal Problem:   Acute on chronic systolic CHF (congestive heart failure) (HCC) Active Problems:   Essential hypertension   Obesity, Class III, BMI 40-49.9 (morbid obesity) (HCC)   Tobacco abuse   Hypokalemia   Elevated troponin   Acute on chronic systolic CHF/NICM: ECHO showed Left ventricular ejection fraction, by estimation, is 20 to 25%. The  left ventricle has severely decreased function. The left ventricle  demonstrates global hypokinesis. The left ventricular internal cavity size was moderately dilated. There is severe  concentric left ventricular hypertrophy. Indeterminate diastolic filling  due to E-A fusion. Secondary to uncontrolled hypertension;  Started the patient on IV lasix 80 mg BID, Continue with Entresto, spironolactone, Jardiance Continue with strict intake and output. Weight dropped to 390 today,.  Cardiology on board and appreciate recommendations.     Uncontrolled Hypertension;  BP parameters are still high.  - continue with coreg, Norvasc, entresto. Spironolactone.    Hypokalemia: replaced.    Morbid Obesity.  Body mass index is 50.19 kg/m.  Tobacco abuse:  On nicotine patch.     Elevated troponins:  Demand ischemia from CHF.     DVT prophylaxis:  Code  Status: Full code.  Family Communication: none at bedside.  Disposition:   Status is: Inpatient Remains inpatient appropriate because: IV lasix.    Level of care: Telemetry Consultants:  Cardiology.  Procedures: echocardiogram.   Antimicrobials: none.    Subjective: Reports feeling better.   Objective: Vitals:   07/03/22 1000 07/03/22 1033 07/03/22 1056 07/03/22 1100  BP: (!) 172/104  (!) 158/85 (!) 149/104  Pulse: 88  77 89  Resp: 19   (!) 22  Temp:    98.1 F (36.7 C)  TempSrc:    Oral  SpO2: 93%   97%  Weight:  (!) 177.3 kg    Height:        Intake/Output Summary (Last 24 hours) at 07/03/2022 1307 Last data filed at 07/03/2022 1100 Gross per 24 hour  Intake 480 ml  Output --  Net 480 ml   Filed Weights   07/01/22 1842 07/02/22 0141 07/03/22 1033  Weight: (!) 184.3 kg (!) 182.7 kg (!) 177.3 kg    Examination:  General exam: Appears calm and comfortable  Respiratory system: Clear to auscultation. Respiratory effort normal. Cardiovascular system: S1 & S2 heard, RRR. Pedal edema present.  Gastrointestinal system: Abdomen is nondistended, soft and nontender. \Normal bowel sounds heard. Central nervous system: Alert and oriented. No focal neurological deficits. Extremities: Symmetric 5 x 5 power. Skin: No rashes, lesions or ulcers Psychiatry: \ Mood & affect appropriate.     Data Reviewed: I have personally reviewed following labs and imaging studies  CBC: Recent Labs  Lab 07/01/22 1845 07/02/22 0349 07/03/22 0619  WBC 8.1 7.5 6.3  NEUTROABS  --  2.4  --   HGB 13.8 13.3 14.5  HCT 41.3 41.0 44.3  MCV 90.4 92.8 91.9  PLT 373 346 378    Basic Metabolic Panel: Recent Labs  Lab 07/01/22 1845 07/02/22 0349 07/03/22 0619  NA 140 141 139  K 3.3* 3.5 3.5  CL 104 108 107  CO2 25 28 26   GLUCOSE 173* 110* 114*  BUN 11 12 15   CREATININE 1.02 1.08 1.09  CALCIUM 9.2 8.7* 8.9  MG  --  2.6* 2.4  PHOS  --  3.4  --     GFR: Estimated Creatinine  Clearance: 160.8 mL/min (by C-G formula based on SCr of 1.09 mg/dL).  Liver Function Tests: Recent Labs  Lab 07/02/22 0349  AST 32  ALT 25  ALKPHOS 78  BILITOT 0.6  PROT 7.1  ALBUMIN 3.6    CBG: No results for input(s): "GLUCAP" in the last 168 hours.   Recent Results (from the past 240 hour(s))  MRSA Next Gen by PCR, Nasal     Status: None   Collection Time: 07/02/22  1:41 AM   Specimen: Nasal Mucosa; Nasal Swab  Result Value Ref Range Status   MRSA by PCR Next Gen NOT DETECTED NOT DETECTED Final    Comment: (NOTE) The GeneXpert MRSA Assay (FDA approved for NASAL specimens only), is one component of a comprehensive MRSA colonization surveillance program. It is not intended to diagnose MRSA infection nor to guide or monitor treatment for MRSA infections. Test performance is not FDA approved in patients less than 73 years old. Performed at [facility], [address], [place], [place]          Radiology Studies: ECHOCARDIOGRAM COMPLETE  Result Date: 07/02/2022    ECHOCARDIOGRAM REPORT   Patient Name:   Richard Hale Carico Date of Exam: 07/02/2022 Medical Rec #:  Michaelene Song         Height:       74.0 in Accession #:    07/04/2022        Weight:       402.8 lb Date of Birth:  August 11, 1986         BSA:          2.927 m Patient Age:    36 years          BP:           154/116 mmHg Patient Gender: M                 HR:           81 bpm. Exam Location:  Inpatient Procedure: 2D Echo and Intracardiac Opacification Agent Indications:    CHF  History:        Patient has prior history of Echocardiogram examinations, most                 recent 04/01/2019. Cardiomyopathy and CHF, Signs/Symptoms:Chest                 Pain; Risk Factors:Current Smoker and Hypertension.  Sonographer:    12-11-1984 Referring Phys: 04/03/2019 Cathie Hoops  Sonographer Comments: Technically difficult study due to poor echo windows, no subcostal window and patient is obese. Image  acquisition challenging due to patient body habitus. IMPRESSIONS  1. Left ventricular ejection fraction, by estimation, is 20 to 25%. The left ventricle has severely decreased function. The left ventricle demonstrates global hypokinesis. The left ventricular internal cavity size was moderately dilated. There is severe  concentric left ventricular hypertrophy. Indeterminate diastolic filling due to E-A fusion.  2. Right ventricular systolic function is normal. The right ventricular size is normal. Tricuspid regurgitation signal is inadequate for assessing PA pressure.  3. A small pericardial effusion is present. The pericardial effusion is circumferential.  4. The mitral valve is grossly normal. No evidence of mitral valve regurgitation. No evidence of mitral stenosis.  5. The aortic valve is tricuspid. Aortic valve regurgitation is not visualized. No aortic stenosis is present.  6. The inferior vena cava is normal in size with greater than 50% respiratory variability, suggesting right atrial pressure of 3 mmHg. Comparison(s): Changes from prior study are noted. The left ventricular function is significantly worse. FINDINGS  Left Ventricle: Left ventricular ejection fraction, by estimation, is 20 to 25%. The left ventricle has severely decreased function. The left ventricle demonstrates global hypokinesis. The left ventricular internal cavity size was moderately dilated. There is severe concentric left ventricular hypertrophy. Indeterminate diastolic filling due to E-A fusion. Right Ventricle: The right ventricular size is normal. No increase in right ventricular wall thickness. Right ventricular systolic function is normal. Tricuspid regurgitation signal is inadequate for assessing PA pressure. Left Atrium: Left atrial size was normal in size. Right Atrium: Right atrial size was not well visualized. Pericardium: A small pericardial effusion is present. The pericardial effusion is circumferential. Mitral Valve: The  mitral valve is grossly normal. No evidence of mitral valve regurgitation. No evidence of mitral valve stenosis. Tricuspid Valve: The tricuspid valve is grossly normal. Tricuspid valve regurgitation is not demonstrated. No evidence of tricuspid stenosis. Aortic Valve: The aortic valve is tricuspid. Aortic valve regurgitation is not visualized. No aortic stenosis is present. Aortic valve mean gradient measures 3.0 mmHg. Aortic valve peak gradient measures 5.6 mmHg. Aortic valve area, by VTI measures 1.97 cm. Pulmonic Valve: The pulmonic valve was grossly normal. Pulmonic valve regurgitation is not visualized. No evidence of pulmonic stenosis. Aorta: The aortic root and ascending aorta are structurally normal, with no evidence of dilitation. Venous: The inferior vena cava is normal in size with greater than 50% respiratory variability, suggesting right atrial pressure of 3 mmHg. IAS/Shunts: The interatrial septum was not well visualized.  LEFT VENTRICLE PLAX 2D LVIDd:         6.40 cm      Diastology LVIDs:         6.00 cm      LV e' medial:    9.90 cm/s LV PW:         1.70 cm      LV E/e' medial:  8.0 LV IVS:        1.80 cm      LV e' lateral:   4.22 cm/s LVOT diam:     2.40 cm      LV E/e' lateral: 18.7 LV SV:         47 LV SV Index:   16 LVOT Area:     4.52 cm  LV Volumes (MOD) LV vol d, MOD A2C: 239.0 ml LV vol d, MOD A4C: 341.0 ml LV vol s, MOD A2C: 189.0 ml LV vol s, MOD A4C: 257.0 ml LV SV MOD A2C:     50.0 ml LV SV MOD A4C:     341.0 ml LV SV MOD BP:      70.8 ml RIGHT VENTRICLE RV Basal diam:  4.00 cm RV Mid diam:    3.50 cm RV S prime:     14.10 cm/s TAPSE (M-mode): 2.3 cm  LEFT ATRIUM             Index        RIGHT ATRIUM           Index LA diam:        5.70 cm 1.95 cm/m   RA Area:     13.70 cm LA Vol (A2C):   76.6 ml 26.17 ml/m  RA Volume:   32.50 ml  11.10 ml/m LA Vol (A4C):   90.3 ml 30.85 ml/m LA Biplane Vol: 86.7 ml 29.62 ml/m  AORTIC VALVE                    PULMONIC VALVE AV Area (Vmax):     2.22 cm     PV Vmax:       1.26 m/s AV Area (Vmean):   2.24 cm     PV Peak grad:  6.4 mmHg AV Area (VTI):     1.97 cm AV Vmax:           118.00 cm/s AV Vmean:          78.700 cm/s AV VTI:            0.239 m AV Peak Grad:      5.6 mmHg AV Mean Grad:      3.0 mmHg LVOT Vmax:         57.80 cm/s LVOT Vmean:        39.000 cm/s LVOT VTI:          0.104 m LVOT/AV VTI ratio: 0.44  AORTA Ao Root diam: 3.70 cm Ao Asc diam:  3.50 cm MITRAL VALVE MV Area (PHT): 4.41 cm    SHUNTS MV Decel Time: 172 msec    Systemic VTI:  0.10 m MR Peak grad: 27.1 mmHg    Systemic Diam: 2.40 cm MR Vmax:      260.50 cm/s MV E velocity: 78.80 cm/s MV A velocity: 57.80 cm/s MV E/A ratio:  1.36 Lennie Odor MD Electronically signed by Lennie Odor MD Signature Date/Time: 07/02/2022/11:26:09 AM    Final    CT Angio Chest PE W and/or Wo Contrast  Result Date: 07/01/2022 CLINICAL DATA:  Shortness of breath and nonproductive cough. EXAM: CT ANGIOGRAPHY CHEST WITH CONTRAST TECHNIQUE: Multidetector CT imaging of the chest was performed using the standard protocol during bolus administration of intravenous contrast. Multiplanar CT image reconstructions and MIPs were obtained to evaluate the vascular anatomy. RADIATION DOSE REDUCTION: This exam was performed according to the departmental dose-optimization program which includes automated exposure control, adjustment of the mA and/or kV according to patient size and/or use of iterative reconstruction technique. CONTRAST:  68mL OMNIPAQUE IOHEXOL 350 MG/ML SOLN COMPARISON:  June 21, 2022 FINDINGS: Cardiovascular: The subsegmental pulmonary arteries are limited in evaluation secondary to suboptimal opacification with intravenous contrast. No evidence of pulmonary embolism. There is mild to moderate severity cardiomegaly. No pericardial effusion. Mediastinum/Nodes: There is mild bilateral lymphadenopathy. Thyroid gland, trachea, and esophagus demonstrate no significant findings. Lungs/Pleura: A  small, stable area of atelectasis and/or infiltrate is seen within the inferolateral aspect of the right upper lobe. No pleural effusion or pneumothorax. Upper Abdomen: No acute abnormality. Musculoskeletal: No chest wall abnormality. No acute or significant osseous findings. Review of the MIP images confirms the above findings. IMPRESSION: 1. No evidence of pulmonary embolism. 2. Mild, stable inferior right upper lobe atelectasis and/or infiltrate. 3. Mild to moderate severity cardiomegaly. Electronically Signed   By: Aram Candela M.D.   On:  07/01/2022 22:05   DG Chest 2 View  Result Date: 07/01/2022 CLINICAL DATA:  Chest pain and shortness of breath EXAM: CHEST - 2 VIEW COMPARISON:  06/21/2022 FINDINGS: Cardiac shadow is mildly enlarged but stable. Lungs are well aerated bilaterally. No focal infiltrate or effusion is seen. Mild central vascular congestion is seen although improved from the prior study. No bony abnormality is noted. IMPRESSION: Mild vascular congestion without focal infiltrate. Electronically Signed   By: Alcide Clever M.D.   On: 07/01/2022 19:07        Scheduled Meds:  amLODipine  10 mg Oral Daily   carvedilol  6.25 mg Oral BID WC   Chlorhexidine Gluconate Cloth  6 each Topical Daily   empagliflozin  10 mg Oral Daily   furosemide  80 mg Intravenous BID   sacubitril-valsartan  1 tablet Oral BID   [START ON 07/04/2022] spironolactone  100 mg Oral Daily   Continuous Infusions:  nitroGLYCERIN 15 mcg/min (07/02/22 1300)     LOS: 1 day        Kathlen Mody, MD [facility]   To contact the attending provider between 7A-7P or the covering provider during after hours 7P-7A, please log into the web site [email or web address] and access using universal [place] password for that web site. If you do not have the password, please call the hospital operator.  07/03/2022, 1:07 PM

## 2022-07-03 NOTE — Progress Notes (Signed)
  Transition of Care (TOC) Screening Note   Patient Details  Name: LYNDALL WINDT Date of Birth: Feb 17, 1986   Transition of Care [facility]) CM/SW Contact:    Vassie Moselle, LCSW Phone Number: 07/03/2022, 8:54 AM    Transition of Care Department [facility]) has reviewed patient and no TOC needs have been identified at this time. We will continue to monitor patient advancement through interdisciplinary progression rounds. If new patient transition needs arise, please place a TOC consult.

## 2022-07-03 NOTE — Progress Notes (Addendum)
Per Dr. Phineas Inches, will arrange CHF [facility] clinic follow up and also outpatient cardiac MRI, staff message sent to arrange both.   Addendum: Patient actually has a follow up with Dr. Percival Spanish in 6 days, will make that TOC follow up instead

## 2022-07-03 NOTE — Progress Notes (Signed)
Patient refusing to leave blood pressure cuff on this shift. Educated on need for blood pressure monitoring. Patient continues to remove cuff. Did allow spot check x1 then removed cuff again. Provider aware

## 2022-07-04 DIAGNOSIS — I161 Hypertensive emergency: Secondary | ICD-10-CM | POA: Diagnosis not present

## 2022-07-04 DIAGNOSIS — R778 Other specified abnormalities of plasma proteins: Secondary | ICD-10-CM | POA: Diagnosis not present

## 2022-07-04 DIAGNOSIS — I5023 Acute on chronic systolic (congestive) heart failure: Secondary | ICD-10-CM | POA: Diagnosis not present

## 2022-07-04 DIAGNOSIS — I10 Essential (primary) hypertension: Secondary | ICD-10-CM | POA: Diagnosis not present

## 2022-07-04 MED ORDER — FUROSEMIDE 40 MG PO TABS: 80.0000 mg | ORAL_TABLET | Freq: Every day | ORAL | Status: DC

## 2022-07-04 MED ORDER — EMPAGLIFLOZIN 10 MG PO TABS: 10.0000 mg | ORAL_TABLET | Freq: Every day | ORAL | 3 refills | Status: DC

## 2022-07-04 MED ORDER — CARVEDILOL 6.25 MG PO TABS: 6.2500 mg | ORAL_TABLET | Freq: Two times a day (BID) | ORAL | 3 refills | Status: DC

## 2022-07-04 MED ORDER — FUROSEMIDE 80 MG PO TABS: 80.0000 mg | ORAL_TABLET | Freq: Every day | ORAL | 3 refills | Status: DC

## 2022-07-04 MED ORDER — SPIRONOLACTONE 100 MG PO TABS: 100.0000 mg | ORAL_TABLET | Freq: Every day | ORAL | 3 refills | Status: DC

## 2022-07-04 MED ORDER — SACUBITRIL-VALSARTAN 49-51 MG PO TABS: 1.0000 | ORAL_TABLET | Freq: Two times a day (BID) | ORAL | 3 refills | Status: DC

## 2022-07-04 MED ORDER — AMLODIPINE BESYLATE 10 MG PO TABS: 10.0000 mg | ORAL_TABLET | Freq: Every day | ORAL | 3 refills | Status: DC

## 2022-07-04 NOTE — Progress Notes (Addendum)
Rounding Note    Patient Name: Richard Hale Date of Encounter: 07/04/2022  [place] Cardiologist: Minus Breeding, MD   Subjective   He feels back to his baseline. He feels that he is doing well. He is ready to go home. We discussed that his heart failure is worse. We discussed the importance of GDMT  Inpatient Medications    Scheduled Meds:  amLODipine  10 mg Oral Daily   carvedilol  6.25 mg Oral BID WC   Chlorhexidine Gluconate Cloth  6 each Topical Daily   empagliflozin  10 mg Oral Daily   enoxaparin (LOVENOX) injection  80 mg Subcutaneous Daily   furosemide  80 mg Intravenous BID   sacubitril-valsartan  1 tablet Oral BID   spironolactone  100 mg Oral Daily   Continuous Infusions:  nitroGLYCERIN 15 mcg/min (07/02/22 1300)   PRN Meds: acetaminophen **OR** acetaminophen, butalbital-acetaminophen-caffeine, hydrALAZINE, nicotine, mouth rinse   Vital Signs    Vitals:   07/04/22 0753 07/04/22 0800 07/04/22 0900 07/04/22 0920  BP: (!) 171/118   (!) 148/72  Pulse: 83 88 84 86  Resp: (!) 30 (!) 23 (!) 25 20  Temp:      TempSrc:      SpO2: 99% 94% 96% 93%  Weight:      Height:        Intake/Output Summary (Last 24 hours) at 07/04/2022 0957 Last data filed at 07/03/2022 2000 Gross per 24 hour  Intake 480 ml  Output 700 ml  Net -220 ml      07/04/2022    5:00 AM 07/03/2022   10:33 AM 07/02/2022    1:41 AM  Last 3 Weights  Weight (lbs) 397 lb 7.8 oz 390 lb 14 oz 402 lb 12.5 oz  Weight (kg) 180.3 kg 177.3 kg 182.7 kg      Telemetry    NSR- Personally Reviewed  ECG    NA- Personally Reviewed  Physical Exam   GEN: No acute distress.   Neck: No JVD Cardiac: RRR, no murmurs, rubs, or gallops.  Respiratory: Clear to auscultation bilaterally. GI: Soft, nontender, non-distended  MS: No edema; No deformity. Neuro:  Nonfocal  Psych: Normal affect   Labs    High Sensitivity Troponin:   Recent Labs  Lab 07/01/22 1845 07/01/22 2045   TROPONINIHS 132* 115*     Chemistry Recent Labs  Lab 07/01/22 1845 07/02/22 0349 07/03/22 0619  NA 140 141 139  K 3.3* 3.5 3.5  CL 104 108 107  CO2 25 28 26   GLUCOSE 173* 110* 114*  BUN 11 12 15   CREATININE 1.02 1.08 1.09  CALCIUM 9.2 8.7* 8.9  MG  --  2.6* 2.4  PROT  --  7.1  --   ALBUMIN  --  3.6  --   AST  --  32  --   ALT  --  25  --   ALKPHOS  --  78  --   BILITOT  --  0.6  --   GFRNONAA >60 >60 >60  ANIONGAP 11 5 6     Lipids No results for input(s): "CHOL", "TRIG", "HDL", "LABVLDL", "LDLCALC", "CHOLHDL" in the last 168 hours.  Hematology Recent Labs  Lab 07/01/22 1845 07/02/22 0349 07/03/22 0619  WBC 8.1 7.5 6.3  RBC 4.57 4.42 4.82  HGB 13.8 13.3 14.5  HCT 41.3 41.0 44.3  MCV 90.4 92.8 91.9  MCH 30.2 30.1 30.1  MCHC 33.4 32.4 32.7  RDW 13.7 13.5 13.6  PLT  373 346 378   Thyroid  Recent Labs  Lab 07/02/22 0719  TSH 0.962    BNP Recent Labs  Lab 07/02/22 0719  BNP 166.7*    DDimer  Recent Labs  Lab 07/01/22 2023  DDIMER 0.64*     Radiology    No results found.  Cardiac Studies   Echo 04/07/2019 1. The left ventricle has a visually estimated ejection fraction of 45%.  The cavity size was mildly dilated. There is moderately increased left  ventricular wall thickness. Left ventricular diastolic Doppler parameters  are consistent with  pseudonormalization. Left ventrical global hypokinesis without regional  wall motion abnormalities.   2. The right ventricle has normal systolic function. The cavity was  normal. There is no increase in right ventricular wall thickness.   3. No evidence of mitral valve stenosis. No mitral regurgitation.   4. The aortic valve is tricuspid. No stenosis of the aortic valve.   5. The aortic root is normal in size and structure. No complete TR  doppler jet so unable to estimate PA systolic pressure.    Echo 07/02/2022  1. Left ventricular ejection fraction, by estimation, is 20 to 25%. The  left ventricle  has severely decreased function. The left ventricle  demonstrates global hypokinesis. The left ventricular internal cavity size  was moderately dilated. There is severe   concentric left ventricular hypertrophy. Indeterminate diastolic filling  due to E-A fusion.   2. Right ventricular systolic function is normal. The right ventricular  size is normal. Tricuspid regurgitation signal is inadequate for assessing  PA pressure.   3. A small pericardial effusion is present. The pericardial effusion is  circumferential.   4. The mitral valve is grossly normal. No evidence of mitral valve  regurgitation. No evidence of mitral stenosis.   5. The aortic valve is tricuspid. Aortic valve regurgitation is not  visualized. No aortic stenosis is present.   6. The inferior vena cava is normal in size with greater than 50%  respiratory variability, suggesting right atrial pressure of 3 mmHg.   Comparison(s): Changes from prior study are noted. The left ventricular  function is significantly worse.   Patient Profile     36 y.o. male with PMH of chronic systolic CHF, HTN, chronic tobacco abuse, OSA not on CPAP and medication noncompliance who presented with SOB, cough and chest pain here with CHF exacerbation  Assessment & Plan    Systolic HF: c/f NICM. EF 45%, now closer to 20% and dilated. C/f HTN related with severe LVH IVS thickness 1.8 cm. He is euvolemic with IV diuresis.  His crt is normal. -transition to oral lasix 80 mg daily - continue entresto 49-51 mg BID -continue spironolactone 100 mg daily -cont jardiance 10 mg daily -continue coreg 6.25 mg BID -HF transition appt planned for FU -can follow up echo in 3 months on GDMT, if not improved can consider primary prevention ICD -planned for outpatient MRI, waist circumference may be too large though, can try. SPECT is not great either with his body habitus and significant artifact risk   HTN -stop home clonidine, irbesartan,  labetalol -continue norvasc 10 mg daily -see above     He can be discharged from a cardiology standpoint. We arranging FU.   For questions or updates, please contact [facility] Please consult [email or web address] for contact info under        Signed, Maisie Fus, MD  07/04/2022, 9:57 AM

## 2022-07-04 NOTE — Progress Notes (Signed)
AVS instructions reviewed with pt per orders. All questions answered by RN at this time. All patient belongings returned to patient. PIV removed. Patient was taken downstairs in the wheelchair by this RN, where he was picked up by his wife.

## 2022-07-06 ENCOUNTER — Telehealth: Payer: Self-pay

## 2022-07-06 NOTE — Telephone Encounter (Signed)
Transition Care Management Unsuccessful Follow-up Telephone Call  Date of discharge and from where:  07/04/2022, [facility]  Attempts:  1st Attempt  Reason for unsuccessful TCM follow-up call:  Left voice message on [phone], call back requested.   Need to schedule follow up appointment with PCP

## 2022-07-07 ENCOUNTER — Telehealth: Payer: Self-pay

## 2022-07-07 NOTE — Discharge Summary (Signed)
Physician Discharge Summary   Patient: LAKEN FINCANNON MRN: [id number] DOB: 04-13-1986  Admit date:     07/01/2022  Discharge date: 07/04/22  Discharge Physician: Hosie Poisson   PCP: Ladell Pier, MD   Recommendations at discharge:  Please follow up with cardiology as scheduled.  Please follow up with PCP in one week.   Discharge Diagnoses: Principal Problem:   Acute on chronic systolic CHF (congestive heart failure) (HCC) Active Problems:   Essential hypertension   Obesity, Class III, BMI 40-49.9 (morbid obesity) ([place])   Tobacco abuse   Hypokalemia   Elevated troponin    Hospital Course:  Robinette is a 36 y.o. male with medical history significant for chronic systolic heart failure, essential hypertension, chronic tobacco abuse, who is admitted to [facility] on 07/01/2022 by way of transfer from [facility] emergency department with suspected acute on chronic systolic heart failure after presenting from home to the latter facility complaining of shortness of breath.      He was admitted for acute CHF, and hypertensive crisis.   Assessment and Plan:  Acute on chronic systolic CHF/NICM: ECHO showed Left ventricular ejection fraction, by estimation, is 20 to 25%. The  left ventricle has severely decreased function. The left ventricle  demonstrates global hypokinesis. The left ventricular internal cavity size was moderately dilated. There is severe  concentric left ventricular hypertrophy. Indeterminate diastolic filling  due to E-A fusion. Secondary to uncontrolled hypertension;  Started the patient on IV lasix 80 mg BID, transitioned to oral lasix.  Continue with Entresto, spironolactone, Jardiance Continue with strict intake and output. Weight dropped to 390 today,.  Cardiology on board and appreciate recommendations.        Uncontrolled Hypertension;  BP parameters are still high.  - continue with coreg, Norvasc, entresto. Spironolactone.       Hypokalemia: replaced.      Morbid Obesity.  Body mass index is 50.19 kg/m.   Tobacco abuse:  On nicotine patch.        Elevated troponins:  Demand ischemia from CHF.     Consultants: cardiology.  Procedures performed: echo  Disposition: Home Diet recommendation:  Discharge Diet Orders (From admission, onward)     Start     Ordered   07/04/22 0000  Diet - low sodium heart healthy        07/04/22 1015           Cardiac diet DISCHARGE MEDICATION: Allergies as of 07/04/2022       Reactions   Lisinopril Cough        Medication List     STOP taking these medications    amoxicillin 500 MG capsule Commonly known as: AMOXIL   azithromycin 250 MG tablet Commonly known as: Zithromax Z-Pak   buPROPion 150 MG 12 hr tablet Commonly known as: Wellbutrin SR   cloNIDine 0.1 MG tablet Commonly known as: CATAPRES   cloNIDine 0.3 mg/24hr patch Commonly known as: CATAPRES - Dosed in mg/24 hr   hydrochlorothiazide 25 MG tablet Commonly known as: HYDRODIURIL   irbesartan 300 MG tablet Commonly known as: AVAPRO   labetalol 300 MG tablet Commonly known as: NORMODYNE   pantoprazole 40 MG tablet Commonly known as: PROTONIX   varenicline 0.5 MG X 11 & 1 MG X 42 tablet Commonly known as: CHANTIX PAK       TAKE these medications    amLODipine 10 MG tablet Commonly known as: NORVASC Take 1 tablet (10 mg total) by mouth daily.  carvedilol 6.25 MG tablet Commonly known as: COREG Take 1 tablet (6.25 mg total) by mouth 2 (two) times daily with a meal.   empagliflozin 10 MG Tabs tablet Commonly known as: JARDIANCE Take 1 tablet (10 mg total) by mouth daily.   furosemide 80 MG tablet Commonly known as: LASIX Take 1 tablet (80 mg total) by mouth daily.   sacubitril-valsartan 49-51 MG Commonly known as: ENTRESTO Take 1 tablet by mouth 2 (two) times daily.   spironolactone 100 MG tablet Commonly known as: ALDACTONE Take 1 tablet (100 mg total) by  mouth daily. What changed:  medication strength how much to take        Follow-up Information     Minus Breeding, MD Follow up on 07/09/2022.   Specialty: Cardiology Why: @10 :20AM. Cardiology follow up Contact information: [address] [place]  [address] [phone]                Discharge Exam: Filed Weights   07/02/22 0141 07/03/22 1033 07/04/22 0500  Weight: (!) 182.7 kg (!) 177.3 kg (!) 180.3 kg   General exam: Appears calm and comfortable  Respiratory system: Clear to auscultation. Respiratory effort normal. Cardiovascular system: S1 & S2 heard, RRR. No JVD, murmurs, rubs, gallops or clicks. No pedal edema. Gastrointestinal system: Abdomen is nondistended, soft and nontender. No organomegaly or masses felt. Normal bowel sounds heard. Central nervous system: Alert and oriented. No focal neurological deficits. Extremities: Symmetric 5 x 5 power. Skin: No rashes, lesions or ulcers Psychiatry: Judgement and insight appear normal. Mood & affect appropriate.    Condition at discharge: fair  The results of significant diagnostics from this hospitalization (including imaging, microbiology, ancillary and laboratory) are listed below for reference.   Imaging Studies: ECHOCARDIOGRAM COMPLETE  Result Date: 07/02/2022    ECHOCARDIOGRAM REPORT   Patient Name:   KYAIR DITOMMASO Garay Date of Exam: 07/02/2022 Medical Rec #:  [id number]         Height:       74.0 in Accession #:    [id number]        Weight:       402.8 lb Date of Birth:  [birth year]         BSA:          2.927 m Patient Age:    36 years          BP:           154/116 mmHg Patient Gender: M                 HR:           81 bpm. Exam Location:  Inpatient Procedure: 2D Echo and Intracardiac Opacification Agent Indications:    CHF  History:        Patient has prior history of Echocardiogram examinations, most                 recent 04/01/2019. Cardiomyopathy and CHF, Signs/Symptoms:Chest                  Pain; Risk Factors:Current Smoker and Hypertension.  Sonographer:    Harvie Junior Referring Phys: [id number] Rhetta Mura  Sonographer Comments: Technically difficult study due to poor echo windows, no subcostal window and patient is obese. Image acquisition challenging due to patient body habitus. IMPRESSIONS  1. Left ventricular ejection fraction, by estimation, is 20 to 25%. The left ventricle has severely decreased function. The left ventricle demonstrates global hypokinesis. The left  ventricular internal cavity size was moderately dilated. There is severe  concentric left ventricular hypertrophy. Indeterminate diastolic filling due to E-A fusion.  2. Right ventricular systolic function is normal. The right ventricular size is normal. Tricuspid regurgitation signal is inadequate for assessing PA pressure.  3. A small pericardial effusion is present. The pericardial effusion is circumferential.  4. The mitral valve is grossly normal. No evidence of mitral valve regurgitation. No evidence of mitral stenosis.  5. The aortic valve is tricuspid. Aortic valve regurgitation is not visualized. No aortic stenosis is present.  6. The inferior vena cava is normal in size with greater than 50% respiratory variability, suggesting right atrial pressure of 3 mmHg. Comparison(s): Changes from prior study are noted. The left ventricular function is significantly worse. FINDINGS  Left Ventricle: Left ventricular ejection fraction, by estimation, is 20 to 25%. The left ventricle has severely decreased function. The left ventricle demonstrates global hypokinesis. The left ventricular internal cavity size was moderately dilated. There is severe concentric left ventricular hypertrophy. Indeterminate diastolic filling due to E-A fusion. Right Ventricle: The right ventricular size is normal. No increase in right ventricular wall thickness. Right ventricular systolic function is normal. Tricuspid regurgitation signal is inadequate for  assessing PA pressure. Left Atrium: Left atrial size was normal in size. Right Atrium: Right atrial size was not well visualized. Pericardium: A small pericardial effusion is present. The pericardial effusion is circumferential. Mitral Valve: The mitral valve is grossly normal. No evidence of mitral valve regurgitation. No evidence of mitral valve stenosis. Tricuspid Valve: The tricuspid valve is grossly normal. Tricuspid valve regurgitation is not demonstrated. No evidence of tricuspid stenosis. Aortic Valve: The aortic valve is tricuspid. Aortic valve regurgitation is not visualized. No aortic stenosis is present. Aortic valve mean gradient measures 3.0 mmHg. Aortic valve peak gradient measures 5.6 mmHg. Aortic valve area, by VTI measures 1.97 cm. Pulmonic Valve: The pulmonic valve was grossly normal. Pulmonic valve regurgitation is not visualized. No evidence of pulmonic stenosis. Aorta: The aortic root and ascending aorta are structurally normal, with no evidence of dilitation. Venous: The inferior vena cava is normal in size with greater than 50% respiratory variability, suggesting right atrial pressure of 3 mmHg. IAS/Shunts: The interatrial septum was not well visualized.  LEFT VENTRICLE PLAX 2D LVIDd:         6.40 cm      Diastology LVIDs:         6.00 cm      LV e' medial:    9.90 cm/s LV PW:         1.70 cm      LV E/e' medial:  8.0 LV IVS:        1.80 cm      LV e' lateral:   4.22 cm/s LVOT diam:     2.40 cm      LV E/e' lateral: 18.7 LV SV:         47 LV SV Index:   16 LVOT Area:     4.52 cm  LV Volumes (MOD) LV vol d, MOD A2C: 239.0 ml LV vol d, MOD A4C: 341.0 ml LV vol s, MOD A2C: 189.0 ml LV vol s, MOD A4C: 257.0 ml LV SV MOD A2C:     50.0 ml LV SV MOD A4C:     341.0 ml LV SV MOD BP:      70.8 ml RIGHT VENTRICLE RV Basal diam:  4.00 cm RV Mid diam:    3.50 cm RV S  prime:     14.10 cm/s TAPSE (M-mode): 2.3 cm LEFT ATRIUM             Index        RIGHT ATRIUM           Index LA diam:        5.70 cm  1.95 cm/m   RA Area:     13.70 cm LA Vol (A2C):   76.6 ml 26.17 ml/m  RA Volume:   32.50 ml  11.10 ml/m LA Vol (A4C):   90.3 ml 30.85 ml/m LA Biplane Vol: 86.7 ml 29.62 ml/m  AORTIC VALVE                    PULMONIC VALVE AV Area (Vmax):    2.22 cm     PV Vmax:       1.26 m/s AV Area (Vmean):   2.24 cm     PV Peak grad:  6.4 mmHg AV Area (VTI):     1.97 cm AV Vmax:           118.00 cm/s AV Vmean:          78.700 cm/s AV VTI:            0.239 m AV Peak Grad:      5.6 mmHg AV Mean Grad:      3.0 mmHg LVOT Vmax:         57.80 cm/s LVOT Vmean:        39.000 cm/s LVOT VTI:          0.104 m LVOT/AV VTI ratio: 0.44  AORTA Ao Root diam: 3.70 cm Ao Asc diam:  3.50 cm MITRAL VALVE MV Area (PHT): 4.41 cm    SHUNTS MV Decel Time: 172 msec    Systemic VTI:  0.10 m MR Peak grad: 27.1 mmHg    Systemic Diam: 2.40 cm MR Vmax:      260.50 cm/s MV E velocity: 78.80 cm/s MV A velocity: 57.80 cm/s MV E/A ratio:  1.36 Eleonore Chiquito MD Electronically signed by Eleonore Chiquito MD Signature Date/Time: 07/02/2022/11:26:09 AM    Final    CT Angio Chest PE W and/or Wo Contrast  Result Date: 07/01/2022 CLINICAL DATA:  Shortness of breath and nonproductive cough. EXAM: CT ANGIOGRAPHY CHEST WITH CONTRAST TECHNIQUE: Multidetector CT imaging of the chest was performed using the standard protocol during bolus administration of intravenous contrast. Multiplanar CT image reconstructions and MIPs were obtained to evaluate the vascular anatomy. RADIATION DOSE REDUCTION: This exam was performed according to the departmental dose-optimization program which includes automated exposure control, adjustment of the mA and/or kV according to patient size and/or use of iterative reconstruction technique. CONTRAST:  10mL OMNIPAQUE IOHEXOL 350 MG/ML SOLN COMPARISON:  June 21, 2022 FINDINGS: Cardiovascular: The subsegmental pulmonary arteries are limited in evaluation secondary to suboptimal opacification with intravenous contrast. No evidence of  pulmonary embolism. There is mild to moderate severity cardiomegaly. No pericardial effusion. Mediastinum/Nodes: There is mild bilateral lymphadenopathy. Thyroid gland, trachea, and esophagus demonstrate no significant findings. Lungs/Pleura: A small, stable area of atelectasis and/or infiltrate is seen within the inferolateral aspect of the right upper lobe. No pleural effusion or pneumothorax. Upper Abdomen: No acute abnormality. Musculoskeletal: No chest wall abnormality. No acute or significant osseous findings. Review of the MIP images confirms the above findings. IMPRESSION: 1. No evidence of pulmonary embolism. 2. Mild, stable inferior right upper lobe atelectasis and/or infiltrate. 3. Mild to moderate severity cardiomegaly. Electronically  Signed   By: Virgina Norfolk M.D.   On: 07/01/2022 22:05   DG Chest 2 View  Result Date: 07/01/2022 CLINICAL DATA:  Chest pain and shortness of breath EXAM: CHEST - 2 VIEW COMPARISON:  06/21/2022 FINDINGS: Cardiac shadow is mildly enlarged but stable. Lungs are well aerated bilaterally. No focal infiltrate or effusion is seen. Mild central vascular congestion is seen although improved from the prior study. No bony abnormality is noted. IMPRESSION: Mild vascular congestion without focal infiltrate. Electronically Signed   By: Inez Catalina M.D.   On: 07/01/2022 19:07   CT Angio Chest PE W and/or Wo Contrast  Result Date: 06/21/2022 CLINICAL DATA:  36 year old male with shortness of breath and elevated D-dimer. EXAM: CT ANGIOGRAPHY CHEST WITH CONTRAST TECHNIQUE: Multidetector CT imaging of the chest was performed using the standard protocol during bolus administration of intravenous contrast. Multiplanar CT image reconstructions and MIPs were obtained to evaluate the vascular anatomy. RADIATION DOSE REDUCTION: This exam was performed according to the departmental dose-optimization program which includes automated exposure control, adjustment of the mA and/or kV  according to patient size and/or use of iterative reconstruction technique. CONTRAST:  123mL OMNIPAQUE IOHEXOL 350 MG/ML SOLN COMPARISON:  12/14/2018 CT FINDINGS: Cardiovascular: This is a technically satisfactory study. No pulmonary emboli are identified. Mild cardiomegaly is noted. There is no evidence of thoracic aortic aneurysm or pericardial effusion. Mediastinum/Nodes: No enlarged mediastinal, hilar, or axillary lymph nodes. Thyroid gland, trachea, and esophagus demonstrate no significant findings. Lungs/Pleura: A small area of ground-glass/possible airspace opacity within the inferolateral aspect of the RIGHT UPPER lobe (image 67: Series 6) is noted. There is no evidence of consolidation, mass, suspicious nodule, pleural effusion or pneumothorax. No interval change noted from the prior study. Upper Abdomen: No acute abnormality. Musculoskeletal: No acute or suspicious bony abnormalities are noted. Review of the MIP images confirms the above findings. IMPRESSION: 1. No evidence of pulmonary emboli. 2. Small area of ground-glass/possible airspace opacity within the inferolateral aspect of the RIGHT UPPER lobe, most likely representing a small area of atelectasis, infection or inflammation. 3. Mild cardiomegaly. Electronically Signed   By: Margarette Canada M.D.   On: 06/21/2022 13:28   DG Chest 2 View  Result Date: 06/21/2022 CLINICAL DATA:  Shortness of breath. Cold-like symptoms for 2 weeks. History of CHF. EXAM: CHEST - 2 VIEW COMPARISON:  Chest x-ray dated 10/17/2019 FINDINGS: Cardiomegaly. Central pulmonary vascular congestion and bilateral interstitial prominence, presumably interstitial edema, indicating CHF. Slightly more confluent opacity at the RIGHT lung base, pneumonia versus associated alveolar pulmonary edema. No pleural effusion or pneumothorax is seen. Osseous structures about the chest are unremarkable. IMPRESSION: 1. Cardiomegaly with CHF. 2. Slightly more confluent opacity at the RIGHT lung  base which could represent alveolar pulmonary edema related to the CHF or could represent a superimposed developing pneumonia. Electronically Signed   By: Franki Cabot M.D.   On: 06/21/2022 10:36    Microbiology: Results for orders placed or performed during the hospital encounter of 07/01/22  MRSA Next Gen by PCR, Nasal     Status: None   Collection Time: 07/02/22  1:41 AM   Specimen: Nasal Mucosa; Nasal Swab  Result Value Ref Range Status   MRSA by PCR Next Gen NOT DETECTED NOT DETECTED Final    Comment: (NOTE) The GeneXpert MRSA Assay (FDA approved for NASAL specimens only), is one component of a comprehensive MRSA colonization surveillance program. It is not intended to diagnose MRSA infection nor to guide or monitor treatment  for MRSA infections. Test performance is not FDA approved in patients less than 61 years old. Performed at [facility], [place] [address] [place],  [address]     Labs: CBC: Recent Labs  Lab 07/01/22 1845 07/02/22 0349 07/03/22 0619  WBC 8.1 7.5 6.3  NEUTROABS  --  2.4  --   HGB 13.8 13.3 14.5  HCT 41.3 41.0 44.3  MCV 90.4 92.8 91.9  PLT 373 346 [id number]   Basic Metabolic Panel: Recent Labs  Lab 07/01/22 1845 07/02/22 0349 07/03/22 0619  NA 140 141 139  K 3.3* 3.5 3.5  CL 104 108 107  CO2 25 28 26   GLUCOSE 173* 110* 114*  BUN 11 12 15   CREATININE 1.02 1.08 1.09  CALCIUM 9.2 8.7* 8.9  MG  --  2.6* 2.4  PHOS  --  3.4  --    Liver Function Tests: Recent Labs  Lab 07/02/22 0349  AST 32  ALT 25  ALKPHOS 78  BILITOT 0.6  PROT 7.1  ALBUMIN 3.6   CBG: No results for input(s): "GLUCAP" in the last 168 hours.  Discharge time spent: 42 minutes.   Signed: Hosie Poisson, MD [facility] 07/07/2022

## 2022-07-07 NOTE — Telephone Encounter (Signed)
Transition Care Management Follow-up Telephone Call Date of discharge and from where: 07/04/2022, [facility] How have you been since you were released from the hospital? He stated that he is feeling good.  Any questions or concerns? No  Items Reviewed: Did the pt receive and understand the discharge instructions provided? Yes  Medications obtained and verified? Yes - he said he has all of his medications and did not have any questions about the med regime  Other? No  Any new allergies since your discharge? No  Dietary orders reviewed? Yes Do you have support at home? Yes   Home Care and Equipment/Supplies: Were home health services ordered? no If so, what is the name of the agency? N/a  Has the agency set up a time to come to the patient's home? not applicable Were any new equipment or medical supplies ordered?  No What is the name of the medical supply agency? N/a Were you able to get the supplies/equipment? not applicable Do you have any questions related to the use of the equipment or supplies? No  Functional Questionnaire: (I = Independent and D = Dependent) ADLs: independent  Follow up appointments reviewed:  PCP Hospital f/u appt confirmed? Yes  Scheduled to see Dr Wynetta Emery - 07/21/2022   Specialist Hospital f/u appt confirmed? Yes  Scheduled to see cardiology - 07/09/2022. Are transportation arrangements needed? No  If their condition worsens, is the pt aware to call PCP or go to the Emergency Dept.? Yes Was the patient provided with contact information for the PCP's office or ED? Yes Was to pt encouraged to call back with questions or concerns? Yes

## 2022-07-08 NOTE — Progress Notes (Unsigned)
Cardiology Office Note   Date:  07/09/2022   ID:  Richard, Hale 01-01-1986, MRN [id number]  PCP:  [name], MD  Cardiologist:   Rollene Rotunda, MD Referring:  [name], MD    Chief Complaint  Patient presents with   Shortness of Breath       History of Present Illness: Richard Hale is a 36 y.o. male who presents for evaluation of chest pain.   He had an echo in June with an EF of 45%.   He was hospitalized in March 2020 with hypertensive urgency due to running out of his medication.  He was hospitalized again in June with hypertension and found to have a reduced EF.  He was last seen by Dr. Antoine Poche in clinic on 08/29/2019.  At that time he reported chest discomfort that was constant.  He reported having this chest discomfort in the hospital with a high-sensitivity troponin that was normal.  Chest pressure was felt to be atypical.    Since I last saw him he was in the hospital in last week with acute on chronic combined HF.   I reviewed these records for this visit.    EF was 20 - 25%.  He had uncontrolled HTN.  Trop elevation was thought to be related to demand ischemia.  Clonidine, HCTZ and ARB were stopped on discharge.   He was sent home on meds as below.     He says he has been breathing much better.  He thought that his symptoms came on pretty quickly.  He does not really swell in his legs or his belly when this happens.  He does not know his weights.  He has not been having any new chest pressure, neck or arm discomfort.  Has not been having any new PND or orthopnea.  He feels much better since he was diuresed 14 pounds.  I am not sure what his discharge weight was.  He was having shortness of breath with activity but he is now able to be back at work.  Past Medical History:  Diagnosis Date   CHF (congestive heart failure) (HCC)    Hypertension    Morbid obesity (HCC)    Obstructive sleep apnea    CPAP   Tobacco use 04/06/2019    Past  Surgical History:  Procedure Laterality Date   NOSE SURGERY       Current Outpatient Medications  Medication Sig Dispense Refill   amLODipine (NORVASC) 10 MG tablet Take 1 tablet (10 mg total) by mouth daily. 30 tablet 3   empagliflozin (JARDIANCE) 10 MG TABS tablet Take 1 tablet (10 mg total) by mouth daily. 30 tablet 3   furosemide (LASIX) 80 MG tablet Take 1 tablet (80 mg total) by mouth daily. 30 tablet 3   sacubitril-valsartan (ENTRESTO) 49-51 MG Take 1 tablet by mouth 2 (two) times daily. 60 tablet 3   spironolactone (ALDACTONE) 100 MG tablet Take 1 tablet (100 mg total) by mouth daily. 30 tablet 3   carvedilol (COREG) 12.5 MG tablet Take 1 tablet (12.5 mg total) by mouth 2 (two) times daily with a meal. 180 tablet 0   No current facility-administered medications for this visit.    Allergies:   Lisinopril    ROS:  Please see the history of present illness.   Otherwise, review of systems are positive for none.   All other systems are reviewed and negative.    PHYSICAL EXAM: VS:  BP (!) 150/88   Pulse 90   Ht 6' 2.5" (1.892 m)   Wt (!) 407 lb 12.8 oz (185 kg)   SpO2 97%   BMI 51.66 kg/m  , BMI Body mass index is 51.66 kg/m. GENERAL:  Well appearing NECK:  No jugular venous distention, waveform within normal limits, carotid upstroke brisk and symmetric, no bruits, no thyromegaly LUNGS:  Clear to auscultation bilaterally CHEST:  Unremarkable HEART:  PMI not displaced or sustained,S1 and S2 within normal limits, no S3, no S4, no clicks, no rubs, no murmurs ABD:  Flat, positive bowel sounds normal in frequency in pitch, no bruits, no rebound, no guarding, no midline pulsatile mass, no hepatomegaly, no splenomegaly EXT:  2 plus pulses throughout, no edema, no cyanosis no clubbing    EKG:  EKG is not ordered today.     Recent Labs: 07/02/2022: ALT 25; B Natriuretic Peptide 166.7; TSH 0.962 07/03/2022: BUN 15; Creatinine, Ser 1.09; Hemoglobin 14.5; Magnesium 2.4;  Platelets 378; Potassium 3.5; Sodium 139    Lipid Panel    Component Value Date/Time   CHOL 121 11/24/2021 1043   TRIG 83 11/24/2021 1043   HDL 54 11/24/2021 1043   CHOLHDL 2.2 11/24/2021 1043   CHOLHDL 3.1 04/07/2019 0856   VLDL 29 04/07/2019 0856   LDLCALC 51 11/24/2021 1043      Wt Readings from Last 3 Encounters:  07/09/22 (!) 407 lb 12.8 oz (185 kg)  07/04/22 (!) 397 lb 7.8 oz (180.3 kg)  01/01/22 (!) 406 lb 3.2 oz (184.3 kg)      Other studies Reviewed: Additional studies/ records that were reviewed today include: Hospital records Review of the above records demonstrates:  Please see elsewhere in the note.     ASSESSMENT AND PLAN:   ACUTE ON CHRONIC SYSTOLIC HF:   EF is newly lower than previous.  He has been medically noncompliant before with uncontrolled hypertension which has been the presumed etiology.  He has significant LVH.  He is scheduled to get an MRI.  I think this is reasonable to proceed although I have not strongly suspect an infiltrative etiology.   Might eventually consider SPECT stress testing.   Today I am going to increase his carvedilol to 12.5 mg twice daily.  At the next visit he should have his Entresto increased.  I will follow-up with echocardiography in the future.   MORBID OBESITY: We will continue to work on this and he has been educated about carbohydrates.   TOBACCO ABUSE :   We talked about the need to stop smoking completely.  HTN:   This is being managed in the context of treating his CHF  SLEEP APNEA: He is unfortunately unable to use CPAP.     Current medicines are reviewed at length with the patient today.  The patient does not have concerns regarding medicines.  The following changes have been made: As above  Labs/ tests ordered today include: None  Orders Placed This Encounter  Procedures   Basic metabolic panel    Disposition:   FU with Coletta Memos NP in 2 weeks   Signed, Minus Breeding, MD  07/09/2022 12:33 PM     [place]

## 2022-07-09 ENCOUNTER — Encounter: Payer: Self-pay | Admitting: Cardiology

## 2022-07-09 ENCOUNTER — Ambulatory Visit: Payer: BC Managed Care – PPO | Attending: Cardiology | Admitting: Cardiology

## 2022-07-09 VITALS — BP 150/88 | HR 90 | Ht 74.5 in | Wt >= 6400 oz

## 2022-07-09 DIAGNOSIS — I5023 Acute on chronic systolic (congestive) heart failure: Secondary | ICD-10-CM | POA: Diagnosis not present

## 2022-07-09 DIAGNOSIS — I160 Hypertensive urgency: Secondary | ICD-10-CM | POA: Diagnosis not present

## 2022-07-09 MED ORDER — CARVEDILOL 12.5 MG PO TABS: 12.5000 mg | ORAL_TABLET | Freq: Two times a day (BID) | ORAL | 0 refills | Status: DC

## 2022-07-09 NOTE — Patient Instructions (Signed)
Medication Instructions:  INCREASE carvedilol to 12.5mg  twice daily  *If you need a refill on your cardiac medications before your next appointment, please call your pharmacy*   Lab Work: Non-Fasting BMET in 2 weeks  If you have labs (blood work) drawn today and your tests are completely normal, you will receive your results only by: [place] (if you have MyChart) OR A paper copy in the mail If you have any lab test that is abnormal or we need to change your treatment, we will call you to review the results.   Follow-Up: At [facility], you and your health needs are our priority.  As part of our continuing mission to provide you with exceptional heart care, we have created designated Provider Care Teams.  These Care Teams include your primary Cardiologist (physician) and Advanced Practice Providers (APPs -  Physician Assistants and Nurse Practitioners) who all work together to provide you with the care you need, when you need it.  We recommend signing up for the patient portal called "MyChart".  Sign up information is provided on this After Visit Summary.  MyChart is used to connect with patients for Virtual Visits (Telemedicine).  Patients are able to view lab/test results, encounter notes, upcoming appointments, etc.  Non-urgent messages can be sent to your provider as well.   To learn more about what you can do with MyChart, go to [email or web address].    Your next appointment:   2 weeks with Denyse Amass NP or Dr. Percival Spanish

## 2022-07-21 ENCOUNTER — Encounter: Payer: Self-pay | Admitting: Internal Medicine

## 2022-07-21 ENCOUNTER — Ambulatory Visit: Payer: BC Managed Care – PPO | Attending: Internal Medicine | Admitting: Internal Medicine

## 2022-07-21 VITALS — BP 126/84 | HR 83 | Ht 74.0 in | Wt >= 6400 oz

## 2022-07-21 DIAGNOSIS — F17200 Nicotine dependence, unspecified, uncomplicated: Secondary | ICD-10-CM

## 2022-07-21 DIAGNOSIS — Z2821 Immunization not carried out because of patient refusal: Secondary | ICD-10-CM

## 2022-07-21 DIAGNOSIS — I5022 Chronic systolic (congestive) heart failure: Secondary | ICD-10-CM | POA: Diagnosis not present

## 2022-07-21 DIAGNOSIS — G4733 Obstructive sleep apnea (adult) (pediatric): Secondary | ICD-10-CM

## 2022-07-21 DIAGNOSIS — Z09 Encounter for follow-up examination after completed treatment for conditions other than malignant neoplasm: Secondary | ICD-10-CM | POA: Diagnosis not present

## 2022-07-21 DIAGNOSIS — Z6841 Body Mass Index (BMI) 40.0 and over, adult: Secondary | ICD-10-CM

## 2022-07-21 DIAGNOSIS — L309 Dermatitis, unspecified: Secondary | ICD-10-CM

## 2022-07-21 DIAGNOSIS — I10 Essential (primary) hypertension: Secondary | ICD-10-CM | POA: Diagnosis not present

## 2022-07-21 MED ORDER — TRIAMCINOLONE ACETONIDE 0.1 % EX CREA: 1.0000 | TOPICAL_CREAM | Freq: Two times a day (BID) | CUTANEOUS | 0 refills | Status: DC

## 2022-07-21 NOTE — Patient Instructions (Signed)
Try decreasing the pressure on your CPAP machine by 1. Try to limit the salt in the foods is much as possible. I encouraged her to try to stop smoking.

## 2022-07-21 NOTE — Progress Notes (Signed)
Patient ID: Richard Hale, male    DOB: Oct 22, 1985  MRN: [id number]  CC: Hospitalization Follow-up   Subjective: Richard Hale is a 36 y.o. male who presents for hospital follow-up.  Wife, Archie Patten, is with him. His concerns today include:  Patient with history of HTN, preDM, cardiomyopathy (EF 45% in 2020, 20-25% 06/2022), OSA on CPAP, morbid obesity, tob dep  Patient hospitalized 9/20-23/2023 with acute on chronic systolic CHF.  Echo revealed EF 20 to 25%, global hypokinesis of the left ventricle, severe concentric LVH.  Patient diuresed with weight down to 390 pounds.  Continued on Entresto, spironolactone and Jardiance. He saw Dr. Antoine Poche 07/09/2022 post hospital discharge.  Weight at that time was 407 LB.  Blood pressure was elevated.  Carvedilol increased to 12.5 mg twice a day.  Today: HTN/systolic CHF: Denies SOB, chest pains, palpitations. Checks BP several times a week.  Gives range of 130s/upper 80s. Limits salt in foods. Weighs himself regularly.  Weight is down 7 pounds since he saw the cardiologist the end of last month.  He is morbidly obese for height. Reports compliance with taking his medications including amlodipine 10 mg, spironolactone 100 mg, furosemide 80 mg, Jardiance 10 mg daily and carvedilol 12.5 mg twice a day and Entresto 49/51 mg twice a day.   OSA: On last visit with me he was not using the CPAP consistently stating that he falls asleep watching TV and does not have the mask on.  I advised that when he lays down to watch TV, he puts his CPAP on. -Still not using CPAP consistently.  Hard to sleep with it on because he feels the pressure is too much and makes him feel like he is being choked.  Tobacco dependence: He has cut back to 5 to 6 cigarettes a day.  I had prescribed Chantix for him on his last visit with me.  He states that he has the Chantix but has not started using it as yet.  Complains of itchy rash in the antecubital fossa times a few months.   No initiating factors.  He does not use cologne in this area.  HM: He declines flu shot.     Patient Active Problem List   Diagnosis Date Noted   Elevated troponin 07/02/2022   Acute on chronic systolic CHF (congestive heart failure) (HCC) 07/01/2022   Influenza vaccine refused 11/21/2020   COVID-19 vaccination declined 11/21/2020   CHF (congestive heart failure), NYHA class II, chronic, systolic (HCC) 11/21/2020   Tobacco dependence 11/21/2020   Cardiomyopathy (HCC) 08/28/2019   Chest pain of uncertain etiology 08/28/2019   Acute coronary syndrome (HCC) 04/06/2019   Tobacco abuse 04/06/2019   Hypokalemia 04/06/2019   Hypertensive urgency 12/14/2018   Essential hypertension 09/05/2018   Obstructive sleep apnea 09/05/2018   Obesity, Class III, BMI 40-49.9 (morbid obesity) (HCC) 09/05/2018   Prediabetes 09/05/2018     Current Outpatient Medications on File Prior to Visit  Medication Sig Dispense Refill   amLODipine (NORVASC) 10 MG tablet Take 1 tablet (10 mg total) by mouth daily. 30 tablet 3   carvedilol (COREG) 12.5 MG tablet Take 1 tablet (12.5 mg total) by mouth 2 (two) times daily with a meal. 180 tablet 0   empagliflozin (JARDIANCE) 10 MG TABS tablet Take 1 tablet (10 mg total) by mouth daily. 30 tablet 3   furosemide (LASIX) 80 MG tablet Take 1 tablet (80 mg total) by mouth daily. 30 tablet 3   sacubitril-valsartan (ENTRESTO) 49-51 MG Take  1 tablet by mouth 2 (two) times daily. 60 tablet 3   spironolactone (ALDACTONE) 100 MG tablet Take 1 tablet (100 mg total) by mouth daily. 30 tablet 3   No current facility-administered medications on file prior to visit.    Allergies  Allergen Reactions   Lisinopril Cough    Social History   Socioeconomic History   Marital status: Married    Spouse name: Not on file   Number of children: Not on file   Years of education: Not on file   Highest education level: Not on file  Occupational History   Not on file  Tobacco Use    Smoking status: Every Day    Packs/day: 0.25    Types: Cigarettes   Smokeless tobacco: Never   Tobacco comments:    5 cigs a day  Vaping Use   Vaping Use: Never used  Substance and Sexual Activity   Alcohol use: Yes    Comment: occ   Drug use: Never   Sexual activity: Yes  Other Topics Concern   Not on file  Social History Narrative   Lives with girlfriend and her kids.  He has three children.  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     Social Determinants of Health   Financial Resource Strain: Not on file  Food Insecurity: No Food Insecurity (07/02/2022)   Hunger Vital Sign    Worried About Running Out of Food in the Last Year: Never true    Ran Out of Food in the Last Year: Never true  Transportation Needs: No Transportation Needs (07/02/2022)   PRAPARE - Hydrologist (Medical): No    Lack of Transportation (Non-Medical): No  Physical Activity: Not on file  Stress: Not on file  Social Connections: Not on file  Intimate Partner Violence: Not At Risk (07/02/2022)   Humiliation, Afraid, Rape, and Kick questionnaire    Fear of Current or Ex-Partner: No    Emotionally Abused: No    Physically Abused: No    Sexually Abused: No    Family History  Problem Relation Age of Onset   Hypertension Mother    Heart failure Mother    Diabetes Maternal Grandmother    CAD Father 24       Heart attack and Stroke    Past Surgical History:  Procedure Laterality Date   NOSE SURGERY      ROS: Review of Systems Negative except as stated above  PHYSICAL EXAM: BP 126/84   Pulse 83   Ht 6\' 2"  (1.88 m)   Wt (!) 400 lb 3.2 oz (181.5 kg)   SpO2 99%   BMI 51.38 kg/m   Wt Readings from Last 3 Encounters:  07/21/22 (!) 400 lb 3.2 oz (181.5 kg)  07/09/22 (!) 407 lb 12.8 oz (185 kg)  07/04/22 (!) 397 lb 7.8 oz (180.3 kg)    Physical Exam  General appearance - alert, well appearing, and in no distress Mental status - normal mood, behavior, speech, dress, motor  activity, and thought processes Neck - supple, no significant adenopathy Chest - clear to auscultation, no wheezes, rales or rhonchi, symmetric air entry Heart - normal rate, regular rhythm, normal S1, S2, no murmurs, rubs, clicks or gallops Extremities - peripheral pulses normal, no pedal edema, no clubbing or cyanosis Skin -atopic scaly like rash below the antecubital fossa bilaterally.      Latest Ref Rng & Units 07/03/2022    6:19 AM 07/02/2022    3:49  AM 07/01/2022    6:45 PM  CMP  Glucose 70 - 99 mg/dL 114  110  173   BUN 6 - 20 mg/dL 15  12  11    Creatinine 0.61 - 1.24 mg/dL 1.09  1.08  1.02   Sodium 135 - 145 mmol/L 139  141  140   Potassium 3.5 - 5.1 mmol/L 3.5  3.5  3.3   Chloride 98 - 111 mmol/L 107  108  104   CO2 22 - 32 mmol/L 26  28  25    Calcium 8.9 - 10.3 mg/dL 8.9  8.7  9.2   Total Protein 6.5 - 8.1 g/dL  7.1    Total Bilirubin 0.3 - 1.2 mg/dL  0.6    Alkaline Phos 38 - 126 U/L  78    AST 15 - 41 U/L  32    ALT 0 - 44 U/L  25     Lipid Panel     Component Value Date/Time   CHOL 121 11/24/2021 1043   TRIG 83 11/24/2021 1043   HDL 54 11/24/2021 1043   CHOLHDL 2.2 11/24/2021 1043   CHOLHDL 3.1 04/07/2019 0856   VLDL 29 04/07/2019 0856   LDLCALC 51 11/24/2021 1043    CBC    Component Value Date/Time   WBC 6.3 07/03/2022 0619   RBC 4.82 07/03/2022 0619   HGB 14.5 07/03/2022 0619   HGB 15.4 11/24/2021 1043   HCT 44.3 07/03/2022 0619   HCT 45.1 11/24/2021 1043   PLT 378 07/03/2022 0619   PLT 388 11/24/2021 1043   MCV 91.9 07/03/2022 0619   MCV 86 11/24/2021 1043   MCH 30.1 07/03/2022 0619   MCHC 32.7 07/03/2022 0619   RDW 13.6 07/03/2022 0619   RDW 12.3 11/24/2021 1043   LYMPHSABS 3.7 07/02/2022 0349   LYMPHSABS 2.8 04/23/2017 1143   MONOABS 0.6 07/02/2022 0349   EOSABS 0.7 (H) 07/02/2022 0349   EOSABS 0.3 04/23/2017 1143   BASOSABS 0.0 07/02/2022 0349   BASOSABS 0.0 04/23/2017 1143    ASSESSMENT AND PLAN: 1. Hospital discharge  follow-up   2. CHF (congestive heart failure), NYHA class II, chronic, systolic (HCC) Compensated.  Weight is down 7 pounds over the past 10 days. Continue carvedilol, furosemide, spironolactone, Entresto and Jardiance.  Continue to limit salt in the foods.  3. Essential hypertension Not at goal but improved.  He will continue current medications listed above including amlodipine  4. OSA (obstructive sleep apnea) Recommend decreasing the pressure on his CPAP by 1-2 units to see if he tolerates it better..  Patient states he knows how to decrease the pressure on his device and will try doing that  5. Tobacco dependence Advised to quit.  He is aware of health risks associated with smoking.  6. Dermatitis - triamcinolone cream (KENALOG) 0.1 %; Apply 1 Application topically 2 (two) times daily.  Dispense: 30 g; Refill: 0  7. Morbid obesity with BMI of 50.0-59.9, adult ([place]) Patient advised to eliminate sugary drinks from the diet, cut back on portion sizes especially of [place] carbohydrates, eat more [place] lean meat like chicken [place] and seafood instead of beef or pork and incorporate fresh fruits and vegetables into the diet daily. Encouraged him to move as much as he can  8. Influenza vaccination declined Recommended.  Patient declined.   Patient was given the opportunity to ask questions.  Patient verbalized understanding of the plan and was able to repeat key elements of the plan.   This  documentation was completed using Radio producer.  Any transcriptional errors are unintentional.  No orders of the defined types were placed in this encounter.    Requested Prescriptions   Signed Prescriptions Disp Refills   triamcinolone cream (KENALOG) 0.1 % 30 g 0    Sig: Apply 1 Application topically 2 (two) times daily.    Return in about 4 months (around 11/21/2022).  Karle Plumber, MD, FACP

## 2022-07-22 NOTE — Progress Notes (Signed)
Cardiology Office Note   Date:  07/23/2022   ID:  Richard Hale 01/25/1986, MRN [id number]  PCP:  [name], MD  Cardiologist:   Rollene Rotunda, MD Referring:  [name], MD    Chief Complaint  Patient presents with   Cardiomyopathy       History of Present Illness: Richard Hale is a 36 y.o. male who presents for evaluation of chest pain.   He had an echo in June with an EF of 45%.   He was hospitalized in March 2020 with hypertensive urgency due to running out of his medication.  EF was 20 - 25%.  He had uncontrolled HTN.  Trop elevation was thought to be related to demand ischemia.  Clonidine, HCTZ and ARB were stopped on discharge.   He was sent home on meds as below.   After the last visit MRI was ordered and is to be done in December.  At the last visit I increased his beta-blocker.  He has done well with this.  He denies any new cardiovascular symptoms.  Has had no chest pressure, neck or arm discomfort.  He is having no new shortness of breath, PND or orthopnea.  He has had no weight gain or edema but unfortunately he has had no weight loss.   Past Medical History:  Diagnosis Date   CHF (congestive heart failure) (HCC)    Hypertension    Morbid obesity (HCC)    Obstructive sleep apnea    CPAP   Tobacco use 04/06/2019    Past Surgical History:  Procedure Laterality Date   NOSE SURGERY       Current Outpatient Medications  Medication Sig Dispense Refill   amLODipine (NORVASC) 10 MG tablet Take 1 tablet (10 mg total) by mouth daily. 30 tablet 3   carvedilol (COREG) 12.5 MG tablet Take 1 tablet (12.5 mg total) by mouth 2 (two) times daily with a meal. 180 tablet 0   empagliflozin (JARDIANCE) 10 MG TABS tablet Take 1 tablet (10 mg total) by mouth daily. 30 tablet 3   furosemide (LASIX) 80 MG tablet Take 1 tablet (80 mg total) by mouth daily. 30 tablet 3   sacubitril-valsartan (ENTRESTO) 97-103 MG Take 1 tablet by mouth 2 (two)  times daily. 180 tablet 3   spironolactone (ALDACTONE) 100 MG tablet Take 1 tablet (100 mg total) by mouth daily. 30 tablet 3   triamcinolone cream (KENALOG) 0.1 % Apply 1 Application topically 2 (two) times daily. 30 g 0   No current facility-administered medications for this visit.    Allergies:   Lisinopril    ROS:  Please see the history of present illness.   Otherwise, review of systems are positive for none.   All other systems are reviewed and negative.    PHYSICAL EXAM: VS:  BP (!) 146/92   Pulse 79   Ht 6' 2.5" (1.892 m)   Wt (!) 399 lb (181 kg)   SpO2 99%   BMI 50.54 kg/m  , BMI Body mass index is 50.54 kg/m. GENERAL:  Well appearing NECK:  No jugular venous distention, waveform within normal limits, carotid upstroke brisk and symmetric, no bruits, no thyromegaly LUNGS:  Clear to auscultation bilaterally CHEST:  Unremarkable HEART:  PMI not displaced or sustained,S1 and S2 within normal limits, no S3, no S4, no clicks, no rubs, no murmurs ABD:  Flat, positive bowel sounds normal in frequency in pitch, no bruits, no rebound,  no guarding, no midline pulsatile mass, no hepatomegaly, no splenomegaly EXT:  2 plus pulses throughout, no edema, no cyanosis no clubbing   EKG:  EKG is not ordered today. NA    Recent Labs: 07/02/2022: ALT 25; B Natriuretic Peptide 166.7; TSH 0.962 07/03/2022: BUN 15; Creatinine, Ser 1.09; Hemoglobin 14.5; Magnesium 2.4; Platelets 378; Potassium 3.5; Sodium 139    Lipid Panel    Component Value Date/Time   CHOL 121 11/24/2021 1043   TRIG 83 11/24/2021 1043   HDL 54 11/24/2021 1043   CHOLHDL 2.2 11/24/2021 1043   CHOLHDL 3.1 04/07/2019 0856   VLDL 29 04/07/2019 0856   LDLCALC 51 11/24/2021 1043      Wt Readings from Last 3 Encounters:  07/23/22 (!) 399 lb (181 kg)  07/21/22 (!) 400 lb 3.2 oz (181.5 kg)  07/09/22 (!) 407 lb 12.8 oz (185 kg)      Other studies Reviewed: Additional studies/ records that were reviewed today  include:  None Review of the above records demonstrates:  NA   ASSESSMENT AND PLAN:   ACUTE ON CHRONIC SYSTOLIC HF:   Today I am going to increase his Entresto to 97/103 twice daily.  He is going to get his MRI as above to make sure he has no infiltrative disease though I think this is related to hypertension.  Might eventually consider PET scanning though I do not strongly suspect an ischemic etiology.   If we can further titrate his beta-blocker at the next visit I will wait then till he is on maximal therapy and repeat an echo at some point.   MORBID OBESITY:   We talked again about weight loss with carbohydrate restriction.  TOBACCO ABUSE :   We talked again today about the need to stop smoking.  HTN:   This is being managed in the context of treating his CHF  SLEEP APNEA:   He unfortunately sleeps on his stomach.  I will try to get him to give me the name of his sleep doctor and we can reconsult them to see if there is something else that can be done.    Current medicines are reviewed at length with the patient today.  The patient does not have concerns regarding medicines.  The following changes have been made: As above  Labs/ tests ordered today include: None  No orders of the defined types were placed in this encounter.   Disposition:   FU with APP in 3 months.   Signed, Minus Breeding, MD  07/23/2022 9:15 AM    [place]

## 2022-07-23 ENCOUNTER — Encounter: Payer: Self-pay | Admitting: Cardiology

## 2022-07-23 ENCOUNTER — Ambulatory Visit: Payer: BC Managed Care – PPO | Attending: Cardiology | Admitting: Cardiology

## 2022-07-23 VITALS — BP 146/92 | HR 79 | Ht 74.5 in | Wt 399.0 lb

## 2022-07-23 DIAGNOSIS — I10 Essential (primary) hypertension: Secondary | ICD-10-CM | POA: Diagnosis not present

## 2022-07-23 DIAGNOSIS — I5023 Acute on chronic systolic (congestive) heart failure: Secondary | ICD-10-CM

## 2022-07-23 DIAGNOSIS — Z720 Tobacco use: Secondary | ICD-10-CM

## 2022-07-23 DIAGNOSIS — G4730 Sleep apnea, unspecified: Secondary | ICD-10-CM

## 2022-07-23 MED ORDER — SACUBITRIL-VALSARTAN 97-103 MG PO TABS: 1.0000 | ORAL_TABLET | Freq: Two times a day (BID) | ORAL | 3 refills | Status: DC

## 2022-07-23 NOTE — Patient Instructions (Signed)
Medication Instructions:  Your physician has recommended you make the following change in your medication:  INCREASE: Entresto 97-103 Twice daily *If you need a refill on your cardiac medications before your next appointment, please call your pharmacy*   Lab Work: NONE ordered at this time of appointment  If you have labs (blood work) drawn today and your tests are completely normal, you will receive your results only by: [place] (if you have MyChart) OR A paper copy in the mail If you have any lab test that is abnormal or we need to change your treatment, we will call you to review the results.   Testing/Procedures: NONE ordered at this time of appointment   Follow-Up: At [facility], you and your health needs are our priority.  As part of our continuing mission to provide you with exceptional heart care, we have created designated Provider Care Teams.  These Care Teams include your primary Cardiologist (physician) and Advanced Practice Providers (APPs -  Physician Assistants and Nurse Practitioners) who all work together to provide you with the care you need, when you need it.  We recommend signing up for the patient portal called "MyChart".  Sign up information is provided on this After Visit Summary.  MyChart is used to connect with patients for Virtual Visits (Telemedicine).  Patients are able to view lab/test results, encounter notes, upcoming appointments, etc.  Non-urgent messages can be sent to your provider as well.   To learn more about what you can do with MyChart, go to [email or web address].    Your next appointment:   3 month(s)  The format for your next appointment:   In Person  Provider:   Diona Browner, NP       Important Information About Sugar

## 2022-07-24 ENCOUNTER — Telehealth: Payer: Self-pay

## 2022-07-24 NOTE — Telephone Encounter (Signed)
Called an spoke with the patient in regards to him needing a work note from his appointment with Dr. Percival Spanish on 07/23/2022. When I spoke with the patient he stated that he no longer needed a note for work, I assured him that we could write the note and back date it to yesterday. But he made it clear that he didn't need it. Still advised him to give [place] a call back if something came up and he needed it, he gave a verbal understanding.

## 2022-07-24 NOTE — Telephone Encounter (Signed)
Spoke with the patient he stated that he no longer needed the work note.I advised him if anything came up and he needed it to give [place] a call. He gave a verbal understanding.

## 2022-08-06 ENCOUNTER — Encounter: Payer: Self-pay | Admitting: Cardiology

## 2022-08-06 DIAGNOSIS — I10 Essential (primary) hypertension: Secondary | ICD-10-CM

## 2022-08-07 MED ORDER — SACUBITRIL-VALSARTAN 97-103 MG PO TABS: 1.0000 | ORAL_TABLET | Freq: Two times a day (BID) | ORAL | 0 refills | Status: DC

## 2022-08-07 MED ORDER — AMLODIPINE BESYLATE 10 MG PO TABS: 10.0000 mg | ORAL_TABLET | Freq: Every day | ORAL | 3 refills | Status: DC

## 2022-08-07 MED ORDER — EMPAGLIFLOZIN 10 MG PO TABS: 10.0000 mg | ORAL_TABLET | Freq: Every day | ORAL | 3 refills | Status: DC

## 2022-08-07 MED ORDER — CARVEDILOL 12.5 MG PO TABS: 12.5000 mg | ORAL_TABLET | Freq: Two times a day (BID) | ORAL | 0 refills | Status: DC

## 2022-08-07 MED ORDER — SPIRONOLACTONE 100 MG PO TABS: 100.0000 mg | ORAL_TABLET | Freq: Every day | ORAL | 3 refills | Status: DC

## 2022-08-07 MED ORDER — FUROSEMIDE 80 MG PO TABS: 80.0000 mg | ORAL_TABLET | Freq: Every day | ORAL | 3 refills | Status: DC

## 2022-08-14 ENCOUNTER — Encounter: Payer: Self-pay | Admitting: Internal Medicine

## 2022-08-16 ENCOUNTER — Other Ambulatory Visit: Payer: Self-pay | Admitting: Internal Medicine

## 2022-08-16 DIAGNOSIS — I10 Essential (primary) hypertension: Secondary | ICD-10-CM

## 2022-08-16 MED ORDER — EMPAGLIFLOZIN 10 MG PO TABS: 10.0000 mg | ORAL_TABLET | Freq: Every day | ORAL | 6 refills | Status: DC

## 2022-08-16 MED ORDER — CARVEDILOL 12.5 MG PO TABS: 12.5000 mg | ORAL_TABLET | Freq: Two times a day (BID) | ORAL | 1 refills | Status: DC

## 2022-08-16 MED ORDER — SACUBITRIL-VALSARTAN 97-103 MG PO TABS: 1.0000 | ORAL_TABLET | Freq: Two times a day (BID) | ORAL | 1 refills | Status: DC

## 2022-08-16 MED ORDER — SPIRONOLACTONE 100 MG PO TABS: 100.0000 mg | ORAL_TABLET | Freq: Every day | ORAL | 1 refills | Status: DC

## 2022-08-16 MED ORDER — FUROSEMIDE 80 MG PO TABS: 80.0000 mg | ORAL_TABLET | Freq: Every day | ORAL | 6 refills | Status: DC

## 2022-08-16 MED ORDER — AMLODIPINE BESYLATE 10 MG PO TABS: 10.0000 mg | ORAL_TABLET | Freq: Every day | ORAL | 6 refills | Status: DC

## 2022-09-28 ENCOUNTER — Telehealth (HOSPITAL_COMMUNITY): Payer: Self-pay | Admitting: Emergency Medicine

## 2022-09-28 NOTE — Telephone Encounter (Signed)
Attempted to call patient regarding upcoming cardiac MR appointment. Left message on voicemail with name and callback number Shawnette Augello RN Navigator Cardiac Imaging [facility] [phone] Office [phone] Cell  

## 2022-09-30 ENCOUNTER — Ambulatory Visit (HOSPITAL_COMMUNITY): Admission: RE | Admit: 2022-09-30 | Payer: BC Managed Care – PPO | Source: Ambulatory Visit

## 2022-10-23 ENCOUNTER — Ambulatory Visit: Payer: BC Managed Care – PPO | Attending: Nurse Practitioner | Admitting: Nurse Practitioner

## 2022-10-23 NOTE — Progress Notes (Deleted)
Office Visit    Patient Name: Richard Hale Date of Encounter: 10/23/2022  Primary Care Provider:  Ladell Pier, MD Primary Cardiologist:  Minus Breeding, MD  Chief Complaint    37 year old male with a history of cardiomyopathy, chronic systolic heart failure, hypertension, OSA, obesity, and tobacco use who presents for follow-up related to heart failure.  Past Medical History    Past Medical History:  Diagnosis Date   CHF (congestive heart failure) ([place])    Hypertension    Morbid obesity ([place])    Obstructive sleep apnea    CPAP   Tobacco use 04/06/2019   Past Surgical History:  Procedure Laterality Date   NOSE SURGERY      Allergies  Allergies  Allergen Reactions   Lisinopril Cough     Labs/Other Studies Reviewed    The following studies were reviewed today: Echo 07/02/2022: IMPRESSIONS  1. Left ventricular ejection fraction, by estimation, is 20 to 25%. The left ventricle has severely decreased function. The left ventricle demonstrates global hypokinesis. The left ventricular internal cavity size was moderately dilated. There is severe  concentric left ventricular hypertrophy. Indeterminate diastolic filling due to E-A fusion.  2. Right ventricular systolic function is normal. The right ventricular size is normal. Tricuspid regurgitation signal is inadequate for assessing PA pressure.  3. A small pericardial effusion is present. The pericardial effusion is circumferential.  4. The mitral valve is grossly normal. No evidence of mitral valve regurgitation. No evidence of mitral stenosis.  5. The aortic valve is tricuspid. Aortic valve regurgitation is not visualized. No aortic stenosis is present.  6. The inferior vena cava is normal in size with greater than 50% respiratory variability, suggesting right atrial pressure of 3 mmHg.  Comparison(s): Changes from prior study are noted. The left ventricular function is significantly worse. Recent  Labs: 07/02/2022: ALT 25; B Natriuretic Peptide 166.7; TSH 0.962 07/03/2022: BUN 15; Creatinine, Ser 1.09; Hemoglobin 14.5; Magnesium 2.4; Platelets 378; Potassium 3.5; Sodium 139  Recent Lipid Panel    Component Value Date/Time   CHOL 121 11/24/2021 1043   TRIG 83 11/24/2021 1043   HDL 54 11/24/2021 1043   CHOLHDL 2.2 11/24/2021 1043   CHOLHDL 3.1 04/07/2019 0856   VLDL 29 04/07/2019 0856   LDLCALC 51 11/24/2021 1043    History of Present Illness    37 year old male with the above past medical history including cardiomyopathy, chronic systolic heart failure, hypertension, OSA, obesity, and tobacco use.  Prior echocardiogram in June 2020 showed EF 45%, moderately increased LV wall thickness, LV global hypokinesis without RWMA, normal RV systolic function, no significant valvular abnormalities.  He was hospitalized in 06/2022 with failure of acute on chronic systolic heart failure.  Repeat echocardiogram in 06/2022 showed EF 20 to 25%, severely decreased LV function, severe concentric LVH, normal RV function, small circumferential pericardial effusion, no significant valvular abnormalities. It was thought that his cardiomyopathy was likely related to uncontrolled hypertension, however, outpatient cardiac MRI was recommended for infiltrative disease.  Additionally, there was mention of possible cardiac PET stress test in future to rule out ischemic source.  GDMT was optimized to include Entresto, spironolactone, Jardiance, carvedilol, and Lasix.  He was last seen in the office on 07/23/2022 and was stable from a cardiac standpoint.  Delene Loll was increased to 97/103 twice daily.  He was advised to proceed with cardiac MRI.  He presents today for follow-up.  Since his last visit  Cardiomyopathy/chronic systolic heart failure: Hypertension: Obesity: OSA: Tobacco use: Disposition:  Home Medications    Current Outpatient Medications  Medication Sig Dispense Refill   amLODipine (NORVASC) 10 MG  tablet Take 1 tablet (10 mg total) by mouth daily. 30 tablet 6   carvedilol (COREG) 12.5 MG tablet Take 1 tablet (12.5 mg total) by mouth 2 (two) times daily with a meal. 180 tablet 1   empagliflozin (JARDIANCE) 10 MG TABS tablet Take 1 tablet (10 mg total) by mouth daily. 30 tablet 6   furosemide (LASIX) 80 MG tablet Take 1 tablet (80 mg total) by mouth daily. 30 tablet 6   sacubitril-valsartan (ENTRESTO) 97-103 MG Take 1 tablet by mouth 2 (two) times daily. 180 tablet 1   spironolactone (ALDACTONE) 100 MG tablet Take 1 tablet (100 mg total) by mouth daily. 90 tablet 1   triamcinolone cream (KENALOG) 0.1 % Apply 1 Application topically 2 (two) times daily. 30 g 0   No current facility-administered medications for this visit.     Review of Systems    ***.  All other systems reviewed and are otherwise negative except as noted above.    Physical Exam    VS:  There were no vitals taken for this visit. , BMI There is no height or weight on file to calculate BMI.     GEN: Well nourished, well developed, in no acute distress. HEENT: normal. Neck: Supple, no JVD, carotid bruits, or masses. Cardiac: RRR, no murmurs, rubs, or gallops. No clubbing, cyanosis, edema.  Radials/DP/PT 2+ and equal bilaterally.  Respiratory:  Respirations regular and unlabored, clear to auscultation bilaterally. GI: Soft, nontender, nondistended, BS + x 4. MS: no deformity or atrophy. Skin: warm and dry, no rash. Neuro:  Strength and sensation are intact. Psych: Normal affect.  Accessory Clinical Findings    ECG personally reviewed by me today - *** - no acute changes.   Lab Results  Component Value Date   WBC 6.3 07/03/2022   HGB 14.5 07/03/2022   HCT 44.3 07/03/2022   MCV 91.9 07/03/2022   PLT 378 07/03/2022   Lab Results  Component Value Date   CREATININE 1.09 07/03/2022   BUN 15 07/03/2022   NA 139 07/03/2022   K 3.5 07/03/2022   CL 107 07/03/2022   CO2 26 07/03/2022   Lab Results  Component  Value Date   ALT 25 07/02/2022   AST 32 07/02/2022   ALKPHOS 78 07/02/2022   BILITOT 0.6 07/02/2022   Lab Results  Component Value Date   CHOL 121 11/24/2021   HDL 54 11/24/2021   LDLCALC 51 11/24/2021   TRIG 83 11/24/2021   CHOLHDL 2.2 11/24/2021    Lab Results  Component Value Date   HGBA1C 6.1 (H) 11/21/2020    Assessment & Plan    1.  ***  No BP recorded.  {Refresh Note OR Click here to enter BP  :1}***   Lenna Sciara, NP 10/23/2022, 5:06 AM

## 2022-11-23 ENCOUNTER — Ambulatory Visit: Payer: BC Managed Care – PPO | Admitting: Internal Medicine

## 2023-04-14 ENCOUNTER — Ambulatory Visit: Payer: BC Managed Care – PPO | Admitting: Physician Assistant

## 2023-04-14 ENCOUNTER — Encounter: Payer: Self-pay | Admitting: Physician Assistant

## 2023-04-14 VITALS — BP 178/116 | HR 78 | Ht 74.0 in | Wt 398.0 lb

## 2023-04-14 DIAGNOSIS — I5022 Chronic systolic (congestive) heart failure: Secondary | ICD-10-CM | POA: Diagnosis not present

## 2023-04-14 DIAGNOSIS — I160 Hypertensive urgency: Secondary | ICD-10-CM

## 2023-04-14 DIAGNOSIS — I420 Dilated cardiomyopathy: Secondary | ICD-10-CM

## 2023-04-14 DIAGNOSIS — I10 Essential (primary) hypertension: Secondary | ICD-10-CM | POA: Diagnosis not present

## 2023-04-14 DIAGNOSIS — Z7984 Long term (current) use of oral hypoglycemic drugs: Secondary | ICD-10-CM | POA: Diagnosis not present

## 2023-04-14 DIAGNOSIS — R7303 Prediabetes: Secondary | ICD-10-CM | POA: Diagnosis not present

## 2023-04-14 DIAGNOSIS — F17210 Nicotine dependence, cigarettes, uncomplicated: Secondary | ICD-10-CM

## 2023-04-14 DIAGNOSIS — Z6841 Body Mass Index (BMI) 40.0 and over, adult: Secondary | ICD-10-CM

## 2023-04-14 LAB — POCT GLYCOSYLATED HEMOGLOBIN (HGB A1C): Hemoglobin A1C: 5.9 % — AB (ref 4.0–5.6)

## 2023-04-14 MED ORDER — EMPAGLIFLOZIN 10 MG PO TABS: 10.0000 mg | ORAL_TABLET | Freq: Every day | ORAL | 0 refills | Status: DC

## 2023-04-14 MED ORDER — SPIRONOLACTONE 100 MG PO TABS: 100.0000 mg | ORAL_TABLET | Freq: Every day | ORAL | 0 refills | Status: DC

## 2023-04-14 MED ORDER — CLONIDINE HCL 0.1 MG PO TABS
0.2000 mg | ORAL_TABLET | Freq: Once | ORAL | Status: AC
Start: 2023-04-14 — End: 2023-04-14
  Administered 2023-04-14: 0.2 mg via ORAL

## 2023-04-14 MED ORDER — SACUBITRIL-VALSARTAN 97-103 MG PO TABS: 1.0000 | ORAL_TABLET | Freq: Two times a day (BID) | ORAL | 0 refills | Status: DC

## 2023-04-14 MED ORDER — CARVEDILOL 12.5 MG PO TABS: 12.5000 mg | ORAL_TABLET | Freq: Two times a day (BID) | ORAL | 0 refills | Status: DC

## 2023-04-14 MED ORDER — AMLODIPINE BESYLATE 10 MG PO TABS: 10.0000 mg | ORAL_TABLET | Freq: Every day | ORAL | 0 refills | Status: DC

## 2023-04-14 MED ORDER — FUROSEMIDE 80 MG PO TABS: 80.0000 mg | ORAL_TABLET | Freq: Every day | ORAL | 0 refills | Status: DC

## 2023-04-14 NOTE — Patient Instructions (Addendum)
Your A1C is 5.9  I sent a refill of your medications to your pharmacy.  Please restart them today.  Continue checking your BP on a daily basis, keep a written log and take that with you to your visit with Dr. Laural Benes next week.  We will call you with today's lab results.  Roney Jaffe, PA-C Physician Assistant [facility] [email or web address]  Heart Failure Action Plan A heart failure action plan helps you understand what to do when you have symptoms of heart failure. Your action plan is a color-coded plan that lists the symptoms to watch for and indicates what actions to take. If you have symptoms in the red zone, you need medical care right away. If you have symptoms in the yellow zone, you are having problems. If you have symptoms in the green zone, you are doing well. Follow the plan that was created by you and your health care provider. Review your plan each time you visit your health care provider. Red zone These signs and symptoms mean you should get medical help right away: You have trouble breathing when resting. You have a dry cough that is getting worse. You have swelling or pain in your legs or abdomen that is getting worse. You suddenly gain more than 2-3 lb (0.9-1.4 kg) in 24 hours, or more than 5 lb (2.3 kg) in a week. This amount may be more or less depending on your condition. You have trouble staying awake or you feel confused. You have chest pain. You do not have an appetite. You pass out. You have worsening sadness or depression. If you have any of these symptoms, call your local emergency services (911 in the [place] right away. Do not drive yourself to the hospital. Yellow zone These signs and symptoms mean your condition may be getting worse and you should make some changes: You have trouble breathing when you are active, or you need to sleep with your head raised on extra pillows to help you breathe. You  have swelling in your legs or abdomen. You gain 2-3 lb (0.9-1.4 kg) in 24 hours, or 5 lb (2.3 kg) in a week. This amount may be more or less depending on your condition. You get tired easily. You have trouble sleeping. You have a dry cough. If you have any of these symptoms: Contact your health care provider within the next day. Your health care provider may adjust your medicines. Green zone These signs mean you are doing well and can continue what you are doing: You do not have shortness of breath. You have very little swelling or no new swelling. Your weight is stable (no gain or loss). You have a normal activity level. You do not have chest pain or any other new symptoms. Follow these instructions at home: Take over-the-counter and prescription medicines only as told by your health care provider. Weigh yourself daily. Your target weight is __________ lb (__________ kg). Call your health care provider if you gain more than __________ lb (__________ kg) in 24 hours, or more than __________ lb (__________ kg) in a week. Health care provider name: _____________________________________________________ Health care provider phone number: _____________________________________________________ Eat a heart-healthy diet. Work with a diet and nutrition specialist (dietitian) to create an eating plan that is best for you. Keep all follow-up visits. This is important. Where to find more information American Heart Association: Summary A heart failure action plan helps you understand what to do when you have symptoms of heart failure. Follow the  action plan that was created by you and your health care provider. Get help right away if you have any symptoms in the red zone. This information is not intended to replace advice given to you by your health care provider. Make sure you discuss any questions you have with your health care provider. Document Revised: 01/06/2022 Document Reviewed:  05/13/2020 Elsevier Patient Education  2024 [employer].

## 2023-04-14 NOTE — Progress Notes (Signed)
Established Patient Office Visit  Subjective   Patient ID: Richard Hale, male    DOB: 07/04/1986  Age: 37 y.o. MRN: [id number]  Chief Complaint  Patient presents with   Medication Refill    Expereincing SOB, been without lasix x2 days     States that he has been out of his medications for the past 2 days.  States that he feels like the fluid is building up and can feel it all the way up to his chest. Does endorse SOB on exertion.  States that he felp his sxs were mostly controlled with his current regimen.  States that he has an appointment with his primary care provider next week.    States that he has been checking his blood pressure at home, states that when he is taking his medication his blood pressure generally is 130/90.      Past Medical History:  Diagnosis Date   CHF (congestive heart failure) (HCC)    Hypertension    Morbid obesity ([facility])    Obstructive sleep apnea    CPAP   Tobacco use 04/06/2019   Social History   Socioeconomic History   Marital status: Married    Spouse name: Not on file   Number of children: Not on file   Years of education: Not on file   Highest education level: Not on file  Occupational History   Not on file  Tobacco Use   Smoking status: Every Day    Packs/day: .25    Types: Cigarettes   Smokeless tobacco: Never   Tobacco comments:    5 cigs a day  Vaping Use   Vaping Use: Never used  Substance and Sexual Activity   Alcohol use: Yes    Comment: occ   Drug use: Never   Sexual activity: Yes  Other Topics Concern   Not on file  Social History Narrative   Lives with girlfriend and her kids.  He has three children.  Works at a [employer] broadcast [employer].     Social Determinants of Health   Financial Resource Strain: Not on file  Food Insecurity: No Food Insecurity (07/02/2022)   Hunger Vital Sign    Worried About Running Out of Food in the Last Year: Never true    Ran Out of Food in the Last Year: Never true  Transportation Needs: No  Transportation Needs (07/02/2022)   PRAPARE - Administrator, Civil Service (Medical): No    Lack of Transportation (Non-Medical): No  Physical Activity: Not on file  Stress: Not on file  Social Connections: Not on file  Intimate Partner Violence: Not At Risk (07/02/2022)   Humiliation, Afraid, Rape, and Kick questionnaire    Fear of Current or Ex-Partner: No    Emotionally Abused: No    Physically Abused: No    Sexually Abused: No   Family History  Problem Relation Age of Onset   Hypertension Mother    Heart failure Mother    Diabetes Maternal Grandmother    CAD Father 58       Heart attack and Stroke   Allergies  Allergen Reactions   Lisinopril Cough    Review of Systems  Constitutional: Negative.   HENT: Negative.    Eyes: Negative.   Respiratory:  Negative for shortness of breath.   Cardiovascular:  Negative for chest pain.  Gastrointestinal: Negative.   Genitourinary: Negative.   Musculoskeletal: Negative.   Skin: Negative.   Neurological:  Negative for weakness and  headaches.  Endo/Heme/Allergies: Negative.   Psychiatric/Behavioral: Negative.        Objective:     BP (!) 189/130 (BP Location: Left Arm)   Pulse 78   Ht 6\' 2"  (1.88 m)   Wt (!) 398 lb (180.5 kg)   SpO2 97%   BMI 51.10 kg/m  BP Readings from Last 3 Encounters:  04/14/23 (!) 189/130  07/23/22 (!) 146/92  07/21/22 126/84   Wt Readings from Last 3 Encounters:  04/14/23 (!) 398 lb (180.5 kg)  07/23/22 (!) 399 lb (181 kg)  07/21/22 (!) 400 lb 3.2 oz (181.5 kg)      Physical Exam Vitals and nursing note reviewed.  Constitutional:      Appearance: Normal appearance.  HENT:     Head: Normocephalic and atraumatic.     Right Ear: External ear normal.     Left Ear: External ear normal.     Nose: Nose normal.     Mouth/Throat:     Mouth: Mucous membranes are moist.     Pharynx: Oropharynx is clear.  Eyes:     Extraocular Movements: Extraocular movements intact.      Conjunctiva/sclera: Conjunctivae normal.     Pupils: Pupils are equal, round, and reactive to light.  Cardiovascular:     Rate and Rhythm: Normal rate and regular rhythm.     Pulses: Normal pulses.     Heart sounds: Normal heart sounds.  Pulmonary:     Effort: Pulmonary effort is normal.     Breath sounds: Normal breath sounds.  Musculoskeletal:        General: Normal range of motion.     Cervical back: Normal range of motion and neck supple.  Skin:    General: Skin is warm and dry.  Neurological:     General: No focal deficit present.     Mental Status: He is alert and oriented to person, place, and time.  Psychiatric:        Mood and Affect: Mood normal.        Behavior: Behavior normal.        Thought Content: Thought content normal.        Judgment: Judgment normal.        Assessment & Plan:   Problem List Items Addressed This Visit       Cardiovascular and Mediastinum   Essential hypertension - Primary   Relevant Medications   spironolactone (ALDACTONE) 100 MG tablet   sacubitril-valsartan (ENTRESTO) 97-103 MG   furosemide (LASIX) 80 MG tablet   empagliflozin (JARDIANCE) 10 MG TABS tablet   carvedilol (COREG) 12.5 MG tablet   amLODipine (NORVASC) 10 MG tablet   cloNIDine (CATAPRES) tablet 0.2 mg   Other Relevant Orders   CBC with Differential/Platelet   Comp. Metabolic Panel (12)   Hypertensive urgency   Relevant Medications   spironolactone (ALDACTONE) 100 MG tablet   sacubitril-valsartan (ENTRESTO) 97-103 MG   furosemide (LASIX) 80 MG tablet   carvedilol (COREG) 12.5 MG tablet   amLODipine (NORVASC) 10 MG tablet   cloNIDine (CATAPRES) tablet 0.2 mg   Cardiomyopathy (HCC)   Relevant Medications   spironolactone (ALDACTONE) 100 MG tablet   sacubitril-valsartan (ENTRESTO) 97-103 MG   furosemide (LASIX) 80 MG tablet   carvedilol (COREG) 12.5 MG tablet   amLODipine (NORVASC) 10 MG tablet   cloNIDine (CATAPRES) tablet 0.2 mg   CHF (congestive heart  failure), NYHA class II, chronic, systolic (HCC)   Relevant Medications   spironolactone (ALDACTONE) 100 MG tablet  sacubitril-valsartan (ENTRESTO) 97-103 MG   furosemide (LASIX) 80 MG tablet   carvedilol (COREG) 12.5 MG tablet   amLODipine (NORVASC) 10 MG tablet   cloNIDine (CATAPRES) tablet 0.2 mg   Other Relevant Orders   Brain natriuretic peptide     Other   Class 3 severe obesity due to excess calories with serious comorbidity and body mass index (BMI) of 50.0 to 59.9 in adult [facility])   Relevant Medications   empagliflozin (JARDIANCE) 10 MG TABS tablet   Prediabetes   Relevant Orders   HgB A1c  1. Essential hypertension Continue regimen, patient encouraged to check BP at home on a daily basis, keep a written log and have available for all office visits.  Red flags given for prompt reevaluation.  Patient has appt with PCP on 04/19/23.   - empagliflozin (JARDIANCE) 10 MG TABS tablet; Take 1 tablet (10 mg total) by mouth daily.  Dispense: 30 tablet; Refill: 0 - carvedilol (COREG) 12.5 MG tablet; Take 1 tablet (12.5 mg total) by mouth 2 (two) times daily with a meal.  Dispense: 60 tablet; Refill: 0 - amLODipine (NORVASC) 10 MG tablet; Take 1 tablet (10 mg total) by mouth daily.  Dispense: 30 tablet; Refill: 0 - CBC with Differential/Platelet - Comp. Metabolic Panel (12)  2. Hypertensive urgency Patient given 0.2 mg in clinic - cloNIDine (CATAPRES) tablet 0.2 mg  3. Dilated cardiomyopathy (HCC)   4. Prediabetes A1C 5.9, improved from 6.2 10/23 - HgB A1c  5. CHF (congestive heart failure), NYHA class II, chronic, systolic (HCC) Continue current regimen - spironolactone (ALDACTONE) 100 MG tablet; Take 1 tablet (100 mg total) by mouth daily.  Dispense: 30 tablet; Refill: 0 - sacubitril-valsartan (ENTRESTO) 97-103 MG; Take 1 tablet by mouth 2 (two) times daily.  Dispense: 60 tablet; Refill: 0 - furosemide (LASIX) 80 MG tablet; Take 1 tablet (80 mg total) by mouth daily.  Dispense:  30 tablet; Refill: 0 - Brain natriuretic peptide  6. Class 3 severe obesity due to excess calories with serious comorbidity and body mass index (BMI) of 50.0 to 59.9 in adult [facility])    I have reviewed the patient's medical history (PMH, PSH, Social History, Family History, Medications, and allergies) , and have been updated if relevant. I spent 30 minutes reviewing chart and  face to face time with patient.     Return in about 5 days (around 04/19/2023) for At [facility]    Kasandra Knudsen Mayers, PA-C

## 2023-04-19 ENCOUNTER — Encounter: Payer: Self-pay | Admitting: Internal Medicine

## 2023-04-19 ENCOUNTER — Ambulatory Visit: Payer: BC Managed Care – PPO | Attending: Internal Medicine | Admitting: Internal Medicine

## 2023-04-19 ENCOUNTER — Other Ambulatory Visit: Payer: Self-pay

## 2023-04-19 VITALS — BP 139/97 | HR 85 | Temp 98.7°F | Ht 74.0 in | Wt >= 6400 oz

## 2023-04-19 DIAGNOSIS — I10 Essential (primary) hypertension: Secondary | ICD-10-CM

## 2023-04-19 DIAGNOSIS — R7303 Prediabetes: Secondary | ICD-10-CM

## 2023-04-19 DIAGNOSIS — Z6841 Body Mass Index (BMI) 40.0 and over, adult: Secondary | ICD-10-CM

## 2023-04-19 DIAGNOSIS — G4733 Obstructive sleep apnea (adult) (pediatric): Secondary | ICD-10-CM | POA: Diagnosis not present

## 2023-04-19 DIAGNOSIS — F17200 Nicotine dependence, unspecified, uncomplicated: Secondary | ICD-10-CM | POA: Diagnosis not present

## 2023-04-19 DIAGNOSIS — I5022 Chronic systolic (congestive) heart failure: Secondary | ICD-10-CM | POA: Diagnosis not present

## 2023-04-19 MED ORDER — SACUBITRIL-VALSARTAN 97-103 MG PO TABS
1.0000 | ORAL_TABLET | Freq: Two times a day (BID) | ORAL | 6 refills | Status: DC
Start: 2023-04-19 — End: 2023-05-17
  Filled 2023-04-19 (×2): qty 60, 30d supply, fill #0

## 2023-04-19 MED ORDER — VARENICLINE TARTRATE (STARTER) 0.5 MG X 11 & 1 MG X 42 PO TBPK
ORAL_TABLET | ORAL | 0 refills | Status: AC
Start: 2023-04-19 — End: ?
  Filled 2023-04-19 – 2023-10-10 (×3): qty 53, 28d supply, fill #0

## 2023-04-19 MED ORDER — EMPAGLIFLOZIN 10 MG PO TABS
10.0000 mg | ORAL_TABLET | Freq: Every day | ORAL | 6 refills | Status: AC
Start: 2023-04-19 — End: ?
  Filled 2023-04-19 (×3): qty 30, 30d supply, fill #0
  Filled 2023-08-12: qty 30, 30d supply, fill #1
  Filled 2023-10-10: qty 30, 30d supply, fill #2

## 2023-04-19 NOTE — Progress Notes (Signed)
Patient ID: Richard Hale, male    DOB: Mar 26, 1986  MRN: [id number]  CC: Hypertension (HTN f/u./Financial issues with [employer] - reports paying $700)   Subjective: Richard Hale is a 37 y.o. male who presents for chronic ds management. Wife, Archie Patten, is with him. His concerns today include:  Patient with history of HTN, preDM, cardiomyopathy (EF 45% in 2020, 20-25% 06/2022), OSA on CPAP, morbid obesity, tob dep   HTN/systolic CHF: Had seen our PA on the mobile van 04/14/2023 for medication refills.  Weight is up 7 pounds since that visit; he questions the accuracy of the scale at the medical mobile [facility] Last seen by Dr. Antoine Poche 07/2022.  Cardiac MRI was ordered but looks like it was not done. Should be on Norvasc 10 mg daily, spironolactone 100 mg daily, furosemide 80 mg daily, Jardiance 10 mg daily, carvedilol 12.5 mg twice a day and Entresto 97/103 mg twice a day. Out of Jardiance and Entresto x several days due to cost of $300 each with insurance.  Mom on same meds so he took a few of hers. -he limits salt in foods No SOB at rest/exertion.  No LE edema.  No CP  Tobacco dependence: Still smoking.  Chantix prescribed on last visit.  Did fill rxn but since moved and had packed them up with his things and not able to get to them at this time.  Would like new rxn  Morbid obesity/prediabetes: Lab Results  Component Value Date   HGBA1C 5.9 (A) 04/14/2023  -eating less and smaller portions, drinking more water.  Drinks sodas, sweet tea and gatorade.   Does a lot of walking at work  OSA: On last visit, he was not using his CPAP consistently because he felt it was too much pressure.  I advised that he decrease the pressure by 1-2 mmHg water pressure.  He did so but still not using the machine consistently.  He states that the mask leaks and he just cannot get used to the feel of the mask on his face.     Patient Active Problem List   Diagnosis Date Noted   Elevated troponin  07/02/2022   Influenza vaccine refused 11/21/2020   COVID-19 vaccination declined 11/21/2020   CHF (congestive heart failure), NYHA class II, chronic, systolic (HCC) 11/21/2020   Tobacco dependence 11/21/2020   Cardiomyopathy (HCC) 08/28/2019   Chest pain of uncertain etiology 08/28/2019   Acute coronary syndrome (HCC) 04/06/2019   Tobacco abuse 04/06/2019   Hypokalemia 04/06/2019   Hypertensive urgency 12/14/2018   Essential hypertension 09/05/2018   Obstructive sleep apnea 09/05/2018   Class 3 severe obesity due to excess calories with serious comorbidity and body mass index (BMI) of 50.0 to 59.9 in adult [facility]) 09/05/2018   Prediabetes 09/05/2018     Current Outpatient Medications on File Prior to Visit  Medication Sig Dispense Refill   amLODipine (NORVASC) 10 MG tablet Take 1 tablet (10 mg total) by mouth daily. 30 tablet 0   carvedilol (COREG) 12.5 MG tablet Take 1 tablet (12.5 mg total) by mouth 2 (two) times daily with a meal. 60 tablet 0   furosemide (LASIX) 80 MG tablet Take 1 tablet (80 mg total) by mouth daily. 30 tablet 0   spironolactone (ALDACTONE) 100 MG tablet Take 1 tablet (100 mg total) by mouth daily. 30 tablet 0   triamcinolone cream (KENALOG) 0.1 % Apply 1 Application topically 2 (two) times daily. 30 g 0   No current facility-administered  medications on file prior to visit.    Allergies  Allergen Reactions   Lisinopril Cough    Social History   Socioeconomic History   Marital status: Married    Spouse name: Not on file   Number of children: Not on file   Years of education: Not on file   Highest education level: Not on file  Occupational History   Not on file  Tobacco Use   Smoking status: Every Day    Packs/day: .25    Types: Cigarettes   Smokeless tobacco: Never   Tobacco comments:    5 cigs a day  Vaping Use   Vaping Use: Never used  Substance and Sexual Activity   Alcohol use: Yes    Comment: occ   Drug use: Never   Sexual activity: Yes   Other Topics Concern   Not on file  Social History Narrative   Lives with girlfriend and her kids.  He has three children.  Works at a [employer] broadcast assistant.     Social Determinants of Health   Financial Resource Strain: Not on file  Food Insecurity: No Food Insecurity (07/02/2022)   Hunger Vital Sign    Worried About Running Out of Food in the Last Year: Never true    Ran Out of Food in the Last Year: Never true  Transportation Needs: No Transportation Needs (07/02/2022)   PRAPARE - Administrator, Civil Service (Medical): No    Lack of Transportation (Non-Medical): No  Physical Activity: Not on file  Stress: Not on file  Social Connections: Not on file  Intimate Partner Violence: Not At Risk (07/02/2022)   Humiliation, Afraid, Rape, and Kick questionnaire    Fear of Current or Ex-Partner: No    Emotionally Abused: No    Physically Abused: No    Sexually Abused: No    Family History  Problem Relation Age of Onset   Hypertension Mother    Heart failure Mother    Diabetes Maternal Grandmother    CAD Father 34       Heart attack and Stroke    Past Surgical History:  Procedure Laterality Date   NOSE SURGERY      ROS: Review of Systems Negative except as stated above  PHYSICAL EXAM: BP (!) 139/97 (BP Location: Left Arm, Patient Position: Sitting, Cuff Size: Large)   Pulse 85   Temp 98.7 F (37.1 C) (Oral)   Ht 6\' 2"  (1.88 m)   Wt (!) 405 lb (183.7 kg)   SpO2 96%   BMI 52.00 kg/m   Wt Readings from Last 3 Encounters:  04/19/23 (!) 405 lb (183.7 kg)  04/14/23 (!) 398 lb (180.5 kg)  07/23/22 (!) 399 lb (181 kg)    Physical Exam Initially 148/94  General appearance - alert, well appearing, middle age AAM and in no distress Neck - supple, no significant adenopathy Chest - clear to auscultation, no wheezes, rales or rhonchi, symmetric air entry Heart - normal rate, regular rhythm, normal S1, S2, no murmurs, rubs, clicks or gallops Extremities - no LE  edema     Latest Ref Rng & Units 04/14/2023    3:46 PM 07/03/2022    6:19 AM 07/02/2022    3:49 AM  CMP  Glucose 70 - 99 mg/dL 90  409  811   BUN 6 - 20 mg/dL 15  15  12    Creatinine 0.76 - 1.27 mg/dL 9.14  7.82  9.56   Sodium 134 - 144  mmol/L 144  139  141   Potassium 3.5 - 5.2 mmol/L 3.8  3.5  3.5   Chloride 96 - 106 mmol/L 106  107  108   CO2 22 - 32 mmol/L  26  28   Calcium 8.7 - 10.2 mg/dL 9.0  8.9  8.7   Total Protein 6.0 - 8.5 g/dL 7.0   7.1   Total Bilirubin 0.0 - 1.2 mg/dL 0.4   0.6   Alkaline Phos 44 - 121 IU/L 92   78   AST 0 - 40 IU/L 21   32   ALT 0 - 44 U/L   25    Lipid Panel     Component Value Date/Time   CHOL 121 11/24/2021 1043   TRIG 83 11/24/2021 1043   HDL 54 11/24/2021 1043   CHOLHDL 2.2 11/24/2021 1043   CHOLHDL 3.1 04/07/2019 0856   VLDL 29 04/07/2019 0856   LDLCALC 51 11/24/2021 1043    CBC    Component Value Date/Time   WBC 7.2 04/14/2023 1546   WBC 6.3 07/03/2022 0619   RBC 4.73 04/14/2023 1546   RBC 4.82 07/03/2022 0619   HGB 13.6 04/14/2023 1546   HCT 42.2 04/14/2023 1546   PLT 280 04/14/2023 1546   MCV 89 04/14/2023 1546   MCH 28.8 04/14/2023 1546   MCH 30.1 07/03/2022 0619   MCHC 32.2 04/14/2023 1546   MCHC 32.7 07/03/2022 0619   RDW 12.9 04/14/2023 1546   LYMPHSABS 3.0 04/14/2023 1546   MONOABS 0.6 07/02/2022 0349   EOSABS 0.5 (H) 04/14/2023 1546   BASOSABS 0.0 04/14/2023 1546    ASSESSMENT AND PLAN: 1. Essential hypertension Not at goal but patient has been out of Entresto for several days due to cost.  I have spoken with our clinical pharmacist.  He thinks that if the patient feels the prescription through our pharmacy he should be able to get a discount card.  Patient is willing to have the prescription sent to our pharmacy for the [facility]. He will continue his other medications including amlodipine 10 mg daily, spironolactone 100 mg daily, carvedilol 12.5 mg twice a day and furosemide 80 mg daily.  2. CHF (congestive heart  failure), NYHA class II, chronic, systolic (HCC) Compensated.  Advised to continue to limit salt in the foods is much as possible. He is agreeable to getting the [facility] [employer] and Jardiance filled through our pharmacy with a discount card.  If the medications are still out of reach in terms of cost, we will change Entresto to Valsartan. Advised to call his cardiologist to get cardiac MRI rescheduled.  We will also get him back in with Dr. Antoine Poche for 6 months follow-up. - Ambulatory referral to Cardiology - sacubitril-valsartan (ENTRESTO) 97-103 MG; Take 1 tablet by mouth 2 (two) times daily.  Dispense: 60 tablet; Refill: 6 - empagliflozin (JARDIANCE) 10 MG TABS tablet; Take 1 tablet (10 mg total) by mouth daily.  Dispense: 30 tablet; Refill: 6  3. Tobacco dependence Pt is current smoker. Patient advised to quit smoking. Discussed health risks associated with smoking including lung and other types of cancers, chronic lung diseases and CV risks.. Pt ready to give trail of quitting.   He requests prescription be sent for Chantix again to the pharmacy. Pt wanting to try: Chantix.  Starting pack sent to our pharmacy.  Once he is done with the starter pack, he should let me know so that we can then send the prescription for the continuation packs. _3_  Minutes spent on counseling. F/U: Assess progress on subsequent visit  - Varenicline Tartrate, Starter, (CHANTIX STARTING MONTH PAK) 0.5 MG X 11 & 1 MG X 42 TBPK; 0.5 mg PO daily day 1-3, then BID day 4-7 then 1 tab BID  Dispense: 53 each; Refill: 0  4. OSA (obstructive sleep apnea) As a last resort, I had recommended referring him for mask desensitization.  Probably not a candidate for inspire due to him being morbidly obese. - Desensitization mask fit; Future  5. Morbid obesity with BMI of 50.0-59.9, adult [facility]) Discussed on encourage healthy eating habits.  Printed information given.  He reports having seen a dietitian in the past. Encouraged him to  eliminate sugary drinks from his diet.  6. Prediabetes See #5 above.   Patient was given the opportunity to ask questions.  Patient verbalized understanding of the plan and was able to repeat key elements of the plan.   This documentation was completed using Paediatric nurse.  Any transcriptional errors are unintentional.  Orders Placed This Encounter  Procedures   Ambulatory referral to Cardiology   Desensitization mask fit     Requested Prescriptions   Signed Prescriptions Disp Refills   Varenicline Tartrate, Starter, (CHANTIX STARTING MONTH PAK) 0.5 MG X 11 & 1 MG X 42 TBPK 53 each 0    Sig: Take as directed   empagliflozin (JARDIANCE) 10 MG TABS tablet 30 tablet 6    Sig: Take 1 tablet (10 mg total) by mouth daily.   sacubitril-valsartan (ENTRESTO) 97-103 MG 60 tablet 6    Sig: Take 1 tablet by mouth 2 (two) times daily.    Return in about 4 months (around 08/20/2023) for Appt with [facility] in 4 wks for BP check.  Jonah Blue, MD, FACP

## 2023-04-19 NOTE — Patient Instructions (Signed)

## 2023-04-20 ENCOUNTER — Other Ambulatory Visit: Payer: Self-pay

## 2023-04-24 LAB — CBC WITH DIFFERENTIAL/PLATELET
Basophils Absolute: 0 10*3/uL (ref 0.0–0.2)
Basos: 1 %
EOS (ABSOLUTE): 0.5 10*3/uL — ABNORMAL HIGH (ref 0.0–0.4)
Eos: 7 %
Hematocrit: 42.2 % (ref 37.5–51.0)
Hemoglobin: 13.6 g/dL (ref 13.0–17.7)
Immature Grans (Abs): 0 10*3/uL (ref 0.0–0.1)
Immature Granulocytes: 0 %
Lymphocytes Absolute: 3 10*3/uL (ref 0.7–3.1)
Lymphs: 41 %
MCH: 28.8 pg (ref 26.6–33.0)
MCHC: 32.2 g/dL (ref 31.5–35.7)
MCV: 89 fL (ref 79–97)
Monocytes Absolute: 0.5 10*3/uL (ref 0.1–0.9)
Monocytes: 7 %
Neutrophils Absolute: 3.2 10*3/uL (ref 1.4–7.0)
Neutrophils: 44 %
Platelets: 280 10*3/uL (ref 150–450)
RBC: 4.73 x10E6/uL (ref 4.14–5.80)
RDW: 12.9 % (ref 11.6–15.4)
WBC: 7.2 10*3/uL (ref 3.4–10.8)

## 2023-04-24 LAB — COMP. METABOLIC PANEL (12)
AST: 21 IU/L (ref 0–40)
Albumin: 4.1 g/dL (ref 4.1–5.1)
Alkaline Phosphatase: 92 IU/L (ref 44–121)
BUN/Creatinine Ratio: 15 (ref 9–20)
BUN: 15 mg/dL (ref 6–20)
Bilirubin Total: 0.4 mg/dL (ref 0.0–1.2)
Calcium: 9 mg/dL (ref 8.7–10.2)
Chloride: 106 mmol/L (ref 96–106)
Creatinine, Ser: 1 mg/dL (ref 0.76–1.27)
Globulin, Total: 2.9 g/dL (ref 1.5–4.5)
Glucose: 90 mg/dL (ref 70–99)
Potassium: 3.8 mmol/L (ref 3.5–5.2)
Sodium: 144 mmol/L (ref 134–144)
Total Protein: 7 g/dL (ref 6.0–8.5)
eGFR: 100 mL/min/{1.73_m2} (ref >59)

## 2023-04-24 LAB — BRAIN NATRIURETIC PEPTIDE

## 2023-04-27 ENCOUNTER — Other Ambulatory Visit: Payer: Self-pay

## 2023-05-12 NOTE — Progress Notes (Unsigned)
Cardiology Office Note:   Date:  05/13/2023  ID:  Richard Hale, DOB Oct 25, 1985, MRN [id number] PCP: [name], MD  [facility] Providers Cardiologist:  Rollene Rotunda, MD {  History of Present Illness:   Richard Hale is a 37 y.o. male  who presents for evaluation of chest pain.   He had an echo in June with an EF of 45%.   He was hospitalized in March 2020 with hypertensive urgency due to running out of his medication.  EF was 20 - 25%.  He had uncontrolled HTN.  Trop elevation was thought to be related to demand ischemia.  Clonidine, HCTZ and ARB were stopped on discharge.   He was sent home on meds as below.   After the last visit MRI was ordered and is to be done in December.  However, he was unable to get this I think because of scheduling.  He had some trouble getting his medications because even with his insurance is been very expensive.  He does well if he takes his Lasix.  He will get short of breath if he forgets this.  He is denying any new PND or orthopnea.  He had no new palpitations, presyncope or syncope.  He had no weight gain or edema.  ROS: As stated in the HPI and negative for all other systems.  Studies Reviewed:    EKG:   EKG Interpretation Date/Time:  Thursday May 13 2023 10:01:35 EDT Ventricular Rate:  84 PR Interval:  190 QRS Duration:  124 QT Interval:  384 QTC Calculation: 453 R Axis:   -35  Text Interpretation: Normal sinus rhythm Left axis deviation Left ventricular hypertrophy with QRS widening ( R in aVL , Cornell product ) Nonspecific T wave abnormality When compared with ECG of 01-Jul-2022 20:34, No significant change since last tracing Confirmed by Rollene Rotunda ([id number]) on 05/13/2023 10:11:31 AM     Risk Assessment/Calculations:       Physical Exam:   VS:  BP (!) 132/92 (BP Location: Left Arm, Patient Position: Sitting, Cuff Size: Large)   Pulse 84   Ht 6\' 2"  (1.88 m)   Wt (!) 412 lb 3.2 oz (187 kg)   SpO2 96%   BMI  52.92 kg/m    Wt Readings from Last 3 Encounters:  05/13/23 (!) 412 lb 3.2 oz (187 kg)  04/19/23 (!) 405 lb (183.7 kg)  04/14/23 (!) 398 lb (180.5 kg)     GEN: Well nourished, well developed in no acute distress NECK: No JVD; No carotid bruits CARDIAC: RRR, no murmurs, rubs, gallops RESPIRATORY:  Clear to auscultation without rales, wheezing or rhonchi  ABDOMEN: Soft, non-tender, non-distended EXTREMITIES:  No edema; No deformity   ASSESSMENT AND PLAN:   ACUTE SYSTOLIC HF: Today I am going to increase his carvedilol to 25 mg twice daily.  He will remain on the other meds as listed.  Will look into getting him help with a co-pay card or otherwise so he does not struggle so much financially with his medicines.  He is very good about being compliant with them.  He is good to with soft and fluid restriction.  I am going to reschedule the MRI and look for infiltrative diseases.  I do not strongly suspect ischemia but I will do this workup as well.  Soon I will follow-up with an echo for reassessment of his EF as he is on his medicines currently and is almost on OMT.    MORBID  OBESITY:   Eventually I will look into getting him a GLP-1 receptor agonist.    TOBACCO ABUSE : Again today I told him about the need to stop smoking.   HTN:   This is being managed in context of treating his heart failure.    SLEEP APNEA:   He is actually going to have another sleep study.  He sleeps on his stomach and has had trouble with the current mask.     Follow up APP in 2 months.    Signed, Rollene Rotunda, MD

## 2023-05-13 ENCOUNTER — Encounter: Payer: Self-pay | Admitting: Cardiology

## 2023-05-13 ENCOUNTER — Other Ambulatory Visit (HOSPITAL_COMMUNITY): Payer: Self-pay

## 2023-05-13 ENCOUNTER — Ambulatory Visit: Payer: BC Managed Care – PPO | Attending: Cardiology | Admitting: Cardiology

## 2023-05-13 VITALS — BP 132/92 | HR 84 | Ht 74.0 in | Wt >= 6400 oz

## 2023-05-13 DIAGNOSIS — I10 Essential (primary) hypertension: Secondary | ICD-10-CM

## 2023-05-13 DIAGNOSIS — I5022 Chronic systolic (congestive) heart failure: Secondary | ICD-10-CM

## 2023-05-13 MED ORDER — CARVEDILOL 25 MG PO TABS
25.0000 mg | ORAL_TABLET | Freq: Two times a day (BID) | ORAL | 3 refills | Status: DC
Start: 2023-05-13 — End: 2023-05-17

## 2023-05-13 NOTE — Patient Instructions (Signed)
Medication Instructions:   INCREASE CARVEDILOL TO 25 MG TWICE DAILY= 2 OD THE 12.5 MG TABLETS TWICE DAILY  *If you need a refill on your cardiac medications before your next appointment, please call your pharmacy   Testing/Procedures:  You are scheduled for Cardiac MRI on ______________. Please arrive for your appointment at ______________ ( arrive 30-45 minutes prior to test start time). ?  [facility] [address] [place], [place] [address] [phone] Please take advantage of the free valet parking available at the [facility] and [employer] (Entrance C).  Proceed to the [facility] Radiology Department (First Floor) for check-in.   OR   [facility] [address] [place], [place] [address] [phone] Please go to the [facility] and check-in with the desk attendant.   Magnetic resonance imaging (MRI) is a painless test that produces images of the inside of the body without using Xrays.  During an MRI, strong magnets and radio waves work together in a Data processing manager to form detailed images.   MRI images may provide more details about a medical condition than X-rays, CT scans, and ultrasounds can provide.  You may be given earphones to listen for instructions.  You may eat a light breakfast and take medications as ordered with the exception of furosemide, hydrochlorothiazide, or spironolactone(fluid pill, other). Please avoid stimulants for 12 hr prior to test. (Ie. Caffeine, nicotine, chocolate, or antihistamine medications)  If a contrast material will be used, an IV will be inserted into one of your veins. Contrast material will be injected into your IV. It will leave your body through your urine within a day. You may be told to drink plenty of fluids to help flush the contrast material out of your system.  You will be asked to remove all metal, including: Watch, jewelry, and other metal objects including hearing aids,  hair pieces and dentures. Also wearable glucose monitoring systems (ie. Freestyle Libre and Omnipods) (Braces and fillings normally are not a problem.)   TEST WILL TAKE APPROXIMATELY 1 HOUR  PLEASE NOTIFY SCHEDULING AT LEAST 24 HOURS IN ADVANCE IF YOU ARE UNABLE TO KEEP YOUR APPOINTMENT. [phone]  For more information and frequently asked questions, please visit our website : [email or web address]  Please call Rockwell Alexandria, cardiac imaging nurse navigator with any questions/concerns. Cardiac Imaging Nurse Navigators  Heart and Vascular Services [phone] Office     Follow-Up: At [facility], you and your health needs are our priority.  As part of our continuing mission to provide you with exceptional heart care, we have created designated Provider Care Teams.  These Care Teams include your primary Cardiologist (physician) and Advanced Practice Providers (APPs -  Physician Assistants and Nurse Practitioners) who all work together to provide you with the care you need, when you need it.  We recommend signing up for the patient portal called "MyChart".  Sign up information is provided on this After Visit Summary.  MyChart is used to connect with patients for Virtual Visits (Telemedicine).  Patients are able to view lab/test results, encounter notes, upcoming appointments, etc.  Non-urgent messages can be sent to your provider as well.   To learn more about what you can do with MyChart, go to [email or web address].    Your next appointment:   2 month(s)  Provider:   Rollene Rotunda, MD

## 2023-05-17 ENCOUNTER — Other Ambulatory Visit: Payer: Self-pay

## 2023-05-17 ENCOUNTER — Ambulatory Visit: Payer: BC Managed Care – PPO | Attending: Internal Medicine | Admitting: Pharmacist

## 2023-05-17 VITALS — BP 143/97 | HR 87

## 2023-05-17 DIAGNOSIS — I10 Essential (primary) hypertension: Secondary | ICD-10-CM | POA: Diagnosis not present

## 2023-05-17 DIAGNOSIS — I5022 Chronic systolic (congestive) heart failure: Secondary | ICD-10-CM

## 2023-05-17 MED ORDER — CARVEDILOL 25 MG PO TABS
25.0000 mg | ORAL_TABLET | Freq: Two times a day (BID) | ORAL | 1 refills | Status: DC
Start: 2023-05-17 — End: 2024-01-12
  Filled 2023-05-17: qty 180, 90d supply, fill #0
  Filled 2023-08-12: qty 60, 30d supply, fill #0
  Filled 2023-10-10: qty 60, 30d supply, fill #1

## 2023-05-17 MED ORDER — SPIRONOLACTONE 100 MG PO TABS
100.0000 mg | ORAL_TABLET | Freq: Every day | ORAL | 1 refills | Status: DC
Start: 2023-05-17 — End: 2024-07-13
  Filled 2023-05-17: qty 90, 90d supply, fill #0
  Filled 2023-08-12: qty 30, 30d supply, fill #0
  Filled 2023-10-10: qty 30, 30d supply, fill #1

## 2023-05-17 MED ORDER — SACUBITRIL-VALSARTAN 97-103 MG PO TABS
1.0000 | ORAL_TABLET | Freq: Two times a day (BID) | ORAL | 6 refills | Status: AC
  Filled 2023-05-17 – 2023-08-12 (×2): qty 60, 30d supply, fill #0
  Filled 2023-10-10: qty 60, 30d supply, fill #1

## 2023-05-17 MED ORDER — FUROSEMIDE 80 MG PO TABS
80.0000 mg | ORAL_TABLET | Freq: Every day | ORAL | 1 refills | Status: DC
Start: 2023-05-17 — End: 2024-05-15
  Filled 2023-05-17 – 2023-07-21 (×2): qty 90, 90d supply, fill #0
  Filled 2023-08-12: qty 30, 30d supply, fill #0
  Filled 2023-10-10 – 2023-10-30 (×2): qty 30, 30d supply, fill #1
  Filled 2023-12-29 – 2024-01-10 (×2): qty 30, 30d supply, fill #2
  Filled 2024-04-11 – 2024-05-15 (×2): qty 30, 30d supply, fill #3

## 2023-05-17 MED ORDER — AMLODIPINE BESYLATE 10 MG PO TABS
10.0000 mg | ORAL_TABLET | Freq: Every day | ORAL | 1 refills | Status: DC
Start: 2023-05-17 — End: 2024-06-10
  Filled 2023-05-17 – 2023-07-21 (×2): qty 90, 90d supply, fill #0
  Filled 2023-08-12: qty 30, 30d supply, fill #0
  Filled 2023-10-10 – 2023-10-30 (×2): qty 30, 30d supply, fill #1
  Filled 2024-04-11: qty 30, 30d supply, fill #2

## 2023-05-17 NOTE — Progress Notes (Signed)
   S:     No chief complaint on file.  37 y.o. male who presents for hypertension evaluation, education, and management.  PMH is significant for HTN, hx of ACS with NICM (last EF 20-25% noted 06/2022), obesity, preDM.  Patient was referred and last seen by Primary Care Provider, Dr. Laural Benes, on 04/19/2023.   At last visit, BP was 139/97 mmHg. His rxns were sent to our pharmacy to assist with his copays.   Today, patient arrives in good spirits and presents without assistance. Denies dizziness, headache, blurred vision, swelling.   Patient reports hypertension is longstanding.   Family/Social history:  Fhx: HTN, CHF, DM, CAD Tobacco: current 0.3 PPD smoker  Alcohol: none reported   Medication adherence reported. Patient has taken BP medications today.   Current antihypertensives include: amlodipine 10 mg daily, carvedilol 25 mg BID, Entresto 97-103 mg BID, Lasix 80 mg daily, spironolactone 100 mg daily   Recent fill dates:  -Amlodipine 10 mg tablets: 30-day, 04/14/23 -Carvedilol 25 mg tablets, 90-day supply, 05/13/23 -Furosemide 80 mg tablets, 90-day supply, 04/14/23 -Entresto: 97-103 mg, 90-day supply, 04/19/2023 -Spironolactone 100 mg tablets, 90-day supply, 08/16/2022  Reported home BP readings: none  Patient reported dietary habits:  -Compliant with sodium restriction   Patient-reported exercise habits:  -Walks daily at work but none outside of work.   O:  Vitals:   05/17/23 1058  BP: (!) 143/97  Pulse: 87   Last 3 Office BP readings: BP Readings from Last 3 Encounters:  05/13/23 (!) 132/92  04/19/23 (!) 139/97  04/14/23 (!) 178/116   BMET    Component Value Date/Time   NA 144 04/14/2023 1546   K 3.8 04/14/2023 1546   CL 106 04/14/2023 1546   CO2 26 07/03/2022 0619   GLUCOSE 90 04/14/2023 1546   GLUCOSE 114 (H) 07/03/2022 0619   BUN 15 04/14/2023 1546   CREATININE 1.00 04/14/2023 1546   CALCIUM 9.0 04/14/2023 1546   GFRNONAA >60 07/03/2022 0619   GFRAA 111  11/21/2020 1152    Renal function: CrCl cannot be calculated (Patient's most recent lab result is older than the maximum 21 days allowed.).  Clinical ASCVD: YES The ASCVD Risk score (Arnett DK, et al., 2019) failed to calculate for the following reasons:   The 2019 ASCVD risk score is only valid for ages 31 to 2  Patient is participating in a Managed Medicaid Plan:  Yes    A/P: Hypertension diagnosed currently uncontrolled on current medications. BP goal < 130/80 mmHg. Medication adherence appears to be suboptimal. He has not filled spironolactone since 08/2022. -Continued current regimen.  -Emphasized adherence.  -Counseled on lifestyle modifications for blood pressure control including reduced dietary sodium, increased exercise, adequate sleep. -Encouraged patient to check BP at home and bring log of readings to next visit. Counseled on proper use of home BP cuff.   Results reviewed and written information provided.    Written patient instructions provided. Patient verbalized understanding of treatment plan.  Total time in face to face counseling 20 minutes.    Follow-up:  Pharmacist in 1 month.  Butch Penny, PharmD, Patsy Baltimore, CPP Clinical Pharmacist [facility] [phone]

## 2023-05-24 ENCOUNTER — Other Ambulatory Visit: Payer: Self-pay

## 2023-06-01 ENCOUNTER — Other Ambulatory Visit (HOSPITAL_BASED_OUTPATIENT_CLINIC_OR_DEPARTMENT_OTHER): Payer: BC Managed Care – PPO

## 2023-06-17 ENCOUNTER — Ambulatory Visit: Payer: BC Managed Care – PPO | Admitting: Pharmacist

## 2023-06-17 NOTE — Progress Notes (Unsigned)
   S:     No chief complaint on file.  37 y.o. male who presents for hypertension evaluation, education, and management.  PMH is significant for HTN, hx ACS w/ NICM (last EF 20-25% noted 06/2022), obesity, pre-diabetes.  Patient was referred and last seen by Primary Care Provider, Dr. Laural Benes, on 04/19/2023. Patient was last seen by Pharmacy clinic on 05/17/23.   At last visit, BP was 143/97. Fill records indicated he hadn't filled spironolactone in 6 months. Adherence to medications was stressed and no other changes were made.  Today, patient arrives in *** spirits and presents without *** assistance. *** Denies dizziness, headache, blurred vision, swelling.   Patient reports hypertension is longstanding  Family/Social history:  Fhx: HTN, CHF, DM, CAD Tobacco: current 0.3 PPD smoker  Alcohol: none reported   Medication adherence *** . Patient has *** taken BP medications today.   Current antihypertensives include: amlodipine 10 mg daily, carvedilol 25 mg BID, Entresto 97-103 mg BID, Lasix 80 mg daily, spironolactone 100 mg daily   Reported home BP readings: ***  Patient reported dietary habits: Eats *** meals/day Breakfast: *** Lunch: *** Dinner: *** Snacks: *** Drinks: ***  Patient-reported exercise habits: ***  ASCVD risk factors include: ***  O:  ROS  Physical Exam  Last 3 Office BP readings: BP Readings from Last 3 Encounters:  05/17/23 (!) 143/97  05/13/23 (!) 132/92  04/19/23 (!) 139/97    BMET    Component Value Date/Time   NA 144 04/14/2023 1546   K 3.8 04/14/2023 1546   CL 106 04/14/2023 1546   CO2 26 07/03/2022 0619   GLUCOSE 90 04/14/2023 1546   GLUCOSE 114 (H) 07/03/2022 0619   BUN 15 04/14/2023 1546   CREATININE 1.00 04/14/2023 1546   CALCIUM 9.0 04/14/2023 1546   GFRNONAA >60 07/03/2022 0619   GFRAA 111 11/21/2020 1152    Renal function: CrCl cannot be calculated (Patient's most recent lab result is older than the maximum 21 days  allowed.).  Clinical ASCVD: {YES/NO:21197} The ASCVD Risk score (Arnett DK, et al., 2019) failed to calculate for the following reasons:   The 2019 ASCVD risk score is only valid for ages 76 to 43  Patient is participating in a Managed Medicaid Plan:  {MM YES/NO:27447::"Yes"}    A/P: Hypertension diagnosed *** currently *** on current medications. BP goal < 130/80 *** mmHg. Medication adherence appears ***. Control is suboptimal due to ***.  -{Meds adjust:18428} ***.  -{Meds adjust:18428} ***.  -Patient educated on purpose, proper use, and potential adverse effects of ***.  -F/u labs ordered - *** -Counseled on lifestyle modifications for blood pressure control including reduced dietary sodium, increased exercise, adequate sleep. -Encouraged patient to check BP at home and bring log of readings to next visit. Counseled on proper use of home BP cuff.   Results reviewed and written information provided.    Written patient instructions provided. Patient verbalized understanding of treatment plan.  Total time in face to face counseling *** minutes.    Follow-up:  Pharmacist ***. PCP clinic visit in ***.  Patient seen with ***

## 2023-07-21 ENCOUNTER — Other Ambulatory Visit: Payer: Self-pay

## 2023-07-22 NOTE — Progress Notes (Deleted)
Cardiology Office Note:   Date:  07/22/2023  ID:  Richard Hale, DOB Nov 12, 1985, MRN [id number] PCP: [name], MD  [facility] Providers Cardiologist:  Rollene Rotunda, MD {  History of Present Illness:   Richard Hale is a 37 y.o. male   who presents for evaluation of chest pain.   He had an echo in June with an EF of 45%.   He was hospitalized in March 2020 with hypertensive urgency due to running out of his medication.  EF was 20 - 25%.  He had uncontrolled HTN.  Trop elevation was thought to be related to demand ischemia.  Clonidine, HCTZ and ARB were stopped on discharge.   He was sent home on meds as below.   After the last visit MRI was ordered and is to be done in December.  However, he was unable to get this I think because of scheduling.  He had some trouble getting his medications because even with his insurance is been very expensive.   Since I last saw him  ***  ***  He does well if he takes his Lasix.  He will get short of breath if he forgets this.  He is denying any new PND or orthopnea.  He had no new palpitations, presyncope or syncope.  He had no weight gain or edema.    ROS: ***  Studies Reviewed:    EKG:       ***  Risk Assessment/Calculations:   {Does this patient have ATRIAL FIBRILLATION?:[phone]} No BP recorded.  {Refresh Note OR Click here to enter BP  :1}***        Physical Exam:   VS:  There were no vitals taken for this visit.   Wt Readings from Last 3 Encounters:  05/13/23 (!) 412 lb 3.2 oz (187 kg)  04/19/23 (!) 405 lb (183.7 kg)  04/14/23 (!) 398 lb (180.5 kg)     GEN: Well nourished, well developed in no acute distress NECK: No JVD; No carotid bruits CARDIAC: ***RRR, no murmurs, rubs, gallops RESPIRATORY:  Clear to auscultation without rales, wheezing or rhonchi  ABDOMEN: Soft, non-tender, non-distended EXTREMITIES:  No edema; No deformity   ASSESSMENT AND PLAN:   CHRONIC SYSTOLIC HF:  ***  Today I am going to  increase his carvedilol to 25 mg twice daily.  He will remain on the other meds as listed.  Will look into getting him help with a co-pay card or otherwise so he does not struggle so much financially with his medicines.  He is very good about being compliant with them.  He is good to with soft and fluid restriction.  I am going to reschedule the MRI and look for infiltrative diseases.  I do not strongly suspect ischemia but I will do this workup as well.  Soon I will follow-up with an echo for reassessment of his EF as he is on his medicines currently and is almost on OMT.    MORBID OBESITY:  ***   Eventually I will look into getting him a GLP-1 receptor agonist.    TOBACCO ABUSE : ***  Again today I told him about the need to stop smoking.   HTN:   ***    This is being managed in context of treating his heart failure.    SLEEP APNEA:   ***  He is actually going to have another sleep study.  He sleeps on his stomach and has had trouble with the current mask.      {  Are you ordering a CV Procedure (e.g. stress test, cath, DCCV, TEE, etc)?   Press F2        :[id number]}  Follow up ***  Signed, Rollene Rotunda, MD

## 2023-07-23 ENCOUNTER — Ambulatory Visit: Payer: BC Managed Care – PPO | Admitting: Cardiology

## 2023-07-23 ENCOUNTER — Ambulatory Visit: Payer: BC Managed Care – PPO | Attending: Cardiology | Admitting: Cardiology

## 2023-07-23 DIAGNOSIS — I5022 Chronic systolic (congestive) heart failure: Secondary | ICD-10-CM

## 2023-07-23 DIAGNOSIS — I10 Essential (primary) hypertension: Secondary | ICD-10-CM

## 2023-07-23 DIAGNOSIS — Z720 Tobacco use: Secondary | ICD-10-CM

## 2023-07-23 DIAGNOSIS — G4730 Sleep apnea, unspecified: Secondary | ICD-10-CM

## 2023-07-26 ENCOUNTER — Other Ambulatory Visit: Payer: Self-pay

## 2023-08-03 ENCOUNTER — Other Ambulatory Visit: Payer: Self-pay

## 2023-08-12 ENCOUNTER — Other Ambulatory Visit: Payer: Self-pay | Admitting: Internal Medicine

## 2023-08-12 DIAGNOSIS — L309 Dermatitis, unspecified: Secondary | ICD-10-CM

## 2023-08-12 MED ORDER — TRIAMCINOLONE ACETONIDE 0.1 % EX CREA
1.0000 | TOPICAL_CREAM | Freq: Two times a day (BID) | CUTANEOUS | 0 refills | Status: AC
Start: 2023-08-12 — End: ?
  Filled 2023-08-12 – 2023-10-10 (×2): qty 30, 15d supply, fill #0

## 2023-08-13 ENCOUNTER — Other Ambulatory Visit: Payer: Self-pay

## 2023-08-17 ENCOUNTER — Other Ambulatory Visit: Payer: Self-pay

## 2023-08-30 ENCOUNTER — Ambulatory Visit: Payer: Self-pay | Admitting: Internal Medicine

## 2023-09-05 ENCOUNTER — Ambulatory Visit
Admission: EM | Admit: 2023-09-05 | Discharge: 2023-09-05 | Disposition: A | Discharge status: Home / Self Care | Payer: BC Managed Care – PPO | Attending: Physician Assistant | Admitting: Physician Assistant

## 2023-09-05 DIAGNOSIS — K0889 Other specified disorders of teeth and supporting structures: Secondary | ICD-10-CM | POA: Diagnosis not present

## 2023-09-05 MED ORDER — AMOXICILLIN 500 MG PO CAPS: 500.0000 mg | ORAL_CAPSULE | Freq: Three times a day (TID) | ORAL | 0 refills | Status: DC

## 2023-09-05 NOTE — ED Provider Notes (Signed)
[facility]    CSN: [id number] Arrival date & time: 09/05/23  0851      History   Chief Complaint Chief Complaint  Patient presents with   Dental Pain    HPI Richard Hale is a 37 y.o. male.   Patient here today for evaluation of dental pain that started about 2 days ago.  He notes that pain is present to his right upper molar area.  He denies any fever.  He does not currently have a dentist.  The history is provided by the patient.  Dental Pain Associated symptoms: no facial swelling and no fever     Past Medical History:  Diagnosis Date   CHF (congestive heart failure) (HCC)    Hypertension    Morbid obesity (HCC)    Obstructive sleep apnea    CPAP   Tobacco use 04/06/2019    Patient Active Problem List   Diagnosis Date Noted   Elevated troponin 07/02/2022   Influenza vaccine refused 11/21/2020   COVID-19 vaccination declined 11/21/2020   CHF (congestive heart failure), NYHA class II, chronic, systolic (HCC) 11/21/2020   Tobacco dependence 11/21/2020   Cardiomyopathy (HCC) 08/28/2019   Chest pain of uncertain etiology 08/28/2019   Acute coronary syndrome (HCC) 04/06/2019   Tobacco abuse 04/06/2019   Hypokalemia 04/06/2019   Hypertensive urgency 12/14/2018   Essential hypertension 09/05/2018   Obstructive sleep apnea 09/05/2018   Class 3 severe obesity due to excess calories with serious comorbidity and body mass index (BMI) of 50.0 to 59.9 in adult [facility]) 09/05/2018   Prediabetes 09/05/2018    Past Surgical History:  Procedure Laterality Date   NOSE SURGERY         Home Medications    Prior to Admission medications   Medication Sig Start Date End Date Taking? Authorizing Provider  amLODipine (NORVASC) 10 MG tablet Take 1 tablet (10 mg total) by mouth daily. 05/17/23  Yes [name], MD  amoxicillin (AMOXIL) 500 MG capsule Take 1 capsule (500 mg total) by mouth 3 (three) times daily. 09/05/23  Yes Tomi Bamberger, PA-C   carvedilol (COREG) 25 MG tablet Take 1 tablet (25 mg total) by mouth 2 (two) times daily with a meal. 05/17/23  Yes [name], MD  empagliflozin (JARDIANCE) 10 MG TABS tablet Take 1 tablet (10 mg total) by mouth daily. 04/19/23  Yes [name], MD  furosemide (LASIX) 80 MG tablet Take 1 tablet (80 mg total) by mouth daily. 05/17/23  Yes [name], MD  sacubitril-valsartan (ENTRESTO) 97-103 MG Take 1 tablet by mouth 2 (two) times daily. 05/17/23  Yes [name], MD  spironolactone (ALDACTONE) 100 MG tablet Take 1 tablet (100 mg total) by mouth daily. 05/17/23  Yes [name], MD  triamcinolone cream (KENALOG) 0.1 % Apply 1 Application topically 2 (two) times daily. 08/12/23  Yes [name], MD  Varenicline Tartrate, Starter, (CHANTIX STARTING MONTH PAK) 0.5 MG X 11 & 1 MG X 42 TBPK Take as directed 04/19/23  Yes [name], MD    Family History Family History  Problem Relation Age of Onset   Hypertension Mother    Heart failure Mother    Diabetes Maternal Grandmother    CAD Father 43       Heart attack and Stroke    Social History Social History   Tobacco Use   Smoking status: Every Day    Current packs/day: 0.25    Types: Cigarettes  Smokeless tobacco: Never   Tobacco comments:    5 cigs a day  Vaping Use   Vaping status: Never Used  Substance Use Topics   Alcohol use: Yes    Comment: Occassionally.   Drug use: Never     Allergies   Lisinopril   Review of Systems Review of Systems  Constitutional:  Negative for chills and fever.  HENT:  Positive for dental problem. Negative for facial swelling.   Eyes:  Negative for discharge and redness.  Respiratory:  Negative for shortness of breath.   Gastrointestinal:  Negative for nausea and vomiting.  Neurological:  Negative for numbness.     Physical Exam Triage Vital Signs ED Triage Vitals  Encounter Vitals Group     BP 09/05/23 0909 134/82     Systolic BP Percentile  --      Diastolic BP Percentile --      Pulse Rate 09/05/23 0909 83     Resp 09/05/23 0909 (!) 22     Temp 09/05/23 0909 98.1 F (36.7 C)     Temp Source 09/05/23 0909 Oral     SpO2 09/05/23 0909 96 %     Weight 09/05/23 0906 (!) 390 lb (176.9 kg)     Height 09/05/23 0906 6\' 2"  (1.88 m)     Head Circumference --      Peak Flow --      Pain Score 09/05/23 0906 8     Pain Loc --      Pain Education --      Exclude from Growth Chart --    No data found.  Updated Vital Signs BP 134/82 (BP Location: Left Arm)   Pulse 83   Temp 98.1 F (36.7 C) (Oral)   Resp (!) 22   Ht 6\' 2"  (1.88 m)   Wt (!) 390 lb (176.9 kg)   SpO2 96%   BMI 50.07 kg/m   Visual Acuity Right Eye Distance:   Left Eye Distance:   Bilateral Distance:    Right Eye Near:   Left Eye Near:    Bilateral Near:     Physical Exam Vitals and nursing note reviewed.  Constitutional:      General: He is not in acute distress.    Appearance: Normal appearance. He is not ill-appearing.  HENT:     Head: Normocephalic and atraumatic.     Nose: Nose normal. No congestion or rhinorrhea.     Mouth/Throat:     Mouth: Mucous membranes are moist.     Comments: Multiple caries noted, diffuse gingival swelling to right upper molars Eyes:     Conjunctiva/sclera: Conjunctivae normal.  Cardiovascular:     Rate and Rhythm: Normal rate.  Pulmonary:     Effort: Pulmonary effort is normal. No respiratory distress.  Neurological:     Mental Status: He is alert.  Psychiatric:        Mood and Affect: Mood normal.        Behavior: Behavior normal.        Thought Content: Thought content normal.      UC Treatments / Results  Labs (all labs ordered are listed, but only abnormal results are displayed) Labs Reviewed - No data to display  EKG   Radiology No results found.  Procedures Procedures (including critical care time)  Medications Ordered in UC Medications - No data to display  Initial Impression /  Assessment and Plan / UC Course  I have reviewed the triage vital signs  and the nursing notes.  Pertinent labs & imaging results that were available during my care of the patient were reviewed by me and considered in my medical decision making (see chart for details).     Will treat with amoxicillin to cover possible abscess and advised ibuprofen if needed for pain.  Discussed follow-up with dentistry as soon as possible.  Patient expresses understanding.  Encouraged follow-up with any further concerns.  Final Clinical Impressions(s) / UC Diagnoses   Final diagnoses:  Pain, dental   Discharge Instructions   None    ED Prescriptions     Medication Sig Dispense Auth. Provider   amoxicillin (AMOXIL) 500 MG capsule Take 1 capsule (500 mg total) by mouth 3 (three) times daily. 21 capsule Tomi Bamberger, PA-C      PDMP not reviewed this encounter.   Tomi Bamberger, PA-C 09/05/23 [phone]

## 2023-09-05 NOTE — ED Triage Notes (Signed)
"  I am here for dental pain in the top right part of my mouth starting 2 days ago". No established dentist (currently insured). No fever known.

## 2023-10-11 ENCOUNTER — Other Ambulatory Visit: Payer: Self-pay

## 2023-10-21 ENCOUNTER — Other Ambulatory Visit: Payer: Self-pay

## 2023-10-29 ENCOUNTER — Other Ambulatory Visit: Payer: Self-pay

## 2023-10-29 ENCOUNTER — Encounter (HOSPITAL_COMMUNITY): Payer: Self-pay | Admitting: *Deleted

## 2023-10-29 ENCOUNTER — Ambulatory Visit (HOSPITAL_COMMUNITY)
Admission: EM | Admit: 2023-10-29 | Discharge: 2023-10-29 | Disposition: A | Discharge status: Home / Self Care | Payer: BC Managed Care – PPO | Attending: Family Medicine | Admitting: Family Medicine

## 2023-10-29 DIAGNOSIS — R0789 Other chest pain: Secondary | ICD-10-CM

## 2023-10-29 DIAGNOSIS — R0682 Tachypnea, not elsewhere classified: Secondary | ICD-10-CM | POA: Diagnosis not present

## 2023-10-29 DIAGNOSIS — R0602 Shortness of breath: Secondary | ICD-10-CM

## 2023-10-29 NOTE — Discharge Instructions (Signed)
Please go directly to the ER for further evaluation and management of the shortness of breath.

## 2023-10-29 NOTE — ED Provider Notes (Signed)
[facility]    CSN: [id number] Arrival date & time: 10/29/23  0830      History   Chief Complaint Chief Complaint  Patient presents with   Shortness of Breath    HPI Richard Hale is a 38 y.o. male.   Patient presents today with shortness of breath, chest tightness with coughing, cough, and nasal drainage ongoing for the past 24 hours or so.  Denies fever, body aches or chills, or constant chest pain.  Reports symptoms began while at work yesterday, without performing any strenuous activity.  Reports he did miss a dose of Lasix yesterday, but has taken some today.  Reports "I think this is just a cold."    Past Medical History:  Diagnosis Date   CHF (congestive heart failure) (HCC)    Hypertension    Morbid obesity (HCC)    Obstructive sleep apnea    CPAP   Tobacco use 04/06/2019    Patient Active Problem List   Diagnosis Date Noted   Elevated troponin 07/02/2022   Influenza vaccine refused 11/21/2020   COVID-19 vaccination declined 11/21/2020   CHF (congestive heart failure), NYHA class II, chronic, systolic (HCC) 11/21/2020   Tobacco dependence 11/21/2020   Cardiomyopathy (HCC) 08/28/2019   Chest pain of uncertain etiology 08/28/2019   Acute coronary syndrome (HCC) 04/06/2019   Tobacco abuse 04/06/2019   Hypokalemia 04/06/2019   Hypertensive urgency 12/14/2018   Essential hypertension 09/05/2018   Obstructive sleep apnea 09/05/2018   Class 3 severe obesity due to excess calories with serious comorbidity and body mass index (BMI) of 50.0 to 59.9 in adult [facility]) 09/05/2018   Prediabetes 09/05/2018    Past Surgical History:  Procedure Laterality Date   NOSE SURGERY         Home Medications    Prior to Admission medications   Medication Sig Start Date End Date Taking? Authorizing Provider  amLODipine (NORVASC) 10 MG tablet Take 1 tablet (10 mg total) by mouth daily. 05/17/23  Yes [name], MD  carvedilol (COREG) 25 MG tablet Take 1  tablet (25 mg total) by mouth 2 (two) times daily with a meal. 05/17/23  Yes [name], MD  empagliflozin (JARDIANCE) 10 MG TABS tablet Take 1 tablet (10 mg total) by mouth daily. 04/19/23  Yes [name], MD  furosemide (LASIX) 80 MG tablet Take 1 tablet (80 mg total) by mouth daily. 05/17/23  Yes [name], MD  sacubitril-valsartan (ENTRESTO) 97-103 MG Take 1 tablet by mouth 2 (two) times daily. 05/17/23  Yes [name], MD  spironolactone (ALDACTONE) 100 MG tablet Take 1 tablet (100 mg total) by mouth daily. 05/17/23  Yes [name], MD  triamcinolone cream (KENALOG) 0.1 % Apply 1 Application topically 2 (two) times daily. 08/12/23  Yes [name], MD  amoxicillin (AMOXIL) 500 MG capsule Take 1 capsule (500 mg total) by mouth 3 (three) times daily. 09/05/23   Tomi Bamberger, PA-C  Varenicline Tartrate, Starter, (CHANTIX STARTING MONTH PAK) 0.5 MG X 11 & 1 MG X 42 TBPK Take as directed 04/19/23   [name], MD    Family History Family History  Problem Relation Age of Onset   Hypertension Mother    Heart failure Mother    Diabetes Maternal Grandmother    CAD Father 101       Heart attack and Stroke    Social History Social History   Tobacco Use   Smoking status: Every  Day    Current packs/day: 0.25    Types: Cigarettes   Smokeless tobacco: Never   Tobacco comments:    5 cigs a day  Vaping Use   Vaping status: Never Used  Substance Use Topics   Alcohol use: Yes    Comment: Occassionally.   Drug use: Never     Allergies   Lisinopril   Review of Systems Review of Systems Per HPI  Physical Exam Triage Vital Signs ED Triage Vitals  Encounter Vitals Group     BP 10/29/23 0834 (!) 163/116     Systolic BP Percentile --      Diastolic BP Percentile --      Pulse Rate 10/29/23 0834 (!) 105     Resp 10/29/23 0834 (!) 22     Temp 10/29/23 0834 99 F (37.2 C)     Temp src --      SpO2 10/29/23 0834 96 %     Weight --       Height --      Head Circumference --      Peak Flow --      Pain Score 10/29/23 0838 0     Pain Loc --      Pain Education --      Exclude from Growth Chart --    No data found.  Updated Vital Signs BP (!) 163/116   Pulse (!) 105   Temp 99 F (37.2 C)   Resp (!) 22   SpO2 96%   Visual Acuity Right Eye Distance:   Left Eye Distance:   Bilateral Distance:    Right Eye Near:   Left Eye Near:    Bilateral Near:     Physical Exam Vitals and nursing note reviewed.  Constitutional:      General: He is not in acute distress.    Appearance: Normal appearance. He is not ill-appearing or toxic-appearing.  HENT:     Head: Normocephalic and atraumatic.     Right Ear: Tympanic membrane, ear canal and external ear normal.     Left Ear: Tympanic membrane, ear canal and external ear normal.     Nose: Congestion present. No rhinorrhea.     Mouth/Throat:     Pharynx: Posterior oropharyngeal erythema present.  Eyes:     General: No scleral icterus. Cardiovascular:     Rate and Rhythm: Regular rhythm. Tachycardia present.  Pulmonary:     Effort: Tachypnea present.     Breath sounds: Decreased breath sounds present.  Skin:    General: Skin is warm and dry.     Coloration: Skin is not jaundiced or pale.     Findings: No erythema or rash.  Neurological:     Mental Status: He is alert and oriented to person, place, and time.  Psychiatric:        Behavior: Behavior is cooperative.      UC Treatments / Results  Labs (all labs ordered are listed, but only abnormal results are displayed) Labs Reviewed - No data to display  EKG   Radiology No results found.  Procedures Procedures (including critical care time)  Medications Ordered in UC Medications - No data to display  Initial Impression / Assessment and Plan / UC Course  I have reviewed the triage vital signs and the nursing notes.  Pertinent labs & imaging results that were available during my care of the patient  were reviewed by me and considered in my medical decision making (see chart for  details).   In triage, patient is hypertensive, tachypneic, and tachycardic.  Breathing is audible from the hallway.  Patient is in no acute distress, although his breathing quickly.  1. Shortness of breath 2. Tachypnea 3. Chest tightness Given tachypnea, hypertension, tachycardia, increased work of breathing, I recommended further evaluation and management in the emergency room EKG today shows possible new right bundle branch block, no obvious ST segment or T wave abnormalities when compared with previous EKG Patient is agreeable to plan, declines transportation via EMS We discussed going straight to the ER and not delaying care, discussed risks of transporting via prep vehicle and delaying care of how he is currently presenting  The patient was given the opportunity to ask questions.  All questions answered to their satisfaction.  The patient is in agreement to this plan.    Final Clinical Impressions(s) / UC Diagnoses   Final diagnoses:  Shortness of breath  Tachypnea  Chest tightness     Discharge Instructions      Please go directly to the ER for further evaluation and management of the shortness of breath.    ED Prescriptions   None    PDMP not reviewed this encounter.   Valentino Nose, NP 10/29/23 [phone]

## 2023-10-29 NOTE — ED Triage Notes (Signed)
PT reports SHOB started last night after work. Pt has a HX of CHF and did not take Lasix yesterday. Pt BP elevated on arrival 163/116.

## 2023-11-04 ENCOUNTER — Other Ambulatory Visit: Payer: Self-pay

## 2023-12-17 ENCOUNTER — Ambulatory Visit: Payer: BC Managed Care – PPO | Admitting: Internal Medicine

## 2023-12-21 DIAGNOSIS — H35033 Hypertensive retinopathy, bilateral: Secondary | ICD-10-CM | POA: Diagnosis not present

## 2023-12-21 DIAGNOSIS — H0288B Meibomian gland dysfunction left eye, upper and lower eyelids: Secondary | ICD-10-CM | POA: Diagnosis not present

## 2023-12-21 DIAGNOSIS — H0288A Meibomian gland dysfunction right eye, upper and lower eyelids: Secondary | ICD-10-CM | POA: Diagnosis not present

## 2023-12-21 DIAGNOSIS — H04123 Dry eye syndrome of bilateral lacrimal glands: Secondary | ICD-10-CM | POA: Diagnosis not present

## 2023-12-31 ENCOUNTER — Other Ambulatory Visit (HOSPITAL_COMMUNITY): Payer: Self-pay

## 2024-01-01 IMAGING — XA DG INJECT/[PERSON_NAME] INC NEEDLE/CATH/PLC EPI/CERV/THOR W/IMG
2 series · 2 of 2 positions shown · non-contrast
Comparison: none

CLINICAL DATA: Cervical spondylosis without myelopathy. Pain in the
neck, shoulders, and right upper extremity.

[Series 2: ortho standard · 1 of 1 slices shown (1 of 2)]
[im 1/1]
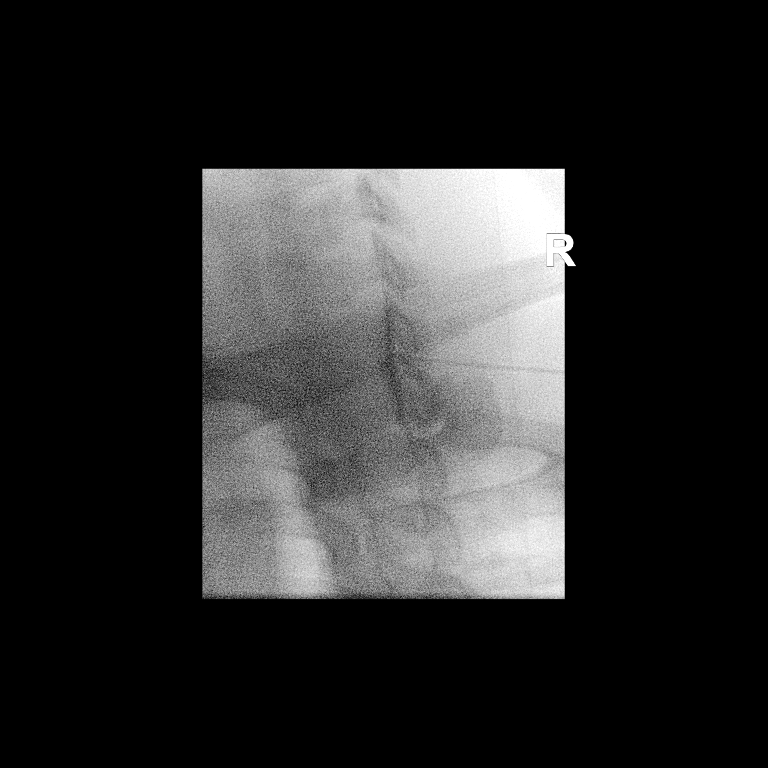

[Series 3: ortho standard · 1 of 1 slices shown (2 of 2)]
[im 1/1]
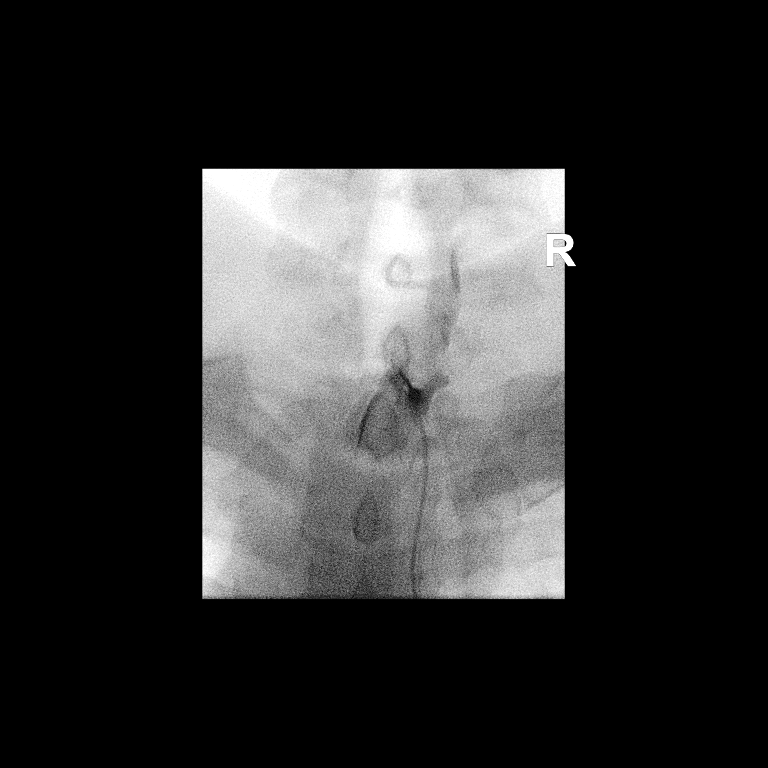

[2 of 2 positions shown; findings below may reference images not displayed]

FLUOROSCOPY:
Radiation Exposure Index (as provided by the fluoroscopic device):
7.50 mGy Kerma

PROCEDURE:
The procedure, risks, benefits, and alternatives were explained to
the patient. Questions regarding the procedure were encouraged and
answered. The patient understands and consents to the procedure.

CERVICAL EPIDURAL INJECTION

An interlaminar approach was performed on the right at C7-T1. A
inch 20 gauge epidural needle was advanced using loss-of-resistance
technique.

DIAGNOSTIC EPIDURAL INJECTION

Injection of Isovue-M 300 initially demonstrated spread in the soft
tissues dorsal to the ligament. The needle was advanced further
until a second loss of resistance was achieved, and contrast
injection at this location showed a good epidural pattern with
spread above and below the level of needle placement, primarily on
the right. No vascular opacification is seen.

THERAPEUTIC EPIDURAL INJECTION

1.5 ml of Kenalog 40 mixed with 2 ml of normal saline were then
instilled. The procedure was well-tolerated, and the patient was
discharged thirty minutes following the injection in good condition.
IMPRESSION: Technically successful interlaminar epidural injection on the right
at C7-T1.

## 2024-01-07 ENCOUNTER — Other Ambulatory Visit (HOSPITAL_COMMUNITY): Payer: Self-pay

## 2024-01-09 NOTE — Progress Notes (Unsigned)
  Cardiology Office Note:   Date:  01/12/2024  ID:  Richard Hale, DOB [birth year], MRN [id number] PCP: [name], MD  [facility] Providers Cardiologist:  Rollene Rotunda, MD {  History of Present Illness:   Richard Hale is a 38 y.o. male who presents for evaluation of chest pain.   He had an echo in June with an EF of 45%.   He was hospitalized in March 2020 with hypertensive urgency due to running out of his medication.  EF was 20 - 25%.  He had uncontrolled HTN.  Trop elevation was thought to be related to demand ischemia.   He has not been adherent with office visits routinely.    He denies any symptoms.  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 The patient denies any new symptoms such as chest discomfort, neck or arm discomfort. There has been no new shortness of breath, PND or orthopnea. There have been no reported palpitations, presyncope or syncope.  He forgets to take his medicines and really has only been taking his carvedilol once a day and his Entresto once a day.   ROS: As stated in the HPI and negative for all other systems.  Studies Reviewed:    EKG:   NA  Risk Assessment/Calculations:          Physical Exam:   VS:  BP (!) 150/106   Pulse 93   Ht 6\' 2"  (1.88 m)   Wt (!) 428 lb (194.1 kg)   SpO2 96%   BMI 54.95 kg/m    Wt Readings from Last 3 Encounters:  01/12/24 (!) 428 lb (194.1 kg)  09/05/23 (!) 390 lb (176.9 kg)  05/13/23 (!) 412 lb 3.2 oz (187 kg)     GEN: Well nourished, well developed in no acute distress NECK: No JVD; No carotid bruits CARDIAC: RRR, no murmurs, rubs, gallops RESPIRATORY:  Clear to auscultation without rales, wheezing or rhonchi  ABDOMEN: Soft, non-tender, non-distended EXTREMITIES:  No edema; No deformity   ASSESSMENT AND PLAN:   CHRONIC SYSTOLIC HF: He seems to be euvolemic.  He is not taking his meds as prescribed.  Him to change his carvedilol to metoprolol 200 mg XL once  daily.  We talked about strategies to remind him to take his Entresto twice a day.  I think his cardiomyopathy is related to poorly controlled blood pressure.  We talked about an MRI in the past.  In the future when we do not have the tracer shortage of probabl order PYP scan.  For now I need med and office compliance and we talked about this.    MORBID OBESITY:   I am going to set him up with our Pharm.D. to see if he can qualify for a GLP-1 receptor agonist.   TOBACCO ABUSE :   He tried Chantix.  It did not do any good.  We talked again about stopping smoking.  HTN:    This is being managed in the context of treating his CHF    SLEEP APNEA:   Unfortunately he is not able to use CPAP.  He sleeps on his stomach.  He needs weight loss.     Follow up with APP in six months.   Signed, Rollene Rotunda, MD  He seems to be euvolemic.

## 2024-01-10 ENCOUNTER — Other Ambulatory Visit: Payer: Self-pay

## 2024-01-12 ENCOUNTER — Other Ambulatory Visit (HOSPITAL_COMMUNITY): Payer: Self-pay

## 2024-01-12 ENCOUNTER — Other Ambulatory Visit: Payer: Self-pay

## 2024-01-12 ENCOUNTER — Ambulatory Visit: Payer: BC Managed Care – PPO | Attending: Cardiology | Admitting: Cardiology

## 2024-01-12 ENCOUNTER — Encounter: Payer: Self-pay | Admitting: Cardiology

## 2024-01-12 VITALS — BP 150/106 | HR 93 | Ht 74.0 in | Wt >= 6400 oz

## 2024-01-12 DIAGNOSIS — I5021 Acute systolic (congestive) heart failure: Secondary | ICD-10-CM

## 2024-01-12 DIAGNOSIS — I10 Essential (primary) hypertension: Secondary | ICD-10-CM | POA: Diagnosis not present

## 2024-01-12 DIAGNOSIS — G4730 Sleep apnea, unspecified: Secondary | ICD-10-CM | POA: Diagnosis not present

## 2024-01-12 MED ORDER — METOPROLOL SUCCINATE ER 100 MG PO TB24
200.0000 mg | ORAL_TABLET | Freq: Two times a day (BID) | ORAL | 3 refills | Status: DC
  Filled 2024-01-12: qty 120, 30d supply, fill #0

## 2024-01-12 MED ORDER — METOPROLOL SUCCINATE ER 100 MG PO TB24
200.0000 mg | ORAL_TABLET | Freq: Every day | ORAL | 3 refills | Status: DC
  Filled 2024-01-12: qty 180, 90d supply, fill #0
  Filled ????-??-??: fill #0

## 2024-01-12 NOTE — Patient Instructions (Addendum)
 Medication Instructions:  Stop carvedilol. Start metoprolol XL 100 mg take 2 tabs once per day. New script sent.  *If you need a refill on your cardiac medications before your next appointment, please call your pharmacy*  Follow-Up: At [facility], you and your health needs are our priority.  As part of our continuing mission to provide you with exceptional heart care, our providers are all part of one team.  This team includes your primary Cardiologist (physician) and Advanced Practice Providers or APPs (Physician Assistants and Nurse Practitioners) who all work together to provide you with the care you need, when you need it.  Your next appointment:   3 month(s)  Provider:   Bernadene Person, NP        We recommend signing up for the patient portal called "MyChart".  Sign up information is provided on this After Visit Summary.  MyChart is used to connect with patients for Virtual Visits (Telemedicine).  Patients are able to view lab/test results, encounter notes, upcoming appointments, etc.  Non-urgent messages can be sent to your provider as well.   To learn more about what you can do with MyChart, go to [email or web address].   Other Instructions       1st Floor: - Lobby - Registration  - Pharmacy  - Lab - Cafe  2nd Floor: - PV Lab - Diagnostic Testing (echo, CT, nuclear med)  3rd Floor: - Vacant  4th Floor: - TCTS (cardiothoracic surgery) - AFib Clinic - Structural Heart Clinic - Vascular Surgery  - Vascular Ultrasound  5th Floor: - HeartCare Cardiology (general and EP) - Clinical Pharmacy for coumadin, hypertension, lipid, weight-loss medications, and med management appointments    Valet parking services will be available as well.

## 2024-01-22 ENCOUNTER — Other Ambulatory Visit (HOSPITAL_COMMUNITY): Payer: Self-pay

## 2024-01-24 ENCOUNTER — Other Ambulatory Visit: Payer: Self-pay

## 2024-03-14 ENCOUNTER — Telehealth: Payer: Self-pay | Admitting: Pharmacist

## 2024-03-14 ENCOUNTER — Encounter: Payer: Self-pay | Admitting: Pharmacist

## 2024-03-14 ENCOUNTER — Other Ambulatory Visit (HOSPITAL_COMMUNITY): Payer: Self-pay

## 2024-03-14 ENCOUNTER — Telehealth: Payer: Self-pay | Admitting: Pharmacy Technician

## 2024-03-14 ENCOUNTER — Ambulatory Visit: Attending: Cardiology | Admitting: Pharmacist

## 2024-03-14 NOTE — Progress Notes (Signed)
 Patient ID: Richard GRUDZIEN                 DOB: 1986/05/05                    MRN: [id number]     HPI: Richard Hale is a 38 y.o. male patient referred to pharmacy clinic by Dr.Hochrein  to initiate GLP1-RA therapy. PMH is significant for ACS with with NICM , -2020,hypertension, hx of hypertensive emergency, CHF, pre-DM, OSA,  and obesity. Most recent BMI 54.93 kg/m .  Baseline weight and BMI: 390 lbs 50.05 kg/m  Current weight and BMI: 428 lbs 54.93 kg/m  Current meds that affect weight: none    Diet:  Breakfast: Fruit and protein shake Lunch: Typically skipped Dinner: Baked, broiled, or air-fried chicken; sides include rice, corn, green peas, cabbage, and occasionally [place] fries  Exercise: Engages in brisk walking for 30-45 minutes daily. Advised to incorporate resistance training 2-3 times per week to support overall fitness, muscle strength, and metabolic health.  Family History:  Relation Problem Comments  Mother (Alive) Heart failure   Hypertension     Father (Deceased) CAD (Age: 50) Heart attack and Stroke    Maternal Grandmother Diabetes      Social History:  Alcohol: 4 times pre year Smoking: 7 cig per day cutting down, In agreement to lower to 4 cig per day in next 4 weeks - tried Chantix  in the past it did not help Labs: Lab Results  Component Value Date   HGBA1C 5.9 (A) 04/14/2023    Wt Readings from Last 1 Encounters:  01/12/24 (!) 428 lb (194.1 kg)    BP Readings from Last 1 Encounters:  01/12/24 (!) 150/106   Pulse Readings from Last 1 Encounters:  01/12/24 93       Component Value Date/Time   CHOL 121 11/24/2021 1043   TRIG 83 11/24/2021 1043   HDL 54 11/24/2021 1043   CHOLHDL 2.2 11/24/2021 1043   CHOLHDL 3.1 04/07/2019 0856   VLDL 29 04/07/2019 0856   LDLCALC 51 11/24/2021 1043    Past Medical History:  Diagnosis Date   CHF (congestive heart failure) (HCC)    Hypertension    Morbid obesity (HCC)    Obstructive sleep apnea     CPAP   Tobacco use 04/06/2019    Current Outpatient Medications on File Prior to Visit  Medication Sig Dispense Refill   amLODipine  (NORVASC ) 10 MG tablet Take 1 tablet (10 mg total) by mouth daily. 90 tablet 1   amoxicillin  (AMOXIL ) 500 MG capsule Take 1 capsule (500 mg total) by mouth 3 (three) times daily. 21 capsule 0   empagliflozin  (JARDIANCE ) 10 MG TABS tablet Take 1 tablet (10 mg total) by mouth daily. 30 tablet 6   furosemide  (LASIX ) 80 MG tablet Take 1 tablet (80 mg total) by mouth daily. 90 tablet 1   metoprolol  succinate (TOPROL  XL) 100 MG 24 hr tablet Take 2 tablets (200 mg total) by mouth daily. 360 tablet 3   sacubitril -valsartan  (ENTRESTO ) 97-103 MG Take 1 tablet by mouth 2 (two) times daily. 60 tablet 6   spironolactone  (ALDACTONE ) 100 MG tablet Take 1 tablet (100 mg total) by mouth daily. 90 tablet 1   triamcinolone  cream (KENALOG ) 0.1 % Apply 1 Application topically 2 (two) times daily. 30 g 0   Varenicline  Tartrate, Starter, (CHANTIX  STARTING MONTH PAK) 0.5 MG X 11 & 1 MG X 42 TBPK Take as directed (Patient not taking:  Reported on 01/12/2024) 53 each 0   No current facility-administered medications on file prior to visit.    Allergies  Allergen Reactions   Lisinopril Cough     Assessment/Plan:  1. Weight loss -  Patient has not achieved >=5% body weight reduction despite 3-6 months of comprehensive lifestyle modifications. Pharmacotherapy is appropriate as an adjunct.Given the presence of severe obstructive sleep apnea (OSA), Zepbound (tirzepatide) may be eligible for insurance coverage. Coverage assessment will be initiated.Confirmed no personal or family history of medullary thyroid carcinoma (MTC) or Multiple Endocrine Neoplasia syndrome type 2 (MEN 2). Injection technique was reviewed during today's visit.  Patient Counseling:  Reviewed potential side effects: nausea, diarrhea, dyspepsia, decreased appetite, and fatigue Advised on strategies to reduce side  effects, including gradual dose titration and reduced meal size Instructed to contact clinic if experiencing intolerable side effects, dehydration, abdominal pain, or dizziness Patient will continue with dietary modifications and aim for at least 150 minutes of moderate-intensity exercise per week.  Follow-Up Plan:  Follow-up in 1-2 days to review Zepbound coverage status If initiated, phone follow-ups every 4 weeks for dose titration until reaching effective dose and weight target  Nickola Baron, Pharm.D [facility] [address], [place], [place] [address] Phone: [phone]; Fax: [phone] !

## 2024-03-14 NOTE — Patient Instructions (Signed)
 GLP1 Agonist Titration Plan:  Will plan to follow the titration plan as below, pending patient is tolerating each dose before increasing to the next. Can slow titration if needed for tolerability.   Zepbound    Treatment week Recommended Zepbound  (tirzepatide ) dosage for weight loss and sleep apnea  Weeks 1-4  2.5 mg once a week  Weeks 5-8    5 mg once a week   Week 9 and beyond For weight loss: Can raise dosage again, if needed, by no more than 2.5 mg at once. Dosage increases should be separated by at least 4 weeks.  For sleep apnea: Raise dosage to 7.5 mg once a week. At week 13, raise it again to 10 mg once a week. Starting at week 17, your dose can be raised again, if needed, by no more than 2.5 mg at once. Dosage increases should be separated by at least 4 weeks.   Maximum dosage 15 mg once a week  Recommended maintenance dosage for weight loss  5 mg, 10 mg, or 15 mg once a week  Recommended maintenance dosage for sleep apnea 10 mg or 15 mg once a week

## 2024-03-14 NOTE — Telephone Encounter (Signed)
 Pharmacy Patient Advocate Encounter   Received notification from Pt Calls Messages that prior authorization for Zepbound is required/requested.   Insurance verification completed.   The patient is insured through [employer] [place]  .   Per test claim: PA required; PA submitted to above mentioned insurance via CoverMyMeds Key/confirmation #/EOC [id number] Status is pending

## 2024-03-14 NOTE — Telephone Encounter (Signed)
PA request has been Submitted.

## 2024-03-15 ENCOUNTER — Other Ambulatory Visit (HOSPITAL_COMMUNITY): Payer: Self-pay

## 2024-03-15 MED ORDER — ZEPBOUND 2.5 MG/0.5ML ~~LOC~~ SOAJ
2.5000 mg | SUBCUTANEOUS | 0 refills | Status: DC
  Filled 2024-03-15: qty 2, 28d supply, fill #0

## 2024-03-15 NOTE — Telephone Encounter (Signed)
 Pharmacy Patient Advocate Encounter  Received notification from [employer] [place]  that Prior Authorization for zepbound has been APPROVED from 03/14/24 to 03/14/25. Ran test claim, Copay is $24.99- one month. This test claim was processed through [facility]- copay amounts may vary at other pharmacies due to pharmacy/plan contracts, or as the patient moves through the different stages of their insurance plan.   PA #/Case ID/Reference #: [id number]

## 2024-03-15 NOTE — Addendum Note (Signed)
 Addended by: Sharanya Templin K on: 03/15/2024 11:08 AM   Modules accepted: Orders

## 2024-04-11 ENCOUNTER — Other Ambulatory Visit: Payer: Self-pay

## 2024-04-11 ENCOUNTER — Other Ambulatory Visit: Payer: Self-pay | Admitting: Cardiology

## 2024-04-11 NOTE — Progress Notes (Signed)
 Office Visit    Patient Name: Richard Hale Date of Encounter: 04/12/2024  Primary Care Provider:  Vicci Barnie NOVAK, MD Primary Cardiologist:  Lynwood Schilling, MD  Chief Complaint    38 year old male with a history of cardiomyopathy, chronic systolic heart failure, hypertension, OSA, obesity, and tobacco use who presents for follow-up related to heart failure.   Past Medical History    Past Medical History:  Diagnosis Date   CHF (congestive heart failure) (HCC)    Hypertension    Morbid obesity (HCC)    Obstructive sleep apnea    CPAP   Tobacco use 04/06/2019   Past Surgical History:  Procedure Laterality Date   NOSE SURGERY      Allergies  Allergies  Allergen Reactions   Lisinopril Cough     Labs/Other Studies Reviewed    The following studies were reviewed today:  Cardiac Studies & Procedures   ______________________________________________________________________________________________     ECHOCARDIOGRAM  ECHOCARDIOGRAM COMPLETE 07/02/2022  Narrative ECHOCARDIOGRAM REPORT    Patient Name:   Richard Hale Date of Exam: 07/02/2022 Medical Rec #:  [id number]         Height:       74.0 in Accession #:    [id number]        Weight:       402.8 lb Date of Birth:  [birth year]         BSA:          2.927 m Patient Age:    36 years          BP:           154/116 mmHg Patient Gender: M                 HR:           81 bpm. Exam Location:  Inpatient  Procedure: 2D Echo and Intracardiac Opacification Agent  Indications:    CHF  History:        Patient has prior history of Echocardiogram examinations, most recent 04/01/2019. Cardiomyopathy and CHF, Signs/Symptoms:Chest Pain; Risk Factors:Current Smoker and Hypertension.  Sonographer:    Sari Candy Referring Phys: [id number] EVA NOVAK PORE   Sonographer Comments: Technically difficult study due to poor echo windows, no subcostal window and patient is obese. Image acquisition challenging due to  patient body habitus. IMPRESSIONS   1. Left ventricular ejection fraction, by estimation, is 20 to 25%. The left ventricle has severely decreased function. The left ventricle demonstrates global hypokinesis. The left ventricular internal cavity size was moderately dilated. There is severe concentric left ventricular hypertrophy. Indeterminate diastolic filling due to E-A fusion. 2. Right ventricular systolic function is normal. The right ventricular size is normal. Tricuspid regurgitation signal is inadequate for assessing PA pressure. 3. A small pericardial effusion is present. The pericardial effusion is circumferential. 4. The mitral valve is grossly normal. No evidence of mitral valve regurgitation. No evidence of mitral stenosis. 5. The aortic valve is tricuspid. Aortic valve regurgitation is not visualized. No aortic stenosis is present. 6. The inferior vena cava is normal in size with greater than 50% respiratory variability, suggesting right atrial pressure of 3 mmHg.  Comparison(s): Changes from prior study are noted. The left ventricular function is significantly worse.  FINDINGS Left Ventricle: Left ventricular ejection fraction, by estimation, is 20 to 25%. The left ventricle has severely decreased function. The left ventricle demonstrates global hypokinesis. The left ventricular internal cavity size was moderately dilated. There is severe concentric  left ventricular hypertrophy. Indeterminate diastolic filling due to E-A fusion.  Right Ventricle: The right ventricular size is normal. No increase in right ventricular wall thickness. Right ventricular systolic function is normal. Tricuspid regurgitation signal is inadequate for assessing PA pressure.  Left Atrium: Left atrial size was normal in size.  Right Atrium: Right atrial size was not well visualized.  Pericardium: A small pericardial effusion is present. The pericardial effusion is circumferential.  Mitral Valve: The  mitral valve is grossly normal. No evidence of mitral valve regurgitation. No evidence of mitral valve stenosis.  Tricuspid Valve: The tricuspid valve is grossly normal. Tricuspid valve regurgitation is not demonstrated. No evidence of tricuspid stenosis.  Aortic Valve: The aortic valve is tricuspid. Aortic valve regurgitation is not visualized. No aortic stenosis is present. Aortic valve mean gradient measures 3.0 mmHg. Aortic valve peak gradient measures 5.6 mmHg. Aortic valve area, by VTI measures 1.97 cm.  Pulmonic Valve: The pulmonic valve was grossly normal. Pulmonic valve regurgitation is not visualized. No evidence of pulmonic stenosis.  Aorta: The aortic root and ascending aorta are structurally normal, with no evidence of dilitation.  Venous: The inferior vena cava is normal in size with greater than 50% respiratory variability, suggesting right atrial pressure of 3 mmHg.  IAS/Shunts: The interatrial septum was not well visualized.   LEFT VENTRICLE PLAX 2D LVIDd:         6.40 cm      Diastology LVIDs:         6.00 cm      LV e' medial:    9.90 cm/s LV PW:         1.70 cm      LV E/e' medial:  8.0 LV IVS:        1.80 cm      LV e' lateral:   4.22 cm/s LVOT diam:     2.40 cm      LV E/e' lateral: 18.7 LV SV:         47 LV SV Index:   16 LVOT Area:     4.52 cm  LV Volumes (MOD) LV vol d, MOD A2C: 239.0 ml LV vol d, MOD A4C: 341.0 ml LV vol s, MOD A2C: 189.0 ml LV vol s, MOD A4C: 257.0 ml LV SV MOD A2C:     50.0 ml LV SV MOD A4C:     341.0 ml LV SV MOD BP:      70.8 ml  RIGHT VENTRICLE RV Basal diam:  4.00 cm RV Mid diam:    3.50 cm RV S prime:     14.10 cm/s TAPSE (M-mode): 2.3 cm  LEFT ATRIUM             Index        RIGHT ATRIUM           Index LA diam:        5.70 cm 1.95 cm/m   RA Area:     13.70 cm LA Vol (A2C):   76.6 ml 26.17 ml/m  RA Volume:   32.50 ml  11.10 ml/m LA Vol (A4C):   90.3 ml 30.85 ml/m LA Biplane Vol: 86.7 ml 29.62 ml/m AORTIC VALVE                     PULMONIC VALVE AV Area (Vmax):    2.22 cm     PV Vmax:       1.26 m/s AV Area (Vmean):   2.24 cm  PV Peak grad:  6.4 mmHg AV Area (VTI):     1.97 cm AV Vmax:           118.00 cm/s AV Vmean:          78.700 cm/s AV VTI:            0.239 m AV Peak Grad:      5.6 mmHg AV Mean Grad:      3.0 mmHg LVOT Vmax:         57.80 cm/s LVOT Vmean:        39.000 cm/s LVOT VTI:          0.104 m LVOT/AV VTI ratio: 0.44  AORTA Ao Root diam: 3.70 cm Ao Asc diam:  3.50 cm  MITRAL VALVE MV Area (PHT): 4.41 cm    SHUNTS MV Decel Time: 172 msec    Systemic VTI:  0.10 m MR Peak grad: 27.1 mmHg    Systemic Diam: 2.40 cm MR Vmax:      260.50 cm/s MV E velocity: 78.80 cm/s MV A velocity: 57.80 cm/s MV E/A ratio:  1.36  Darryle Decent MD Electronically signed by Darryle Decent MD Signature Date/Time: 07/02/2022/11:26:09 AM    Final          ______________________________________________________________________________________________     Recent Labs: 04/14/2023: BNP CANCELED; BUN 15; Creatinine, Ser 1.00; Hemoglobin 13.6; Platelets 280; Potassium 3.8; Sodium 144  Recent Lipid Panel    Component Value Date/Time   CHOL 121 11/24/2021 1043   TRIG 83 11/24/2021 1043   HDL 54 11/24/2021 1043   CHOLHDL 2.2 11/24/2021 1043   CHOLHDL 3.1 04/07/2019 0856   VLDL 29 04/07/2019 0856   LDLCALC 51 11/24/2021 1043    History of Present Illness    38 year old male with the above past medical history including cardiomyopathy, chronic systolic heart failure, hypertension, OSA, obesity, and tobacco use.   Prior echocardiogram in June 2020 showed EF 45%, moderately increased LV wall thickness, LV global hypokinesis without RWMA, normal RV systolic function, no significant valvular abnormalities.  He was hospitalized in 06/2022 with failure of acute on chronic systolic heart failure.  Repeat echocardiogram in 06/2022 showed EF 20 to 25%, severely decreased LV function, severe concentric  LVH, normal RV function, small circumferential pericardial effusion, no significant valvular abnormalities. It was thought that his cardiomyopathy was likely related to uncontrolled hypertension, however, outpatient cardiac MRI was recommended for infiltrative disease. Additionally, there was mention of possible cardiac PET stress test in future to rule out ischemic source.  GDMT was optimized to include Entresto , spironolactone , Jardiance , carvedilol , and Lasix .  He was last seen in the office on 01/12/2024.  He was not taking his GDMT consistently.  It was again noted that his cardiomyopathy was likely related to poorly controlled blood pressure.  PYP scan was recommended upon resolution of tracer shortage.  Adherence to his medication regimen was encouraged.  He was referred to [facility] for weight loss management and was started on Zepbound .  He presents today for follow-up.  Since his last visit he has done well from a cardiac standpoint.  He is taking his medication as prescribed, BP has been well-controlled.  He is tolerating Zepbound  and has lost about 20 pounds.  He is walking for exercise. He is down to smoking 5 cigarettes a day and continues to work on quitting smoking. He denies any chest pain, dyspnea, edema, PND, orthopnea, weight gain.  Overall, he reports feeling well.      Home Medications  Current Outpatient Medications  Medication Sig Dispense Refill   amLODipine  (NORVASC ) 10 MG tablet Take 1 tablet (10 mg total) by mouth daily. 90 tablet 1   amoxicillin  (AMOXIL ) 500 MG capsule Take 1 capsule (500 mg total) by mouth 3 (three) times daily. 21 capsule 0   empagliflozin  (JARDIANCE ) 10 MG TABS tablet Take 1 tablet (10 mg total) by mouth daily. 30 tablet 6   furosemide  (LASIX ) 80 MG tablet Take 1 tablet (80 mg total) by mouth daily. 90 tablet 1   metoprolol  succinate (TOPROL  XL) 100 MG 24 hr tablet Take 2 tablets (200 mg total) by mouth daily. 360 tablet 3   sacubitril -valsartan  (ENTRESTO )  97-103 MG Take 1 tablet by mouth 2 (two) times daily. 60 tablet 6   spironolactone  (ALDACTONE ) 100 MG tablet Take 1 tablet (100 mg total) by mouth daily. 90 tablet 1   tirzepatide  (ZEPBOUND ) 2.5 MG/0.5ML Pen Inject 2.5 mg into the skin once a week. 2 mL 0   triamcinolone  cream (KENALOG ) 0.1 % Apply 1 Application topically 2 (two) times daily. 30 g 0   Varenicline  Tartrate, Starter, (CHANTIX  STARTING MONTH PAK) 0.5 MG X 11 & 1 MG X 42 TBPK Take as directed 53 each 0   No current facility-administered medications for this visit.     Review of Systems    He denies chest pain, palpitations, dyspnea, pnd, orthopnea, n, v, dizziness, syncope, edema, weight gain, or early satiety. All other systems reviewed and are otherwise negative except as noted above.   Physical Exam    VS:  BP 132/86   Pulse 91   Ht 6' 2 (1.88 m)   Wt (!) 408 lb 3.2 oz (185.2 kg)   SpO2 97%   BMI 52.41 kg/m  GEN: Well nourished, well developed, in no acute distress. HEENT: normal. Neck: Supple, no JVD, carotid bruits, or masses. Cardiac: RRR, no murmurs, rubs, or gallops. No clubbing, cyanosis, edema.  Radials/DP/PT 2+ and equal bilaterally.  Respiratory:  Respirations regular and unlabored, clear to auscultation bilaterally. GI: Soft, nontender, nondistended, BS + x 4. MS: no deformity or atrophy. Skin: warm and dry, no rash. Neuro:  Strength and sensation are intact. Psych: Normal affect.  Accessory Clinical Findings    ECG personally reviewed by me today -    - no EKG in office today.    Lab Results  Component Value Date   WBC 7.2 04/14/2023   HGB 13.6 04/14/2023   HCT 42.2 04/14/2023   MCV 89 04/14/2023   PLT 280 04/14/2023   Lab Results  Component Value Date   CREATININE 1.00 04/14/2023   BUN 15 04/14/2023   NA 144 04/14/2023   K 3.8 04/14/2023   CL 106 04/14/2023   CO2 26 07/03/2022   Lab Results  Component Value Date   ALT 25 07/02/2022   AST 21 04/14/2023   ALKPHOS 92 04/14/2023    BILITOT 0.4 04/14/2023   Lab Results  Component Value Date   CHOL 121 11/24/2021   HDL 54 11/24/2021   LDLCALC 51 11/24/2021   TRIG 83 11/24/2021   CHOLHDL 2.2 11/24/2021    Lab Results  Component Value Date   HGBA1C 5.9 (A) 04/14/2023    Assessment & Plan   1. Cardiomyopathy/chronic systolic heart failure: Prior EF 45% in 2020. Repeat echo in 06/2022 showed EF 20 to 25%, severely decreased LV function, severe concentric LVH, normal RV function, small circumferential pericardial effusion, no significant valvular abnormalities. It was noted that his  cardiomyopathy was likely related to uncontrolled hypertension. Per Dr. Denver last note, PYP scan was recommended upon resolution of tracer shortage. Stable with no anginal symptoms. Euvolemic and well compensated on exam. He has been taking his medication as prescribed. Encouraged ongoing adherence. Will check BMET today.  I will reach out to Dr. Lavona to see whether or not there is still a tracer shortage, and plans for additional testing.  Continue metoprolol , amlodipine , Entresto , Jardiance , spironolactone , and Lasix .  2. Hypertension: BP well controlled. Continue current antihypertensive regimen.   3. Obesity: He has lost nearly 20 pounds since starting Zepbound . Will increase Zepbound  to 5 mg weekly.  4. OSA: Nonadherent to CPAP. Encouraged follow-up with PCP.  5. Tobacco use: He continues to smoke though he has cut back to 5 cigarettes a day. Full cessation advised.   6. Disposition: Follow-up in 3 to 4 months, sooner if needed      Damien JAYSON Braver, NP 04/12/2024, 9:22 AM

## 2024-04-12 ENCOUNTER — Other Ambulatory Visit (HOSPITAL_COMMUNITY): Payer: Self-pay

## 2024-04-12 ENCOUNTER — Encounter: Payer: Self-pay | Admitting: Nurse Practitioner

## 2024-04-12 ENCOUNTER — Ambulatory Visit: Attending: Cardiology | Admitting: Nurse Practitioner

## 2024-04-12 VITALS — BP 132/86 | HR 91 | Ht 74.0 in | Wt >= 6400 oz

## 2024-04-12 DIAGNOSIS — I10 Essential (primary) hypertension: Secondary | ICD-10-CM

## 2024-04-12 DIAGNOSIS — I420 Dilated cardiomyopathy: Secondary | ICD-10-CM | POA: Diagnosis not present

## 2024-04-12 DIAGNOSIS — G4733 Obstructive sleep apnea (adult) (pediatric): Secondary | ICD-10-CM

## 2024-04-12 DIAGNOSIS — Z720 Tobacco use: Secondary | ICD-10-CM

## 2024-04-12 DIAGNOSIS — I5022 Chronic systolic (congestive) heart failure: Secondary | ICD-10-CM

## 2024-04-12 LAB — BASIC METABOLIC PANEL WITH GFR
BUN/Creatinine Ratio: 13 (ref 9–20)
BUN: 15 mg/dL (ref 6–20)
CO2: 20 mmol/L (ref 20–29)
Calcium: 9.8 mg/dL (ref 8.7–10.2)
Chloride: 103 mmol/L (ref 96–106)
Creatinine, Ser: 1.17 mg/dL (ref 0.76–1.27)
Glucose: 89 mg/dL (ref 70–99)
Potassium: 4.3 mmol/L (ref 3.5–5.2)
Sodium: 141 mmol/L (ref 134–144)
eGFR: 82 mL/min/{1.73_m2} (ref >59)

## 2024-04-12 MED ORDER — ZEPBOUND 5 MG/0.5ML ~~LOC~~ SOAJ
5.0000 mg | SUBCUTANEOUS | 2 refills | Status: DC
  Filled 2024-04-12: qty 2, 28d supply, fill #0
  Filled 2024-04-12: qty 4, 56d supply, fill #0
  Filled 2024-05-15: qty 2, 28d supply, fill #1
  Filled 2024-06-26: qty 2, 28d supply, fill #2
  Filled ????-??-??: fill #2

## 2024-04-12 NOTE — Patient Instructions (Addendum)
 Medication Instructions:  INCREASE ZEPBOUND  TO 5 MG/0.5 ML WEEKLY *If you need a refill on your cardiac medications before your next appointment, please call your pharmacy*  Lab Work: BMET TODAY If you have labs (blood work) drawn today and your tests are completely normal, you will receive your results only by: MyChart Message (if you have MyChart) OR A paper copy in the mail If you have any lab test that is abnormal or we need to change your treatment, we will call you to review the results.  Testing/Procedures: NONE  Follow-Up: At [facility], you and your health needs are our priority.  As part of our continuing mission to provide you with exceptional heart care, our providers are all part of one team.  This team includes your primary Cardiologist (physician) and Advanced Practice Providers or APPs (Physician Assistants and Nurse Practitioners) who all work together to provide you with the care you need, when you need it.  Your next appointment:   3-4 MONTHS  Provider:   Lynwood Schilling, MD or Damien Braver, NP

## 2024-04-15 ENCOUNTER — Encounter: Payer: Self-pay | Admitting: Nurse Practitioner

## 2024-04-17 ENCOUNTER — Ambulatory Visit: Payer: Self-pay | Admitting: Nurse Practitioner

## 2024-04-21 ENCOUNTER — Other Ambulatory Visit: Payer: Self-pay

## 2024-05-05 ENCOUNTER — Telehealth: Payer: Self-pay | Admitting: Pharmacist

## 2024-05-05 NOTE — Telephone Encounter (Signed)
 Call to titrate Zepbound dose. N/A LVM

## 2024-05-15 ENCOUNTER — Other Ambulatory Visit: Payer: Self-pay | Admitting: Internal Medicine

## 2024-05-15 DIAGNOSIS — I5022 Chronic systolic (congestive) heart failure: Secondary | ICD-10-CM

## 2024-05-15 NOTE — Telephone Encounter (Unsigned)
 Copied from CRM [id number]. Topic: Clinical - Medication Refill >> May 15, 2024 11:25 AM Antonio H wrote: Medication: furosemide  (LASIX ) 80 MG tablet tirzepatide  (ZEPBOUND ) 5 MG/0.5ML Pen  Has the patient contacted their pharmacy? No (Agent: If no, request that the patient contact the pharmacy for the refill. If patient does not wish to contact the pharmacy document the reason why and proceed with request.) (Agent: If yes, when and what did the pharmacy advise?)  This is the patient's preferred pharmacy:  [facility] [address], [address] [place] [address] Phone: [phone] Fax: [phone]  Is this the correct pharmacy for this prescription? Yes If no, delete pharmacy and type the correct one.   Has the prescription been filled recently? No  Is the patient out of the medication? Yes  Has the patient been seen for an appointment in the last year OR does the patient have an upcoming appointment? Yes  Can we respond through MyChart? Yes  Agent: Please be advised that Rx refills may take up to 3 business days. We ask that you follow-up with your pharmacy.

## 2024-05-16 NOTE — Telephone Encounter (Signed)
 Requested medications are due for refill today.  Zepbound  no,Lasix  yes  Requested medications are on the active medications list.  yes  Last refill. Zepbound  04/13/2023 2mL 2 rf,  Lasix  05/17/2023 #90 1 rf  Future visit scheduled.   yes  Notes to clinic.  Labs are expired for lasix , Zepbound  rx was signed by another provider.    Requested Prescriptions  Pending Prescriptions Disp Refills   furosemide  (LASIX ) 80 MG tablet 90 tablet 1    Sig: Take 1 tablet (80 mg total) by mouth daily.     Cardiovascular:  Diuretics - Loop Failed - 05/16/2024  2:38 PM      Failed - Mg Level in normal range and within 180 days    Magnesium   Date Value Ref Range Status  07/03/2022 2.4 1.7 - 2.4 mg/dL Final    Comment:    Performed at [facility], [address], [place], [place] [address]         Failed - Valid encounter within last 6 months    Recent Outpatient Visits           1 year ago Essential hypertension   [facility] Dept Of [place]. [facility] Fleeta Tonia Garnette LITTIE, RPH-CPP   1 year ago Essential hypertension   [facility] - A Dept Of [place]. [facility] Vicci Barnie NOVAK, MD   1 year ago Hospital discharge follow-up   [facility] - A Dept Of [place]. [facility] Vicci Barnie NOVAK, MD   2 years ago Essential hypertension   [facility] - [facility] Dept Of [place]. [facility] Fleeta Tonia Garnette LITTIE, RPH-CPP   2 years ago Essential hypertension   [facility] - A Dept Of . [facility] Vicci Barnie NOVAK, MD       Future Appointments             In 1 month Lavona Agent, MD [facility] at [facility] A Dept of [employer]. [facility], H&[facility]            Passed - K in normal range and within 180 days    Potassium  Date Value Ref Range Status  04/12/2024 4.3 3.5 - 5.2 mmol/L Final         Passed - Ca in  normal range and within 180 days    Calcium  Date Value Ref Range Status  04/12/2024 9.8 8.7 - 10.2 mg/dL Final   Calcium, Ion  Date Value Ref Range Status  04/23/2017 1.18 1.15 - 1.40 mmol/L Final         Passed - Na in normal range and within 180 days    Sodium  Date Value Ref Range Status  04/12/2024 141 134 - 144 mmol/L Final         Passed - Cr in normal range and within 180 days    Creatinine, Ser  Date Value Ref Range Status  04/12/2024 1.17 0.76 - 1.27 mg/dL Final         Passed - Cl in normal range and within 180 days    Chloride  Date Value Ref Range Status  04/12/2024 103 96 - 106 mmol/L Final         Passed - Last BP in normal range    BP Readings from Last 1 Encounters:  04/12/24  132/86          tirzepatide  (ZEPBOUND ) 5 MG/0.5ML Pen 2 mL 2    Sig: Inject 5 mg into the skin once a week.     Off-Protocol Failed - 05/16/2024  2:38 PM      Failed - Medication not assigned to a protocol, review manually.      Failed - Valid encounter within last 12 months    Recent Outpatient Visits           1 year ago Essential hypertension   [facility] - A Dept Of [place]. [facility] Fleeta Tonia Garnette LITTIE, RPH-CPP   1 year ago Essential hypertension   [facility] - [facility] Dept Of [place]. [facility] Vicci Barnie NOVAK, MD   1 year ago Hospital discharge follow-up   [facility] - A Dept Of [place]. [facility] Vicci Barnie NOVAK, MD   2 years ago Essential hypertension   [facility] - A Dept Of [place]. [facility] Fleeta Tonia Garnette LITTIE, RPH-CPP   2 years ago Essential hypertension   [facility] - A Dept Of [place]. [facility] Vicci Barnie NOVAK, MD       Future Appointments             In 1 month Lavona Agent, MD [facility] at [facility] A Dept of [employer]. [facility], H&V

## 2024-05-17 ENCOUNTER — Other Ambulatory Visit: Payer: Self-pay

## 2024-05-17 MED ORDER — FUROSEMIDE 80 MG PO TABS
80.0000 mg | ORAL_TABLET | Freq: Every day | ORAL | 1 refills | Status: DC
  Filled ????-??-??: fill #0

## 2024-05-18 ENCOUNTER — Other Ambulatory Visit: Payer: Self-pay

## 2024-06-10 ENCOUNTER — Other Ambulatory Visit: Payer: Self-pay | Admitting: Internal Medicine

## 2024-06-10 DIAGNOSIS — I10 Essential (primary) hypertension: Secondary | ICD-10-CM

## 2024-06-12 MED ORDER — AMLODIPINE BESYLATE 10 MG PO TABS
10.0000 mg | ORAL_TABLET | Freq: Every day | ORAL | 0 refills | Status: DC
  Filled ????-??-??: fill #0

## 2024-06-13 ENCOUNTER — Other Ambulatory Visit: Payer: Self-pay

## 2024-06-23 ENCOUNTER — Ambulatory Visit: Admitting: Internal Medicine

## 2024-06-23 ENCOUNTER — Telehealth: Payer: Self-pay | Admitting: Internal Medicine

## 2024-06-23 NOTE — Telephone Encounter (Signed)
 Called patient for No Show, patient rescheduled.

## 2024-06-26 ENCOUNTER — Other Ambulatory Visit: Payer: Self-pay

## 2024-06-30 ENCOUNTER — Other Ambulatory Visit: Payer: Self-pay

## 2024-07-05 ENCOUNTER — Other Ambulatory Visit: Payer: Self-pay

## 2024-07-12 NOTE — Progress Notes (Unsigned)
  Cardiology Office Note:   Date:  07/13/2024  ID:  ANGELGABRIEL Hale, DOB 1986/05/12, MRN [id number] PCP: Vicci Barnie NOVAK, MD  [facility] Providers Cardiologist:  Lynwood Schilling, MD {  History of Present Illness:   Richard Hale is a 38 y.o. male who presents for evaluation of chest pain.   He had an echo in June with an EF of 45%.   He was hospitalized in March 2020 with hypertensive urgency due to running out of his medication.  EF was 20 - 25%.  He had uncontrolled HTN.  Trop elevation was thought to be related to demand ischemia.     He has been doing well since I last saw him.  We have been titrating his meds send he is on Zepbound  and lost some weight.  He says his breathing is okay.  He does walking with his wife.  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  He has been taking his meds as prescribed by his report.  There is 1 that he said he could not afford but we cannot figure out what that is as he seems to be on all of them. The patient denies any new symptoms such as chest discomfort, neck or arm discomfort. There has been no new shortness of breath, PND or orthopnea. There have been no reported palpitations, presyncope or syncope.    ROS: As stated in the HPI and negative for all other systems.  Studies Reviewed:    EKG:     NA  Risk Assessment/Calculations:         Physical Exam:   VS:  BP (!) 154/101 (Cuff Size: Large)   Pulse (!) 105   Ht 6' 2 (1.88 m)   Wt (!) 392 lb (177.8 kg)   SpO2 96%   BMI 50.33 kg/m    Wt Readings from Last 3 Encounters:  07/13/24 (!) 392 lb (177.8 kg)  04/12/24 (!) 408 lb 3.2 oz (185.2 kg)  03/14/24 (!) 407 lb (184.6 kg)     GEN: Well nourished, well developed in no acute distress NECK: No JVD; No carotid bruits CARDIAC: RRR, no murmurs, rubs, gallops RESPIRATORY:  Clear to auscultation without rales, wheezing or rhonchi  ABDOMEN: Soft, non-tender, non-distended EXTREMITIES:  No edema; No deformity    ASSESSMENT AND PLAN:   CHRONIC SYSTOLIC HF:   I am going to check an echocardiogram now that he is on med titration.  If his ejection fraction is still reduced I will order PYP scan and begin to workup amyloid.   MORBID OBESITY:   He started to lose some weight with Zepbound  and we will continue this to maximum therapy.  TOBACCO ABUSE : He tried Chantix  but still is unable to stop smoking we talked about this again today.   HTN:    This is controlled at home in the 130s over 70s.  He is going to continue the meds as listed.  Of note he did not take his meds this morning apparently thus his blood pressure and heart rate.  SLEEP APNEA: He has a stomach sleeper and unfortunately not able to use CPAP. With me    Follow up with Damien Braver NP 4 months.  Signed, Lynwood Schilling, MD

## 2024-07-13 ENCOUNTER — Ambulatory Visit: Attending: Cardiology | Admitting: Cardiology

## 2024-07-13 ENCOUNTER — Other Ambulatory Visit: Payer: Self-pay

## 2024-07-13 ENCOUNTER — Encounter: Payer: Self-pay | Admitting: Cardiology

## 2024-07-13 VITALS — BP 154/101 | HR 105 | Ht 74.0 in | Wt 392.0 lb

## 2024-07-13 DIAGNOSIS — Z720 Tobacco use: Secondary | ICD-10-CM

## 2024-07-13 DIAGNOSIS — G4730 Sleep apnea, unspecified: Secondary | ICD-10-CM | POA: Diagnosis not present

## 2024-07-13 DIAGNOSIS — I5022 Chronic systolic (congestive) heart failure: Secondary | ICD-10-CM

## 2024-07-13 DIAGNOSIS — I10 Essential (primary) hypertension: Secondary | ICD-10-CM | POA: Diagnosis not present

## 2024-07-13 DIAGNOSIS — I420 Dilated cardiomyopathy: Secondary | ICD-10-CM

## 2024-07-13 MED ORDER — SPIRONOLACTONE 100 MG PO TABS
100.0000 mg | ORAL_TABLET | Freq: Every day | ORAL | 3 refills | Status: AC
  Filled 2024-07-13 – 2024-10-22 (×6): qty 90, 90d supply, fill #0

## 2024-07-13 MED ORDER — METOPROLOL SUCCINATE ER 100 MG PO TB24
200.0000 mg | ORAL_TABLET | Freq: Every day | ORAL | 3 refills | Status: AC
  Filled 2024-07-13 – 2024-10-22 (×4): qty 180, 90d supply, fill #0

## 2024-07-13 MED ORDER — FUROSEMIDE 80 MG PO TABS
80.0000 mg | ORAL_TABLET | Freq: Every day | ORAL | 3 refills | Status: AC
  Filled 2024-07-13 – 2024-07-26 (×2): qty 90, 90d supply, fill #0
  Filled 2024-08-21 – 2024-10-22 (×3): qty 90, 90d supply, fill #1

## 2024-07-13 MED ORDER — AMLODIPINE BESYLATE 10 MG PO TABS
10.0000 mg | ORAL_TABLET | Freq: Every day | ORAL | 3 refills | Status: AC
  Filled 2024-07-13 – 2024-08-31 (×3): qty 90, 90d supply, fill #0
  Filled 2024-10-22: qty 90, 90d supply, fill #1

## 2024-07-13 NOTE — Patient Instructions (Addendum)
 Medication Instructions:  Refilled meds  Your physician recommends that you continue on your current medications as directed. Please refer to the Current Medication list given to you today.  *If you need a refill on your cardiac medications before your next appointment, please call your pharmacy*  Lab Work: NONE If you have labs (blood work) drawn today and your tests are completely normal, you will receive your results only by: MyChart Message (if you have MyChart) OR A paper copy in the mail If you have any lab test that is abnormal or we need to change your treatment, we will call you to review the results.  Testing/Procedures: Echocardiogram Your physician has requested that you have an echocardiogram. Echocardiography is a painless test that uses sound waves to create images of your heart. It provides your doctor with information about the size and shape of your heart and how well your heart's chambers and valves are working. This procedure takes approximately one hour. There are no restrictions for this procedure. Please do NOT wear cologne, perfume, aftershave, or lotions (deodorant is allowed). Please arrive 15 minutes prior to your appointment time.  Please note: We ask at that you not bring children with you during ultrasound (echo/ vascular) testing. Due to room size and safety concerns, children are not allowed in the ultrasound rooms during exams. Our front office staff cannot provide observation of children in our lobby area while testing is being conducted. An adult accompanying a patient to their appointment will only be allowed in the ultrasound room at the discretion of the ultrasound technician under special circumstances. We apologize for any inconvenience.   Follow-Up: At [facility], you and your health needs are our priority.  As part of our continuing mission to provide you with exceptional heart care, our providers are all part of one team.  This team  includes your primary Cardiologist (physician) and Advanced Practice Providers or APPs (Physician Assistants and Nurse Practitioners) who all work together to provide you with the care you need, when you need it.  Your next appointment:   4 months  Provider:   Damien Braver, NP  We recommend signing up for the patient portal called MyChart.  Sign up information is provided on this After Visit Summary.  MyChart is used to connect with patients for Virtual Visits (Telemedicine).  Patients are able to view lab/test results, encounter notes, upcoming appointments, etc.  Non-urgent messages can be sent to your provider as well.   To learn more about what you can do with MyChart, go to [email or web address].

## 2024-07-19 ENCOUNTER — Encounter: Payer: Self-pay | Admitting: Cardiology

## 2024-07-21 ENCOUNTER — Other Ambulatory Visit: Payer: Self-pay

## 2024-07-25 ENCOUNTER — Other Ambulatory Visit: Payer: Self-pay

## 2024-07-26 ENCOUNTER — Other Ambulatory Visit: Payer: Self-pay

## 2024-07-26 ENCOUNTER — Other Ambulatory Visit: Payer: Self-pay | Admitting: Nurse Practitioner

## 2024-07-27 ENCOUNTER — Encounter: Payer: Self-pay | Admitting: Pharmacist

## 2024-07-27 ENCOUNTER — Encounter: Payer: Self-pay | Admitting: Cardiology

## 2024-07-27 DIAGNOSIS — G4733 Obstructive sleep apnea (adult) (pediatric): Secondary | ICD-10-CM

## 2024-07-28 ENCOUNTER — Other Ambulatory Visit (HOSPITAL_COMMUNITY): Payer: Self-pay

## 2024-07-28 MED ORDER — ZEPBOUND 7.5 MG/0.5ML ~~LOC~~ SOAJ
7.5000 mg | SUBCUTANEOUS | 0 refills | Status: DC
  Filled 2024-07-28: qty 2, 28d supply, fill #0

## 2024-07-28 NOTE — Telephone Encounter (Signed)
 2 my chart messages sent

## 2024-07-28 NOTE — Addendum Note (Signed)
 Addended by: DARRELL BRUCKNER on: 07/28/2024 12:02 PM   Modules accepted: Orders

## 2024-08-02 ENCOUNTER — Other Ambulatory Visit: Payer: Self-pay

## 2024-08-07 ENCOUNTER — Other Ambulatory Visit: Payer: Self-pay

## 2024-08-07 ENCOUNTER — Ambulatory Visit: Attending: Internal Medicine | Admitting: Internal Medicine

## 2024-08-07 ENCOUNTER — Encounter: Payer: Self-pay | Admitting: Internal Medicine

## 2024-08-07 VITALS — BP 126/85 | HR 96 | Ht 74.0 in | Wt 395.0 lb

## 2024-08-07 DIAGNOSIS — Z2821 Immunization not carried out because of patient refusal: Secondary | ICD-10-CM

## 2024-08-07 DIAGNOSIS — Z6841 Body Mass Index (BMI) 40.0 and over, adult: Secondary | ICD-10-CM

## 2024-08-07 DIAGNOSIS — I5022 Chronic systolic (congestive) heart failure: Secondary | ICD-10-CM

## 2024-08-07 DIAGNOSIS — I110 Hypertensive heart disease with heart failure: Secondary | ICD-10-CM

## 2024-08-07 DIAGNOSIS — Z7985 Long-term (current) use of injectable non-insulin antidiabetic drugs: Secondary | ICD-10-CM

## 2024-08-07 DIAGNOSIS — Z23 Encounter for immunization: Secondary | ICD-10-CM

## 2024-08-07 DIAGNOSIS — I10 Essential (primary) hypertension: Secondary | ICD-10-CM

## 2024-08-07 DIAGNOSIS — R7303 Prediabetes: Secondary | ICD-10-CM

## 2024-08-07 DIAGNOSIS — G4733 Obstructive sleep apnea (adult) (pediatric): Secondary | ICD-10-CM

## 2024-08-07 DIAGNOSIS — F17210 Nicotine dependence, cigarettes, uncomplicated: Secondary | ICD-10-CM

## 2024-08-07 DIAGNOSIS — F17200 Nicotine dependence, unspecified, uncomplicated: Secondary | ICD-10-CM

## 2024-08-07 LAB — GLUCOSE, POCT (MANUAL RESULT ENTRY): POC Glucose: 120 mg/dL — AB (ref 70–99)

## 2024-08-07 LAB — POCT GLYCOSYLATED HEMOGLOBIN (HGB A1C): HbA1c, POC (prediabetic range): 5.9 % (ref 5.7–6.4)

## 2024-08-07 MED ORDER — NICOTINE 14 MG/24HR TD PT24
14.0000 mg | MEDICATED_PATCH | Freq: Every day | TRANSDERMAL | 2 refills | Status: AC
  Filled 2024-08-07: qty 28, 28d supply, fill #0
  Filled 2024-10-06 – 2024-10-22 (×2): qty 28, 28d supply, fill #1

## 2024-08-07 NOTE — Progress Notes (Signed)
 Patient ID: Richard Hale, male    DOB: [birth year]  MRN: [id number]  CC: Hypertension (HTN & pre-diabetes f/u./No questions / concerns/Tdap vax administered on 08/07/2024 - C.A.)   Subjective: Richard Hale is a 38 y.o. male who presents for chronic ds management. His concerns today include:  Patient with history of HTN, preDM, cardiomyopathy (EF 45% in 2020, 20-25% 06/2022), OSA on CPAP, morbid obesity, tob dep   Discussed the use of AI scribe software for clinical note transcription with the patient, who gave verbal consent to proceed.  History of Present Illness Richard Hale is a 38 year old male with hypertension, congestive heart failure, and prediabetes who presents for follow-up of his chronic medical conditions.  HTN/CHF: His blood pressure was elevated at 156/94 today, with a goal of 130/80 or lower. At home, his blood pressure readings vary, with the lowest being 134/84 and occasionally reaching 145/90. He takes amlodipine  10 mg, furosemide  80 mg, spironolactone  100 mg, and Entresto  97/103 mg twice daily, as well as Jardiance  10 mg. He checks his blood pressure every other week and notes that it is usually controlled except in the mornings before taking his medication. He limits salt intake and reports that when his fluid builds up, it is hard to breathe.  PreDM/Obesity: He has a history of prediabetes and obesity, currently weighing 395 pounds, down from 421 pounds. He is on Zepbound  that was started by cardiology, recently increased to 7.5 mg, which has decreased his appetite and led to weight loss. He experiences side effects such as nausea, stomach ache, diarrhea, and occasional vomiting, primarily on the first day of any dose increase; after that he is good. Due to decreased appetite, he sometimes goes a day without eating, only drinking water.  Tob dep: He smokes about five cigarettes a day but often does not finish a whole cigarette. He has tried Chantix  without  success and has not tried nicotine  patches. .  OSA: He has not been using his CPAP machine due to discomfort and difficulty sleeping with it. He has tried different masks without success.    Patient Active Problem List   Diagnosis Date Noted   Elevated troponin 07/02/2022   Influenza vaccine refused 11/21/2020   COVID-19 vaccination declined 11/21/2020   CHF (congestive heart failure), NYHA class II, chronic, systolic (HCC) 11/21/2020   Tobacco dependence 11/21/2020   Cardiomyopathy (HCC) 08/28/2019   Chest pain of uncertain etiology 08/28/2019   Acute coronary syndrome (HCC) 04/06/2019   Tobacco abuse 04/06/2019   Hypokalemia 04/06/2019   Hypertensive urgency 12/14/2018   Essential hypertension 09/05/2018   Obstructive sleep apnea 09/05/2018   Class 3 severe obesity due to excess calories with serious comorbidity and body mass index (BMI) of 50.0 to 59.9 in adult [facility]) 09/05/2018   Prediabetes 09/05/2018     Current Outpatient Medications on File Prior to Visit  Medication Sig Dispense Refill   amLODipine  (NORVASC ) 10 MG tablet Take 1 tablet (10 mg total) by mouth daily. 90 tablet 3   empagliflozin  (JARDIANCE ) 10 MG TABS tablet Take 1 tablet (10 mg total) by mouth daily. 30 tablet 6   furosemide  (LASIX ) 80 MG tablet Take 1 tablet (80 mg total) by mouth daily. 90 tablet 3   metoprolol  succinate (TOPROL  XL) 100 MG 24 hr tablet Take 2 tablets (200 mg total) by mouth daily. 180 tablet 3   sacubitril -valsartan  (ENTRESTO ) 97-103 MG Take 1 tablet by mouth 2 (two) times daily. 60 tablet 6  spironolactone  (ALDACTONE ) 100 MG tablet Take 1 tablet (100 mg total) by mouth daily. 90 tablet 3   tirzepatide  (ZEPBOUND ) 7.5 MG/0.5ML Pen Inject 7.5 mg into the skin once a week. 2 mL 0   triamcinolone  cream (KENALOG ) 0.1 % Apply 1 Application topically 2 (two) times daily. 30 g 0   Varenicline  Tartrate, Starter, (CHANTIX  STARTING MONTH PAK) 0.5 MG X 11 & 1 MG X 42 TBPK Take as directed 53 each 0    No current facility-administered medications on file prior to visit.    Allergies  Allergen Reactions   Lisinopril Cough    Social History   Socioeconomic History   Marital status: Married    Spouse name: Not on file   Number of children: Not on file   Years of education: Not on file   Highest education level: 11th grade  Occupational History   Not on file  Tobacco Use   Smoking status: Every Day    Current packs/day: 0.25    Types: Cigarettes   Smokeless tobacco: Never   Tobacco comments:    5 cigs a day  Vaping Use   Vaping status: Never Used  Substance and Sexual Activity   Alcohol use: Yes    Comment: Occassionally.   Drug use: Never   Sexual activity: Yes  Other Topics Concern   Not on file  Social History Narrative   Lives with girlfriend and her kids.  He has three children.  Works at a radio broadcast assistant.     Social Drivers of Corporate Investment Banker Strain: High Risk (08/07/2024)   Overall Financial Resource Strain (CARDIA)    Difficulty of Paying Living Expenses: Hard  Food Insecurity: No Food Insecurity (08/07/2024)   Hunger Vital Sign    Worried About Running Out of Food in the Last Year: Never true    Ran Out of Food in the Last Year: Never true  Transportation Needs: No Transportation Needs (05/17/2023)   PRAPARE - Administrator, Civil Service (Medical): No    Hale of Transportation (Non-Medical): No  Physical Activity: Inactive (08/07/2024)   Exercise Vital Sign    Days of Exercise per Week: 0 days    Minutes of Exercise per Session: 0 min  Stress: No Stress Concern Present (08/07/2024)   [employer] of Occupational Health - Occupational Stress Questionnaire    Feeling of Stress: Not at all  Social Connections: Moderately Isolated (08/07/2024)   Social Connection and Isolation Panel    Frequency of Communication with Friends and Family: Three times a week    Frequency of Social Gatherings with Friends and Family: More than  three times a week    Attends Religious Services: Never    Database Administrator or Organizations: No    Attends Banker Meetings: Never    Marital Status: Married  Catering Manager Violence: Not At Risk (08/07/2024)   Humiliation, Afraid, Rape, and Kick questionnaire    Fear of Current or Ex-Partner: No    Emotionally Abused: No    Physically Abused: No    Sexually Abused: No    Family History  Problem Relation Age of Onset   Hypertension Mother    Heart failure Mother    Diabetes Maternal Grandmother    CAD Father 55       Heart attack and Stroke    Past Surgical History:  Procedure Laterality Date   NOSE SURGERY      ROS: Review of Systems  Negative except as stated above  PHYSICAL EXAM: BP 126/85   Pulse 96   Ht 6' 2 (1.88 m)   Wt (!) 395 lb (179.2 kg)   SpO2 97%   BMI 50.71 kg/m   Wt Readings from Last 3 Encounters:  08/07/24 (!) 395 lb (179.2 kg)  07/13/24 (!) 392 lb (177.8 kg)  04/12/24 (!) 408 lb 3.2 oz (185.2 kg)    Physical Exam   General appearance - alert, well appearing, morbidly obese AAM and in no distress Mental status - normal mood, behavior, speech, dress, motor activity, and thought processes Neck - supple, no significant adenopathy Chest - clear to auscultation, no wheezes, rales or rhonchi, symmetric air entry Heart - normal rate, regular rhythm, normal S1, S2, no murmurs, rubs, clicks or gallops Extremities - peripheral pulses normal, no pedal edema, no clubbing or cyanosis     Latest Ref Rng & Units 04/12/2024   10:18 AM 04/14/2023    3:46 PM 07/03/2022    6:19 AM  CMP  Glucose 70 - 99 mg/dL 89  90  885   BUN 6 - 20 mg/dL 15  15  15    Creatinine 0.76 - 1.27 mg/dL 8.82  8.99  8.90   Sodium 134 - 144 mmol/L 141  144  139   Potassium 3.5 - 5.2 mmol/L 4.3  3.8  3.5   Chloride 96 - 106 mmol/L 103  106  107   CO2 20 - 29 mmol/L 20   26   Calcium 8.7 - 10.2 mg/dL 9.8  9.0  8.9   Total Protein 6.0 - 8.5 g/dL  7.0    Total  Bilirubin 0.0 - 1.2 mg/dL  0.4    Alkaline Phos 44 - 121 IU/L  92    AST 0 - 40 IU/L  21     Lipid Panel     Component Value Date/Time   CHOL 121 11/24/2021 1043   TRIG 83 11/24/2021 1043   HDL 54 11/24/2021 1043   CHOLHDL 2.2 11/24/2021 1043   CHOLHDL 3.1 04/07/2019 0856   VLDL 29 04/07/2019 0856   LDLCALC 51 11/24/2021 1043    CBC    Component Value Date/Time   WBC 7.2 04/14/2023 1546   WBC 6.3 07/03/2022 0619   RBC 4.73 04/14/2023 1546   RBC 4.82 07/03/2022 0619   HGB 13.6 04/14/2023 1546   HCT 42.2 04/14/2023 1546   PLT 280 04/14/2023 1546   MCV 89 04/14/2023 1546   MCH 28.8 04/14/2023 1546   MCH 30.1 07/03/2022 0619   MCHC 32.2 04/14/2023 1546   MCHC 32.7 07/03/2022 0619   RDW 12.9 04/14/2023 1546   LYMPHSABS 3.0 04/14/2023 1546   MONOABS 0.6 07/02/2022 0349   EOSABS 0.5 (H) 04/14/2023 1546   BASOSABS 0.0 04/14/2023 1546    ASSESSMENT AND PLAN: 1. CHF (congestive heart failure), NYHA class II, chronic, systolic (HCC) (Primary) Stable and compensated.  Continue furosemide  80 mg daily, spironolactone  100 mg daily, metoprolol  XL 100 mg daily, Entresto  97/103 mg twice a day and Jardiance  10 mg daily.  2. Essential hypertension Repeat blood pressure closer to goal.  Encouraged him to monitor blood pressure at home twice a week with goal being 130/80 or lower.  Continue amlodipine  10 mg daily, spironolactone  100 mg daily, Entresto  97/103 mg twice a day and metoprolol  XL 100 mg daily.  3. Morbid obesity with BMI of 50.0-59.9, adult (HCC) 4. Prediabetes Patient achieving good weight loss with Mounjaro .  However he  experiences some GI upset when he takes the first dose of any increased.  I recommend that he may want to stay on each dose increase for at least 2 months to give his system time to get used to it before increasing to the next dose.  Patient is agreeable to this plan.  He is currently on the 7.5 mg that was started earlier this month. - POCT glucose (manual  entry) - POCT glycosylated hemoglobin (Hb A1C)  5. Tobacco dependence Strongly advised to quit. Patient wanting to quit. He is aware of health risks associated with smoking. Discussed methods to help him quit. He is willing to try the nicotine  patches.  We will start him at the intermediate dose of 14 mg daily.  Went over with him how to use the patches. Reassess progress on subsequent visit. 2 minutes spent on counseling.  6. Influenza vaccination declined Recommended.  Patient declined.  7. OSA (obstructive sleep apnea) Patient intolerant of CPAP despite trying different mask.  He would not be a good candidate for inspire at this time due to his body mass index being above 35.  Advised that this is something we can explore down the road if he is successful in getting his body mass index down more.  8. Need for Tdap vaccination Given today    Patient was given the opportunity to ask questions.  Patient verbalized understanding of the plan and was able to repeat key elements of the plan.   This documentation was completed using Paediatric nurse.  Any transcriptional errors are unintentional.  Orders Placed This Encounter  Procedures   Tdap vaccine greater than or equal to 7yo IM   POCT glucose (manual entry)   POCT glycosylated hemoglobin (Hb A1C)     Requested Prescriptions   Signed Prescriptions Disp Refills   nicotine  (NICODERM CQ  - DOSED IN MG/24 HOURS) 14 mg/24hr patch 28 patch 2    Sig: Place 1 patch (14 mg total) onto the skin daily.    Return in about 4 months (around 12/08/2024).  Barnie Louder, MD, FACP

## 2024-08-07 NOTE — Patient Instructions (Signed)
  VISIT SUMMARY: Today, you came in for a follow-up visit to manage your chronic conditions, including hypertension, congestive heart failure, prediabetes, and obesity. We discussed your current medications, lifestyle habits, and strategies to improve your health. Your blood pressure was elevated, and we reviewed your home readings. We also talked about your weight loss progress and the side effects of your medication. Additionally, we addressed your smoking habits and difficulties with using your CPAP machine.  YOUR PLAN: -CONGESTIVE HEART FAILURE: Congestive heart failure means your heart is not pumping blood as well as it should. Continue taking Entresto , furosemide , spironolactone , and Jardiance  as prescribed. Limit your salt intake to help manage fluid retention. Complete your scheduled echocardiogram on November 12 to assess your heart function and determine if you are eligible for weight loss surgery.  -HYPERTENSION: Hypertension, or high blood pressure, means the force of the blood against your artery walls is too high. Continue taking amlodipine , furosemide , spironolactone , and Entresto . Recheck your blood pressure at home regularly and continue to limit your salt intake.  -OBESITY: Obesity means having an excessive amount of body fat. Continue taking Zepbound  7.5 mg weekly. Keep up with regular physical activity and portion control. We will consider bariatric surgery after your echocardiogram results and heart function assessment.  -PREDIABETES: Prediabetes means your blood sugar levels are higher than normal but not high enough to be classified as diabetes. Continue taking Zepbound  7.5 mg weekly and consider extending the dose titration interval to every two months. Maintain a balanced diet with adequate protein intake to prevent muscle mass loss.  -OBSTRUCTIVE SLEEP APNEA: Obstructive sleep apnea is a condition where your breathing stops and starts during sleep. Continue your weight loss  efforts to reduce your BMI, which may help you qualify for the Inspire implant in the future.  -TOBACCO USE DISORDER: Tobacco use disorder means you have difficulty stopping smoking. Start using nicotine  patches, beginning with the 14 mg strength, applied daily. Set a quit date and try to stop buying cigarettes.  -GENERAL HEALTH MAINTENANCE: You declined the flu shot but agreed to receive the Tdap vaccine. We will administer the Tdap vaccine today.  INSTRUCTIONS: Please complete your echocardiogram on November 12. Recheck your blood pressure at home regularly and keep a log of your readings. Set a quit date for smoking and start using nicotine  patches as discussed. Continue with your current medications and lifestyle changes, and follow up with us  as needed.                      Contains text generated by Abridge.                                 Contains text generated by Abridge.

## 2024-08-11 ENCOUNTER — Other Ambulatory Visit: Payer: Self-pay

## 2024-08-15 ENCOUNTER — Other Ambulatory Visit: Payer: Self-pay

## 2024-08-21 ENCOUNTER — Other Ambulatory Visit: Payer: Self-pay | Admitting: Cardiology

## 2024-08-21 ENCOUNTER — Other Ambulatory Visit: Payer: Self-pay

## 2024-08-21 DIAGNOSIS — G4733 Obstructive sleep apnea (adult) (pediatric): Secondary | ICD-10-CM

## 2024-08-21 MED ORDER — ZEPBOUND 7.5 MG/0.5ML ~~LOC~~ SOAJ
7.5000 mg | SUBCUTANEOUS | 0 refills | Status: DC
  Filled 2024-08-21 – 2024-08-31 (×2): qty 2, 28d supply, fill #0

## 2024-08-23 ENCOUNTER — Ambulatory Visit (HOSPITAL_COMMUNITY)
Admission: RE | Admit: 2024-08-23 | Discharge: 2024-08-23 | Disposition: A | Discharge status: Home / Self Care | Source: Ambulatory Visit | Attending: Internal Medicine | Admitting: Internal Medicine

## 2024-08-23 DIAGNOSIS — I420 Dilated cardiomyopathy: Secondary | ICD-10-CM | POA: Diagnosis not present

## 2024-08-23 LAB — ECHOCARDIOGRAM COMPLETE
Area-P 1/2: 10.54 cm2
S' Lateral: 6.1 cm

## 2024-08-23 MED ORDER — PERFLUTREN LIPID MICROSPHERE
1.0000 mL | INTRAVENOUS | Status: AC | PRN
  Administered 2024-08-23: 1 mL via INTRAVENOUS

## 2024-08-24 ENCOUNTER — Other Ambulatory Visit: Payer: Self-pay

## 2024-08-26 ENCOUNTER — Ambulatory Visit: Payer: Self-pay | Admitting: Cardiology

## 2024-08-29 ENCOUNTER — Other Ambulatory Visit: Payer: Self-pay

## 2024-08-30 ENCOUNTER — Other Ambulatory Visit: Payer: Self-pay

## 2024-08-31 ENCOUNTER — Other Ambulatory Visit: Payer: Self-pay

## 2024-09-01 ENCOUNTER — Other Ambulatory Visit: Payer: Self-pay

## 2024-09-09 ENCOUNTER — Encounter: Payer: Self-pay | Admitting: Cardiology

## 2024-09-09 DIAGNOSIS — I519 Heart disease, unspecified: Secondary | ICD-10-CM

## 2024-09-18 ENCOUNTER — Other Ambulatory Visit: Payer: Self-pay | Admitting: Cardiology

## 2024-09-18 DIAGNOSIS — I519 Heart disease, unspecified: Secondary | ICD-10-CM

## 2024-09-19 ENCOUNTER — Inpatient Hospital Stay (HOSPITAL_COMMUNITY): Admission: RE | Admit: 2024-09-19 | Discharge: 2024-09-19 | Attending: Cardiology | Admitting: Cardiology

## 2024-09-19 ENCOUNTER — Encounter: Payer: Self-pay | Admitting: Cardiology

## 2024-09-19 DIAGNOSIS — I519 Heart disease, unspecified: Secondary | ICD-10-CM

## 2024-09-19 MED ORDER — TC 99M HYDROXYMETHYLENE DIPHOSPHONATE INJECTION
19.9000 | Freq: Once | INTRAVENOUS | Status: AC
  Administered 2024-09-19: 19.9 via INTRAVENOUS

## 2024-09-21 ENCOUNTER — Ambulatory Visit: Payer: Self-pay | Admitting: Cardiology

## 2024-10-06 ENCOUNTER — Other Ambulatory Visit: Payer: Self-pay

## 2024-10-06 ENCOUNTER — Other Ambulatory Visit: Payer: Self-pay | Admitting: Cardiology

## 2024-10-06 DIAGNOSIS — G4733 Obstructive sleep apnea (adult) (pediatric): Secondary | ICD-10-CM

## 2024-10-06 MED ORDER — ZEPBOUND 10 MG/0.5ML ~~LOC~~ SOAJ
10.0000 mg | SUBCUTANEOUS | 0 refills | Status: AC
  Filled 2024-10-06 – 2024-10-22 (×2): qty 2, 28d supply, fill #0

## 2024-10-10 ENCOUNTER — Other Ambulatory Visit: Payer: Self-pay

## 2024-10-17 ENCOUNTER — Other Ambulatory Visit: Payer: Self-pay

## 2024-10-18 ENCOUNTER — Other Ambulatory Visit: Payer: Self-pay

## 2024-10-23 ENCOUNTER — Other Ambulatory Visit: Payer: Self-pay

## 2024-10-24 ENCOUNTER — Ambulatory Visit: Admitting: Internal Medicine

## 2024-10-25 ENCOUNTER — Other Ambulatory Visit: Payer: Self-pay

## 2024-12-08 ENCOUNTER — Ambulatory Visit: Admitting: Internal Medicine
